# Patient Record
Sex: Male | Born: 1965 | Race: Black or African American | Hispanic: No | Marital: Married | Smoking: Never smoker
Health system: Southern US, Community
[De-identification: ages and names within clinical notes are randomized; demographics above are authoritative.]

## PROBLEM LIST (undated history)

## (undated) DIAGNOSIS — I10 Essential (primary) hypertension: Secondary | ICD-10-CM

---

## 1999-04-13 HISTORY — PX: DG HAND LEFT COMPLETE (ARMC HX): HXRAD1529

## 2007-01-06 ENCOUNTER — Other Ambulatory Visit: Payer: Self-pay

## 2007-01-06 ENCOUNTER — Emergency Department: Payer: Self-pay | Admitting: Emergency Medicine

## 2008-03-24 ENCOUNTER — Emergency Department: Payer: Self-pay | Admitting: Internal Medicine

## 2009-01-24 ENCOUNTER — Ambulatory Visit: Payer: Self-pay | Admitting: Internal Medicine

## 2013-12-10 ENCOUNTER — Emergency Department: Payer: Self-pay | Admitting: Emergency Medicine
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
## 2016-12-17 ENCOUNTER — Encounter: Payer: Self-pay | Admitting: Emergency Medicine

## 2016-12-17 ENCOUNTER — Emergency Department
Admission: EM | Admit: 2016-12-17 | Discharge: 2016-12-17 | Disposition: A | Discharge status: Home / Self Care | Payer: Self-pay | Attending: Emergency Medicine | Admitting: Emergency Medicine

## 2016-12-17 DIAGNOSIS — Y999 Unspecified external cause status: Secondary | ICD-10-CM | POA: Insufficient documentation

## 2016-12-17 DIAGNOSIS — Y939 Activity, unspecified: Secondary | ICD-10-CM | POA: Insufficient documentation

## 2016-12-17 DIAGNOSIS — S20461A Insect bite (nonvenomous) of right back wall of thorax, initial encounter: Secondary | ICD-10-CM | POA: Insufficient documentation

## 2016-12-17 DIAGNOSIS — I10 Essential (primary) hypertension: Secondary | ICD-10-CM | POA: Insufficient documentation

## 2016-12-17 DIAGNOSIS — R42 Dizziness and giddiness: Secondary | ICD-10-CM | POA: Insufficient documentation

## 2016-12-17 DIAGNOSIS — Y929 Unspecified place or not applicable: Secondary | ICD-10-CM | POA: Insufficient documentation

## 2016-12-17 DIAGNOSIS — Z9119 Patient's noncompliance with other medical treatment and regimen: Secondary | ICD-10-CM | POA: Insufficient documentation

## 2016-12-17 DIAGNOSIS — Z91199 Patient's noncompliance with other medical treatment and regimen due to unspecified reason: Secondary | ICD-10-CM

## 2016-12-17 DIAGNOSIS — W57XXXA Bitten or stung by nonvenomous insect and other nonvenomous arthropods, initial encounter: Secondary | ICD-10-CM | POA: Insufficient documentation

## 2016-12-17 HISTORY — DX: Essential (primary) hypertension: I10

## 2016-12-17 LAB — CBC
HEMATOCRIT: 52.4 % — AB (ref 40.0–52.0)
Hemoglobin: 18.3 g/dL — ABNORMAL HIGH (ref 13.0–18.0)
MCH: 31.7 pg (ref 26.0–34.0)
MCHC: 35 g/dL (ref 32.0–36.0)
MCV: 90.6 fL (ref 80.0–100.0)
Platelets: 207 10*3/uL (ref 150–440)
RBC: 5.78 MIL/uL (ref 4.40–5.90)
RDW: 13.8 % (ref 11.5–14.5)
WBC: 8.4 10*3/uL (ref 3.8–10.6)

## 2016-12-17 LAB — BASIC METABOLIC PANEL
Anion gap: 7 (ref 5–15)
BUN: 10 mg/dL (ref 6–20)
CHLORIDE: 103 mmol/L (ref 101–111)
CO2: 29 mmol/L (ref 22–32)
Calcium: 9.7 mg/dL (ref 8.9–10.3)
Creatinine, Ser: 1.17 mg/dL (ref 0.61–1.24)
GFR calc Af Amer: >60 mL/min (ref >60)
GFR calc non Af Amer: >60 mL/min (ref >60)
GLUCOSE: 101 mg/dL — AB (ref 65–99)
POTASSIUM: 4.1 mmol/L (ref 3.5–5.1)
Sodium: 139 mmol/L (ref 135–145)

## 2016-12-17 MED ORDER — HYDROXYZINE HCL 25 MG PO TABS: 25.0000 mg | ORAL_TABLET | Freq: Three times a day (TID) | ORAL | 0 refills | Status: AC | PRN

## 2016-12-17 MED ORDER — HYDROCHLOROTHIAZIDE 25 MG PO TABS: 25.0000 mg | ORAL_TABLET | Freq: Every day | ORAL | 0 refills | Status: AC

## 2016-12-17 MED ORDER — CEPHALEXIN 500 MG PO CAPS: 500.0000 mg | ORAL_CAPSULE | Freq: Three times a day (TID) | ORAL | 0 refills | Status: AC

## 2016-12-17 MED ORDER — TRIAMCINOLONE ACETONIDE 0.1 % EX CREA: TOPICAL_CREAM | CUTANEOUS | 0 refills | Status: AC

## 2016-12-17 NOTE — ED Provider Notes (Signed)
[facility] Emergency Department Provider Note  ____________________________________________   First MD Initiated Contact with Patient 12/17/16 1331     (approximate)  I have reviewed the triage vital signs and the nursing notes.   HISTORY  Chief Complaint Insect Bite and Hypertension    HPI Daniel Daugherty is a 51 y.o. male is here with complaint of pulling a tick off his back last week. Patient states that a friend took a. These are simple the tick off. He kept the tick and is looked at it under a microscope and it appears that the tick was taken out intact. Patient is concerned because he has a scabbed area on his back and also some redness in that area. He denies any headache, fever, joint aches or other rashes. Patient was noted to have had elevated blood pressure while in triage. Patient states that he was diagnosed with hypertension in 2004 when he was incarcerated but since that time has not taken any blood pressure medication and has not continued his diagnosis of hypertension. He states that he was seen by multiple doctors while he was trying to get disability and was not told that he has hypertension. Currently he rates his pain at the tick site as a 6 out of 10. He also states that it itches causing him to scratch at it.   Past Medical History:  Diagnosis Date  . Hypertension     There are no active problems to display for this patient.   History reviewed. No pertinent surgical history.  Prior to Admission medications   Medication Sig Start Date End Date Taking? Authorizing Provider  cephALEXin (KEFLEX) 500 MG capsule Take 1 capsule (500 mg total) by mouth 3 (three) times daily. 12/17/16   Tommi RumpsSummers, Rakwon Letourneau L, PA-C  hydrochlorothiazide (HYDRODIURIL) 25 MG tablet Take 1 tablet (25 mg total) by mouth daily. 12/17/16   Tommi RumpsSummers, Claudy Abdallah L, PA-C  hydrOXYzine (ATARAX/VISTARIL) 25 MG tablet Take 1 tablet (25 mg total) by mouth 3 (three) times daily as needed.  12/17/16   Tommi RumpsSummers, Shonique Pelphrey L, PA-C  triamcinolone cream (KENALOG) 0.1 % Apply to area twice a day as needed for itching. 12/17/16   Tommi RumpsSummers, Daysen Gundrum L, PA-C    Allergies Patient has no known allergies.  History reviewed. No pertinent family history.  Social History Social History  Substance Use Topics  . Smoking status: Never Smoker  . Smokeless tobacco: Never Used  . Alcohol use 3.6 oz/week    6 Cans of beer per week    Review of Systems  Constitutional: No fever/chills Cardiovascular: Denies chest pain. Respiratory: Denies shortness of breath. Gastrointestinal:   No nausea, no vomiting.  Musculoskeletal: Negative for back pain. Skin: Positive for tick bite, erythema, itching. Neurological: Negative for headaches, focal weakness or numbness.   ____________________________________________   PHYSICAL EXAM:  VITAL SIGNS: ED Triage Vitals [12/17/16 1126]  Enc Vitals Group     BP (!) 181/114     Pulse Rate 81     Resp 16     Temp 99.1 F (37.3 C)     Temp Source Oral     SpO2 98 %     Weight 198 lb (89.8 kg)     Height 5\' 6"  (1.676 m)     Head Circumference      Peak Flow      Pain Score      Pain Loc      Pain Edu?      Excl. in  GC?     Constitutional: Alert and oriented. Well appearing and in no acute distress. Eyes: Conjunctivae are normal. Head: Atraumatic. Nose: No congestion/rhinnorhea. Neck: No stridor.   Cardiovascular: Normal rate, regular rhythm. Grossly normal heart sounds.  Good peripheral circulation. Respiratory: Normal respiratory effort.  No retractions. Lungs CTAB. Gastrointestinal: Soft and nontender. No distention.  Musculoskeletal: Moves upper and lower extremities without any difficulty. Normal gait was noted. Neurologic:  Normal speech and language. No gross focal neurologic deficits are appreciated. No gait instability. Skin:  Skin is warm, dry.  Right posterior trunk area has a small 1.5 cm scabbed area with erythema surrounding it.  Patient does have tick present with him and this was examined and found to have all parts present. Area does not appear to be a tick related rash but more cellulitis from patient having tick removed. There is no active drainage. There is no soft tissue swelling present. No other rashes were noted. Psychiatric: Mood and affect are normal. Speech and behavior are normal.  ____________________________________________   LABS (all labs ordered are listed, but only abnormal results are displayed)  Labs Reviewed  BASIC METABOLIC PANEL - Abnormal; Notable for the following:       Result Value   Glucose, Bld 101 (*)    All other components within normal limits  CBC - Abnormal; Notable for the following:    Hemoglobin 18.3 (*)    HCT 52.4 (*)    All other components within normal limits  URINALYSIS, COMPLETE (UACMP) WITH MICROSCOPIC  CBG MONITORING, ED   ____________________________________________  EKG Normal sinus rhythm with a ventricular rate of 85. Possible left atrial enlargement. PR interval 122, QRS duration 88.   EKG was also reviewed by Dr. Mayford Knife.   PROCEDURES  Procedure(s) performed: None  Procedures  Critical Care performed: No  ____________________________________________   INITIAL IMPRESSION / ASSESSMENT AND PLAN / ED COURSE  Pertinent labs & imaging results that were available during my care of the patient were reviewed by me and considered in my medical decision making (see chart for details).  Repeat blood pressure was 180/100. We discussed this and patient denies any history of hypertension and feels that it is his tick bite that is causing his elevated blood pressure. He refuses treatment for his hypertension. He also implies that he has not feel that he needs to follow up with any doctor about his blood pressure. He again relates that in 2004 he did not have hypertension. Patient was told that he would be given a prescription for hydrochlorothiazide to take 1  daily and to also follow-up with one of the many clinics  available [facility]. Patient was given Keflex 500 mg for localized  skin infection related to his tick bite. He was also given Atarax for itching and Kenalog cream to apply to the area.    ____________________________________________   FINAL CLINICAL IMPRESSION(S) / ED DIAGNOSES  Final diagnoses:  Tick bite, initial encounter  Elevated blood pressure reading with diagnosis of hypertension  Non-compliant patient      NEW MEDICATIONS STARTED DURING THIS VISIT:  Discharge Medication List as of 12/17/2016  2:55 PM    START taking these medications   Details  cephALEXin (KEFLEX) 500 MG capsule Take 1 capsule (500 mg total) by mouth 3 (three) times daily., Starting Thu 12/17/2016, Print    hydrochlorothiazide (HYDRODIURIL) 25 MG tablet Take 1 tablet (25 mg total) by mouth daily., Starting Thu 12/17/2016, Print  hydrOXYzine (ATARAX/VISTARIL) 25 MG tablet Take 1 tablet (25 mg total) by mouth 3 (three) times daily as needed., Starting Thu 12/17/2016, Print    triamcinolone cream (KENALOG) 0.1 % Apply to area twice a day as needed for itching., Print         Note:  This document was prepared using Dragon voice recognition software and may include unintentional dictation errors.    Tommi Rumps, PA-C 12/17/16 1645    Sharman Cheek, MD 12/20/16 [phone]

## 2016-12-17 NOTE — ED Notes (Signed)
See triage note  States he developed some dizziness and just not feeling well since a tick bite last week  Denies any fever or rash  Afebrile on arrival

## 2016-12-17 NOTE — Discharge Instructions (Signed)
Follow-up with one of the many clinics listed on your paper. [facility] also available to [facility] and is free. Begin taking Keflex 500 mg 3 times a day for skin infection. Atarax 25 mg 3 times a day as needed for itching.Use cream to apply topically to the tick bite for itching. Also hydrochlorothiazide 25 mg one daily for blood pressure issues. Your  blood pressure is high today. Blood pressure today was 180/110. This is too high not to be considered hypertension. He should follow-up with one of clinics listed on your discharge papers. Information given to you today regarding hypertension and the complications from not treating it. Call tomorrow to see about scheduling an appointment for recheck of your blood pressure.

## 2016-12-17 NOTE — ED Triage Notes (Signed)
Pt c/o weakness since pulling a tick off last week. The area is scabbed and red. Pt has the tick with him. Pt also is hypertensive and is not on medications. Dx in 2004 with htn

## 2016-12-17 NOTE — ED Notes (Signed)

## 2017-05-22 ENCOUNTER — Emergency Department
Admission: EM | Admit: 2017-05-22 | Discharge: 2017-05-22 | Disposition: A | Discharge status: Home / Self Care | Payer: Self-pay | Attending: Emergency Medicine | Admitting: Emergency Medicine

## 2017-05-22 DIAGNOSIS — I10 Essential (primary) hypertension: Secondary | ICD-10-CM | POA: Insufficient documentation

## 2017-05-22 DIAGNOSIS — Z79899 Other long term (current) drug therapy: Secondary | ICD-10-CM | POA: Insufficient documentation

## 2017-05-22 DIAGNOSIS — L235 Allergic contact dermatitis due to other chemical products: Secondary | ICD-10-CM | POA: Insufficient documentation

## 2017-05-22 MED ORDER — HYDROCHLOROTHIAZIDE 25 MG PO TABS: 25.0000 mg | ORAL_TABLET | Freq: Every day | ORAL | 2 refills | Status: AC

## 2017-05-22 MED ORDER — METHYLPREDNISOLONE 4 MG PO TBPK: ORAL_TABLET | ORAL | 0 refills | Status: AC

## 2017-05-22 MED ORDER — CLONIDINE HCL 0.1 MG PO TABS
0.2000 mg | ORAL_TABLET | Freq: Once | ORAL | Status: AC
  Administered 2017-05-22: 0.2 mg via ORAL
  Filled 2017-05-22: qty 2

## 2017-05-22 MED ORDER — HYDROXYZINE HCL 50 MG PO TABS
50.0000 mg | ORAL_TABLET | Freq: Once | ORAL | Status: AC
  Administered 2017-05-22: 50 mg via ORAL
  Filled 2017-05-22: qty 1

## 2017-05-22 MED ORDER — METHYLPREDNISOLONE SODIUM SUCC 125 MG IJ SOLR
125.0000 mg | Freq: Once | INTRAMUSCULAR | Status: AC
  Administered 2017-05-22: 125 mg via INTRAMUSCULAR
  Filled 2017-05-22: qty 2

## 2017-05-22 MED ORDER — HYDROXYZINE HCL 50 MG PO TABS: 50.0000 mg | ORAL_TABLET | Freq: Three times a day (TID) | ORAL | 0 refills | Status: AC | PRN

## 2017-05-22 NOTE — ED Notes (Signed)
Pt noted to be agitated at this time, states he wants his D/C paperwork. This RN explained that we were still waiting on his son to arrive to verify a safe ride. Pt states that he is going to use the bathroom and then he is leaving, pt walked back to room. This RN discussed with PA who would like to visualize a safe ride, pt verified prior to medication administration that he would have a ride, and earlier after administration he stated he would have a ride. Per charge, pt could be discharged to lobby and sat in front of police officer due to patient not being able to drive due to receiving sedating medication Atarax.

## 2017-05-22 NOTE — ED Notes (Signed)
PA at bedside at this time to speak with patient regarding ride and safe discharge.

## 2017-05-22 NOTE — ED Notes (Signed)
Pt escorted back to room by this RN and Elonda Huskyassandra, Charity fundraiserN. Pt initially went lobby stating his son was here with Elonda Huskyassandra, Charity fundraiserN. This RN explained that patient had not received D/C papers or signed D/C instructions. Pt voluntarily walked back to room with this RN and Elonda Huskyassandra, Charity fundraiserN.

## 2017-05-22 NOTE — ED Provider Notes (Signed)
[facility] Emergency Department Provider Note   ____________________________________________   First MD Initiated Contact with Patient 05/22/17 0813     (approximate)  I have reviewed the triage vital signs and the nursing notes.   HISTORY  Chief Complaint Rash (Scattered, arms, chect and legs, duration 1 week)    HPI Daniel Daugherty is a 51 y.o. male patient presents with macular rash anteriorly upper and lower extremities and abdomen. States symptoms itching with the rash. Patient state he is to expose him to different chemicals he did not know which one is causing a rash. Patient also presents with elevated blood pressure secondary to noncompliance hypertension medication. Patient was seen earlier this year for elevated blood pressure and prescribed hydrochlorothiazide and [facility]. Patient follow-up (out of medications. Patient no relief with Benadryl and topical anti-itch medication.  Past Medical History:  Diagnosis Date  . Hypertension     There are no active problems to display for this patient.   Past Surgical History:  Procedure Laterality Date  . DG HAND LEFT COMPLETE ([facility] HX) Left 04/13/1999    Prior to Admission medications   Medication Sig Start Date End Date Taking? Authorizing Provider  cephALEXin (KEFLEX) 500 MG capsule Take 1 capsule (500 mg total) by mouth 3 (three) times daily. 12/17/16   Tommi Rumps, PA-C  hydrochlorothiazide (HYDRODIURIL) 25 MG tablet Take 1 tablet (25 mg total) by mouth daily. 12/17/16   Tommi Rumps, PA-C  hydrochlorothiazide (HYDRODIURIL) 25 MG tablet Take 1 tablet (25 mg total) by mouth daily. 05/22/17   Joni Reining, PA-C  hydrOXYzine (ATARAX/VISTARIL) 25 MG tablet Take 1 tablet (25 mg total) by mouth 3 (three) times daily as needed. 12/17/16   Tommi Rumps, PA-C  hydrOXYzine (ATARAX/VISTARIL) 50 MG tablet Take 1 tablet (50 mg total) by mouth 3 (three) times daily  as needed. 05/22/17   Joni Reining, PA-C  methylPREDNISolone (MEDROL DOSEPAK) 4 MG TBPK tablet Take Tapered dose as directed 05/22/17   Joni Reining, PA-C  triamcinolone cream (KENALOG) 0.1 % Apply to area twice a day as needed for itching. 12/17/16   Tommi Rumps, PA-C    Allergies Patient has no known allergies.  History reviewed. No pertinent family history.  Social History Social History  Substance Use Topics  . Smoking status: Never Smoker  . Smokeless tobacco: Never Used  . Alcohol use 3.6 oz/week    6 Cans of beer per week    Review of Systems Constitutional: No fever/chills Eyes: No visual changes. ENT: No sore throat. Cardiovascular: Denies chest pain. Respiratory: Denies shortness of breath. Gastrointestinal: No abdominal pain.  No nausea, no vomiting.  No diarrhea.  No constipation. Genitourinary: Negative for dysuria. Musculoskeletal: Negative for back pain. Skin: Positive for rash. Neurological: Negative for headaches, focal weakness or numbness. Endocrine:Hypertension  ____________________________________________   PHYSICAL EXAM:  VITAL SIGNS: ED Triage Vitals [05/22/17 0804]  Enc Vitals Group     BP (!) 178/111     Pulse Rate (!) 103     Resp 18     Temp 98 F (36.7 C)     Temp Source Oral     SpO2 98 %     Weight      Height      Head Circumference      Peak Flow      Pain Score      Pain Loc      Pain  Edu?      Excl. in GC?     Constitutional: Alert and oriented. Well appearing and in no acute distress. Cardiovascular: Normal rate, regular rhythm. Grossly normal heart sounds.  Good peripheral circulation. Elevated blood pressure. Respiratory: Normal respiratory effort.  No retractions. Lungs CTAB. Neurologic:  Normal speech and language. No gross focal neurologic deficits are appreciated. No gait instability. Skin:  Skin is warm, dry and intact. Diffuse macular lesions Psychiatric: Mood and affect are normal. Speech and  behavior are normal.  ____________________________________________   LABS (all labs ordered are listed, but only abnormal results are displayed)  Labs Reviewed - No data to display ____________________________________________  EKG   ____________________________________________  RADIOLOGY  No results found.  ____________________________________________   PROCEDURES  Procedure(s) performed: None  Procedures  Critical Care performed: No  ____________________________________________   INITIAL IMPRESSION / ASSESSMENT AND PLAN / ED COURSE  As part of my medical decision making, I reviewed the following data within the electronic MEDICAL RECORD NUMBER    Patient's present with one week of macular lesions anterior aspect of the upper/  lower extremities and trunk area. Patient also has has elevated blood pressure to secondary to noncompliance of medications. Patient given Solu Medrol, Atarax, and Catapres and will have blood pressure retaken after one hour and prior to departure. Patient given discharge care instructions and advised [facility].      ____________________________________________   FINAL CLINICAL IMPRESSION(S) / ED DIAGNOSES  Final diagnoses:  Allergic dermatitis due to other chemical product  Essential hypertension      NEW MEDICATIONS STARTED DURING THIS VISIT:  New Prescriptions   HYDROCHLOROTHIAZIDE (HYDRODIURIL) 25 MG TABLET    Take 1 tablet (25 mg total) by mouth daily.   HYDROXYZINE (ATARAX/VISTARIL) 50 MG TABLET    Take 1 tablet (50 mg total) by mouth 3 (three) times daily as needed.   METHYLPREDNISOLONE (MEDROL DOSEPAK) 4 MG TBPK TABLET    Take Tapered dose as directed     Note:  This document was prepared using Dragon voice recognition software and may include unintentional dictation errors.    Joni ReiningSmith, Mate Alegria K, PA-C 05/22/17 [phone]    Dionne BucySiadecki, Sebastian, MD 05/22/17 [phone]

## 2017-05-22 NOTE — ED Notes (Signed)
This RN to bedside, asked patient when his son would arrive. Pt states that he does not understanding why he is being dx with high blood pressure and he does not understanding about his high blood pressure medication. This RN attempted to explain that upon exam his blood pressure was noted to be elevated. This RN also explained as a follow up to PA explanation that uncontrolled high blood pressure would need follow up with PCP and take his HTN that he was being prescribed. Pt states that we "have not made him feel any better than he did before he came in and have done nothing to overall improve his health". This RN explained that the ER was for emergent illnesses and that we treated the rash that he was seen for, but that "overall improving his health" would need follow up with PCP and taking his HTN medications prescribed. Pt then became agitated and told this RN, "I pray that you never get sick and never have anything wrong with you." This RN updated PA to patient condition.

## 2017-05-22 NOTE — ED Triage Notes (Signed)
Patient has scattered rash covers arms, legs, and abdomen, reportedly started about a week ago, reports itching, and overall discomfort. Un treated hypertension, provider notified.

## 2017-05-22 NOTE — ED Notes (Signed)
Pt was d/c with son. RN educated the pt and son on the Importance to the medications. Pt was not acceptable but son was. Pt is ambulatory and A/O, pt left with son.

## 2017-05-22 NOTE — ED Notes (Signed)
Walked with Pt out to waiting room to await son arrival for pt to be discharge. Pt is very aggravated and aggitated, this RN stated to pt that we were only here for safety. Rn stood with officer as she explained why we were with him. Megan RN came out to waiting area and PT agreed voluntary to return to room to await sons arrival.

## 2020-03-11 ENCOUNTER — Ambulatory Visit: Payer: Self-pay | Attending: Internal Medicine

## 2020-03-11 DIAGNOSIS — Z23 Encounter for immunization: Secondary | ICD-10-CM

## 2020-03-11 NOTE — Progress Notes (Signed)
   Covid-19 Vaccination Clinic  Name:  Daniel Daugherty    MRN: [id number] DOB: 01-Oct-1965  03/11/2020  Mr. Daniel Daugherty was observed post Covid-19 immunization for 15 minutes without incident. He was provided with Vaccine Information Sheet and instruction to access the V-Safe system.   Mr. Daniel Daugherty was instructed to call 911 with any severe reactions post vaccine: Marland Kitchen Difficulty breathing  . Swelling of face and throat  . A fast heartbeat  . A bad rash all over body  . Dizziness and weakness   Immunizations Administered    Name Date Dose VIS Date Route   Pfizer COVID-19 Vaccine 03/11/2020  2:53 PM 0.3 mL 09/27/2018 Intramuscular   Manufacturer: [employer], [employer]   Lot: [id number]   NDC: [address]-7

## 2020-04-01 ENCOUNTER — Ambulatory Visit: Payer: Self-pay | Attending: Internal Medicine

## 2020-04-01 DIAGNOSIS — Z23 Encounter for immunization: Secondary | ICD-10-CM

## 2020-04-01 NOTE — Progress Notes (Signed)
   Covid-19 Vaccination Clinic  Name:  Daniel Daugherty    MRN: [id number] DOB: 03-04-1966  04/01/2020  Mr. Daniel Daugherty was observed post Covid-19 immunization for 15 minutes without incident. He was provided with Vaccine Information Sheet and instruction to access the V-Safe system.   Mr. Daniel Daugherty was instructed to call 911 with any severe reactions post vaccine: Marland Kitchen Difficulty breathing  . Swelling of face and throat  . A fast heartbeat  . A bad rash all over body  . Dizziness and weakness   Immunizations Administered    Name Date Dose VIS Date Route   Pfizer COVID-19 Vaccine 04/01/2020  1:56 PM 0.3 mL 09/27/2018 Intramuscular   Manufacturer: [employer], [employer]   Lot: [id number]   NDC: [address]-0

## 2023-09-20 ENCOUNTER — Ambulatory Visit: Payer: 59 | Admitting: Physical Therapy

## 2023-09-20 ENCOUNTER — Ambulatory Visit: Payer: 59 | Admitting: Occupational Therapy

## 2023-09-22 ENCOUNTER — Ambulatory Visit: Payer: 59

## 2023-09-23 ENCOUNTER — Ambulatory Visit: Payer: 59 | Admitting: Occupational Therapy

## 2023-09-23 ENCOUNTER — Ambulatory Visit: Payer: 59 | Admitting: Physical Therapy

## 2023-09-28 ENCOUNTER — Encounter: Payer: Self-pay | Admitting: Physical Therapy

## 2023-09-28 ENCOUNTER — Other Ambulatory Visit: Payer: Self-pay

## 2023-09-28 ENCOUNTER — Ambulatory Visit: Payer: 59 | Admitting: Occupational Therapy

## 2023-09-28 ENCOUNTER — Ambulatory Visit: Payer: 59 | Attending: Family Medicine | Admitting: Physical Therapy

## 2023-09-28 DIAGNOSIS — M6281 Muscle weakness (generalized): Secondary | ICD-10-CM | POA: Diagnosis present

## 2023-09-28 DIAGNOSIS — R531 Weakness: Secondary | ICD-10-CM | POA: Insufficient documentation

## 2023-09-28 DIAGNOSIS — R2681 Unsteadiness on feet: Secondary | ICD-10-CM | POA: Insufficient documentation

## 2023-09-28 DIAGNOSIS — R269 Unspecified abnormalities of gait and mobility: Secondary | ICD-10-CM | POA: Diagnosis present

## 2023-09-28 DIAGNOSIS — R278 Other lack of coordination: Secondary | ICD-10-CM | POA: Insufficient documentation

## 2023-09-28 NOTE — Therapy (Addendum)
 OUTPATIENT OCCUPATIONAL THERAPY NEURO EVALUATION  Patient Name: Daniel Daugherty MRN: [id number] DOB:02/28/1966, 58 y.o., male Today's Date: 09/28/2023  PCP: N/A REFERRING PROVIDER: Norval Morton, MD  END OF SESSION:  OT End of Session - 09/28/23 0941     Visit Number 1    Number of Visits 24    Date for OT Re-Evaluation 12/21/23    OT Start Time 0930    OT Stop Time 1015    OT Time Calculation (min) 45 min    Activity Tolerance Patient tolerated treatment well    Behavior During Therapy WFL for tasks assessed/performed             Past Medical History:  Diagnosis Date   Hypertension    Past Surgical History:  Procedure Laterality Date   DG HAND LEFT COMPLETE ([facility] HX) Left 04/13/1999   There are no active problems to display for this patient.   ONSET DATE: 05/2021  REFERRING DIAG: Parkinson's Disease  THERAPY DIAG:  Muscle weakness (generalized)  Other lack of coordination  Rationale for Evaluation and Treatment: Rehabilitation  SUBJECTIVE:   SUBJECTIVE STATEMENT: Pt. reports using transportation to therapy today. Pt accompanied by: self  PERTINENT HISTORY:   Pt. is a 58 y.o. male who was diagnosed with Parkinson's Disease in 2022. Pt. reports having had a progressive decline in function since having hernia surgery, and right shoulder rotator cuff surgery. PMHx included: HTN. Of note; Pt. was being followed by [facility], and receiving Home health therapy services. Pt. changed physicians, and was referred for outpatient rehab services here.  PRECAUTIONS: None  WEIGHT BEARING RESTRICTIONS: No  PAIN:  Are you having pain? 3-4 right shoulder, and low back. Pt. Has a history of a right shoulder rotator cuff injury/repair  FALLS: Has patient fallen in last 6 months? No, 1 small slip near the bed tripped over shoes  LIVING ENVIRONMENT: Lives with: lives alone Lives in: [place],  Stairs: Entry level Has following equipment at home: Quad cane small  base, rollator, shower chair, grab bars, hand rail at [employer]  PLOF: Independent  PATIENT GOALS: Improve self-care  OBJECTIVE:  Note: Objective measures were completed at Evaluation unless otherwise noted.  HAND DOMINANCE: Right  ADLs:  Assist at home:  Ex wife, and children assist several times a week  Transfers/ambulation related to ADLs: Modified Independent Eating: Difficulty using dominant right hand. Drops food when scooping, Both hands for stabilizing drinks, and uses a straw. Unable to cut food.  Grooming: Increased time required for all grooming tasks. UB Dressing: Difficulty managing buttons LB Dressing: Less assist with pants with elastic, more assist with dress pants Toileting: Increased time for hygiene. Bathing: Requires increased time to complete Tub Shower transfers: Modified Independence with increased time  IADLs: Shopping: Son assists with grocery shopping Light housekeeping: Son's assist. Pt. Does light rising of dishes off. Meal Prep: Increased time. Independent with microwave, and air fryer. Difficulty opening bottles, containers Community mobility:  Transportation, light driving to grocery store Medication management:  Son's set-up weekly pillbox, Pt. Is responsible for taking the medications Financial management: Son's assist wit monthly finances Handwriting: 50% legible Hobbies: Dancing, watching sports Career/work: Restaurant manager, fast food  MOBILITY STATUS: Uses a cane; shuffling gait  FUNCTIONAL OUTCOME MEASURES:  TBD  UPPER EXTREMITY ROM:    Active ROM Right eval Left Eval WFL overall  Shoulder flexion [facility]   Shoulder abduction 52(100)   Shoulder adduction    Shoulder extension    Shoulder internal rotation  Shoulder external rotation    Elbow flexion WFL   Elbow extension WFL   Wrist flexion    Wrist extension WFL   Wrist ulnar deviation    Wrist radial deviation    Wrist pronation    Wrist supination    (Blank rows = not  tested)  UPPER EXTREMITY MMT:     MMT Right eval Left Eval 4-/5 overall  Shoulder flexion 3/5   Shoulder abduction 2/5   Shoulder adduction    Shoulder extension    Shoulder internal rotation    Shoulder external rotation    Middle trapezius    Lower trapezius    Elbow flexion 3/5   Elbow extension 3/5   Wrist flexion    Wrist extension 3/5   Wrist ulnar deviation    Wrist radial deviation    Wrist pronation    Wrist supination    (Blank rows = not tested)  HAND FUNCTION: TBD Grip strength: Right:   lbs; Left:   lbs, Lateral pinch: Right:   lbs, Left:   lbs, 3 point pinch: Right:   lbs, Left:   lbs, and Tip pinch: Right   lbs, Left:   lbs  COORDINATION: TBD 9 Hole Peg test: Right:   sec; Left:   sec  SENSATION: TBD Light touch:  Proprioception:  EDEMA: N/A  COGNITION: Overall cognitive status: Within functional limits for tasks assessed  VISION:  Does not wear glasses.  VISION ASSESSMENT: To be further assessed in functional context  PERCEPTION: WFL  PRAXIS: Impaired: Motor planning                                                                                                                         TREATMENT DATE: 09/28/23  Initial evaluation complete   PATIENT EDUCATION: Education details: OT services, POC, goals, and ADL functional status Person educated: Patient Education method: Explanation, Demonstration, Tactile cues, and Verbal cues Education comprehension: needs further education  HOME EXERCISE PROGRAM:  Continue to assess, and provide as indicated.   GOALS: Goals reviewed with patient? Yes  SHORT TERM GOALS: Target date: 11/09/2023    Pt. Will be independent with HEPs for RUE. Baseline: Eval: No current HEP Goal status: INITIAL  LONG TERM GOALS: Target date: 12/21/2023  1.  Pt. Will increase right shoulder abduction by 10 degrees to assist with UE dressing Baseline: Eval: Right 52(100) Goal status: INITIAL  2.  Pt. Will increase  BUE strength by 2 mm grades to assist with ADLs. Baseline: Eval: Right: shoulder flexion: 3/5, abduction: 2/5, elbow flexion/extension: 3/5, wrist extension: 3/5. Left: 4-/5 overall Goal status: INITIAL  3.  Pt. Will perform self-feeding with modified independence Baseline: Eval: Difficulty using dominant right hand. Drops food when scooping, Both hands for stabilizing drinks, and uses a straw. Unable to cut food. Goal status: INITIAL  4.  Pt. Will demonstrate independence with proper A/E techniques/compensatory strategies for ADL/IADLs.  Baseline: Eval: Education to be provided Goal status: INITIAL  5.  Pt. Will write a sentence with 75% legibility with modified independence Baseline: Eval: 50% legibility for name only. Goal status: INITIAL   ASSESSMENT:  CLINICAL IMPRESSION:  Patient is a 58 y.o. male who was seen today for occupational therapy evaluation for Parkinson's Disease. Pt. presents with limited ROM with abduction, BUE strength, limited coordination 2/2 tremors, and 3-4/10 pain in the right shoulder, bilateral neck, and low back which limits his ability to complete basic all  ADL, and IADL tasks efficiently. Pt. will benefit from OT services to work on improving bilateral UE functioning in order to maximize engagement in, and efficiency in ADLs, and IADL tasks, and provide education about compensatory strategies.   PERFORMANCE DEFICITS: in functional skills including ADLs, IADLs, coordination, dexterity, proprioception, sensation, ROM, strength, pain, Fine motor control, Gross motor control, and UE functional use, cognitive skills including , and psychosocial skills including coping strategies, environmental adaptation, and routines and behaviors.   IMPAIRMENTS: are limiting patient from ADLs, IADLs, and leisure.   CO-MORBIDITIES: may have co-morbidities  that affects occupational performance. Patient will benefit from skilled OT to address above impairments and improve overall  function.  MODIFICATION OR ASSISTANCE TO COMPLETE EVALUATION: Min-Moderate modification of tasks or assist with assess necessary to complete an evaluation.  OT OCCUPATIONAL PROFILE AND HISTORY: Detailed assessment: Review of records and additional review of physical, cognitive, psychosocial history related to current functional performance.  CLINICAL DECISION MAKING: Moderate - several treatment options, min-mod task modification necessary  REHAB POTENTIAL: Good  EVALUATION COMPLEXITY: Moderate    PLAN:  OT FREQUENCY: 2x/week  OT DURATION: 12 weeks  PLANNED INTERVENTIONS: 97168 OT Re-evaluation, 97535 self care/ADL training, 40102 therapeutic exercise, 97530 therapeutic activity, 97112 neuromuscular re-education, 97140 manual therapy, 97018 paraffin, 72536 moist heat, 97034 contrast bath, 97760 Orthotics management and training, 64403 Splinting (initial encounter), energy conservation, patient/family education, and DME and/or AE instructions  RECOMMENDED OTHER SERVICES:   PT  CONSULTED AND AGREED WITH PLAN OF CARE: Patient  PLAN FOR NEXT SESSION:   Treatment  Olegario Messier, MS, OTR/L 09/28/2023, 9:47 AM

## 2023-09-28 NOTE — Therapy (Unsigned)
 OUTPATIENT PHYSICAL THERAPY NEURO EVALUATION   Patient Name: Daniel Daugherty MRN: [id number] DOB:Dec 21, 1965, 58 y.o., male Today's Date: 09/28/2023   PCP: Leanord Asal, Nelva Bush, MD  REFERRING PROVIDER: Leanord Asal, Nelva Bush, MD   END OF SESSION:   PT End of Session - 09/28/23 [phone]     Visit Number 1    Number of Visits 24    Date for PT Re-Evaluation 12/21/23    Authorization Type Medicare 2025    PT Start Time 1018    PT Stop Time 1104    PT Time Calculation (min) 46 min    Equipment Utilized During Treatment Gait belt    Activity Tolerance Patient tolerated treatment well    Behavior During Therapy WFL for tasks assessed/performed             Past Medical History:  Diagnosis Date   Hypertension    Past Surgical History:  Procedure Laterality Date   DG HAND LEFT COMPLETE ([facility] HX) Left 04/13/1999   There are no active problems to display for this patient.   ONSET DATE: 2022 is when he noticed the change and that was around the same time he was diagnosed with Parkinson's  REFERRING DIAG:  G20.A1 (ICD-10-CM) - Parkinson disease (HCC)  Z74.1 (ICD-10-CM) - Requires daily assistance for activities of daily living (ADL) and comfort needs   THERAPY DIAG:  Abnormality of gait  Unsteadiness on feet  Generalized weakness  Other lack of coordination  Rationale for Evaluation and Treatment: Rehabilitation  SUBJECTIVE:                                                                                                                                                                                             SUBJECTIVE STATEMENT: Pt reports he was doing HH therapy, but he changed physicians and they recommended coming to OP therapy here. Patient reports having R should surgery in 2022 for his rotator cuff and that he went "downhill after that" with a decline in his mobility. Reports R side is more effected.   Pt accompanied by: self  PERTINENT HISTORY: Parkinsonism,  Vitamin D deficiency, Hyperlipidemia, HTN, poor medication compliance (per chart review)  PAIN:  Are you having pain? Yes: NPRS scale: 5/10, but states "it ain't that bad" Pain location: R shoulder and low back Pain description: throbbing in shoulder Aggravating factors: overhead movements or reaching back Relieving factors: rest and at home uses a "massager"  PRECAUTIONS: Fall  RED FLAGS: None   WEIGHT BEARING RESTRICTIONS: No  FALLS: Has patient fallen in last 6 months? Yes. 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   PATIENT GOALS: Get my balance right with my walking, help get my R arm stronger and be able to move it more, improve strength in my legs (specifically the Right)  OBJECTIVE:  Note: Objective measures were completed at Evaluation unless otherwise noted.  DIAGNOSTIC FINDINGS: N/A  COGNITION: Overall cognitive status: Within functional limits for tasks assessed; however, did not assess higher level cognitive tasks   SENSATION: WFL Light touch on screen Pt reports R foot falls asleep sitting on toilet and he has difficult time getting feeling back in it before feeling safe standing up.  COORDINATION: Overall bradykinesia with decreased speed and amplitude of movements  EDEMA:  None present  MUSCLE TONE: Not formally assessed  MUSCLE LENGTH: Not formally assessed, but may have some  hamstring tightness on R associated with quad weakness and inability to achieve full knee extension in sitting  DTRs:  Not assessed  POSTURE: rounded shoulders, forward head, increased thoracic kyphosis, posterior pelvic tilt, and flexed trunk    LOWER EXTREMITY ROM:     Active ROM Right Eval Left Eval  Hip flexion Decreased in sitting compared to L   Hip extension    Hip abduction    Hip adduction    Hip internal rotation    Hip external rotation    Knee flexion    Knee extension Lacks full knee extension in sitting   Ankle dorsiflexion Limited in sitting Limited in sitting  Ankle plantarflexion    Ankle inversion    Ankle eversion     (Blank rows = not tested)  LOWER EXTREMITY MMT:    MMT Right Eval Left Eval  Hip flexion 3- 4+  Hip extension    Hip abduction    Hip adduction    Hip internal rotation    Hip external rotation    Knee flexion 3 4+  Knee extension 3- 4+  Ankle dorsiflexion 3- 4+  Ankle plantarflexion 3- 4+  Ankle inversion    Ankle eversion    (Blank rows = not tested)  Manual Muscle Test Scale 0/5 = No muscle contraction can be seen or felt 1/5 = Contraction can be felt, but there is no motion 2-/5 = Part moves through incomplete ROM w/ gravity decreased 2/5 = Part moves through complete ROM w/ gravity decreased 2+/5 = Part moves through incomplete ROM (<50%) against gravity or through complete ROM w/ gravity 3-/5 = Part moves through incomplete ROM (>50%) against gravity 3/5 = Part moves through complete ROM against gravity 3+/5 = Part moves through complete ROM against gravity/slight resistance 4-/5= Holds test position against slight to moderate pressure 4/5 = Part moves through complete ROM against gravity/moderate resistance 4+/5= Holds test position against moderate to strong pressure 5/5 = Part moves through complete ROM against gravity/full resistance  BED MOBILITY:  Sit to supine   Supine to sit   Need to assess  ADL: Reports  dificulty getitng on R shoe, specifically having trouble pushing his  foot down in his shoe  TRANSFERS: Assistive device utilized:  Hurry cane   Sit to stand: Modified independence and SBA Stand to sit: Modified independence and SBA Chair to chair: Modified independence and SBA Floor:  would benefit from testing in future  RAMP:  Level of Assistance:    Assistive device utilized:  Hurry cane Ramp Comments: would benefit from assessing  CURB:  Level of Assistance:    Assistive device utilized:  [employer] Comments: would benefit from assessing  STAIRS: Level of Assistance: CGA and Min A Stair Negotiation Technique: Step to Pattern Forwards with Bilateral Rails Number of Stairs: 4  Height of Stairs: 6in  Comments: leading with R LE in both directions, attempts reciprocal pattern on ascent with sufficient strength, but having uncontrolled anterior lean during descent  GAIT: Gait pattern: step through pattern, decreased arm swing- Right, decreased arm swing- Left, decreased step length- Right, decreased step length- Left, decreased stride length, decreased hip/knee flexion- Right, decreased hip/knee flexion- Left, decreased ankle dorsiflexion- Right, decreased ankle dorsiflexion- Left, decreased trunk rotation, trunk flexed, poor foot clearance- Right, and poor foot clearance- Left Distance walked: 166ft Assistive device utilized: None Level of assistance: CGA Comments: significantly decreased  FUNCTIONAL TESTS:  5 times sit to stand: 38.53 seconds arms across chest Timed up and go (TUG): 19.46 seconds without AD 6 minute walk test: need to assess 10 meter walk test: 0.56 m/s without AD Mini-Best: need to assess  PATIENT SURVEYS:  ABC scale 17.5%  with pt feeling <40% confident on all of the items, pt feels only 30% confident walking around the house, 10% confident getting in/out of car, and 20% reaching for items                                                                                                                                TREATMENT DATE: 09/28/23  Evaluation outcome measure details below, no interventions performed at time of initial eval.  Vitals sitting at beginning of session: BP 125/89 (MAP 101), HR 82bpm   Activities-specific Balance Confidence Scale:  Score: 17.5% Increased risk of falls in community-dwelling, older adults <80% (79.89%)  0% = no confidence - 100% = complete confidence (ANPTA Core Set of Outcome Measures for Adults with Neurologic Conditions, 2018)   Five times Sit to Stand Test (FTSS) "Stand up and sit down as quickly as possible 5 times, keeping your arms folded across your chest."    TIME: 38.53 seconds with hands across chest (requires a couple of attempts to fully stand)  Times > 13.6 seconds is associated with increased disability and morbidity (Guralnik, 2000) Times > 15 seconds is predictive of recurrent falls in healthy individuals aged 89 and older (Buatois, et al., 2008) Normal performance values in community dwelling individuals aged 56 and older (Bohannon, 2006): 60-69 years: 11.4 seconds 70-79 years: 12.6 seconds 80-89 years: 14.8 seconds  MCID: >= 2.3 seconds for Vestibular Disorders (Meretta, 2006)  10 Meter Walk Test: Patient instructed to walk 10 meters (32.8 ft) as quickly and as safely as possible at their normal speed Results: 0.56 m/s (18.08seconds and 17.39 seconds) without AD   Cut off scores:   Household Ambulator  < 0.4 m/s  Limited Community Ambulator  0.4 - 0.8 m/s  [employer]  > 0.8 m/s  Increased fall risk  < 1.24m/s  Crossing a Street  >1.26m/s  MCID 0.05 m/s (small), 0.13 m/s (moderate), 0.06 m/s (significant)  (ANPTA Core Set of Outcome Measures for Adults with Neurologic Conditions, 2018)   Participated in Timed Up and Go (TUG), without AD: 1st trial: 20.70 seconds 2nd trial: 18.21 seconds  Patient demonstrates high fall risk as indicated by requiring  >13.5seconds to complete the TUG.     PATIENT EDUCATION: Education details: PT POC, Goals, results of outcome measures indicating increased fall risk Person educated: Patient Education method: Explanation Education comprehension: verbalized understanding and needs further education  HOME EXERCISE PROGRAM: Need to initiate  GOALS: Goals reviewed with patient? Yes  SHORT TERM GOALS: Target date: 11/09/2023    Patient will be independent in home exercise program to improve strength/mobility for better functional independence with ADLs. Baseline: need to initiate Goal status: INITIAL   LONG TERM GOALS: Target date: 12/21/2023  1.  Patient (< 65 years old) will complete five times sit to stand test in < 12 seconds indicating an increased LE strength and improved balance. Baseline: 09/28/23: 38.53 seconds Goal status: INITIAL   2. Patient will reduce timed up and go to <11 seconds to reduce fall risk and demonstrate improved transfer/gait ability. Baseline: 09/28/23: 19.46 seconds without AD Goal status: INITIAL  3.  Patient will increase 10 meter walk test to >1.72m/s as to improve gait speed for better community ambulation and to reduce fall risk. Baseline: 09/28/23: 0.19m/s without AD Goal status: INITIAL  4.  Patient will increase six minute walk test distance to >1000 for progression to community ambulator and improve gait ability Baseline: need to assess Goal status: INITIAL  5. Patient will increase MiniBest Test score to >18/28 to indicate a reduced risk for falling and demonstrate increased independence with functional mobility and ADLs.  Baseline: need to assess  Goal status: INITIAL  6. Patient will increase ABC scale score >80% to demonstrate better functional mobility and better confidence with ADLs.   Baseline: 09/28/23: 17.5%  Goal status: INITIAL   ASSESSMENT:  CLINICAL IMPRESSION: Patient is a 58 y.o. male who was seen today for physical therapy evaluation and  treatment for R>L B LE weakness, impaired balance, and gait deviations due to Parkinson's disease. Mr. Narula presents with high risk of falling as noted by B LE strength deficits on MMT testing and noted functionally during 5xSTS test. He also demonstrates high fall risk requiring increased time to complete the TUG and significantly decreased gait speed indicating he is a limited community ambulator. Patient with greater strength deficits in R LE compared to L LE. Patient also with abnormal postural alignment with excessive thoracic kyphosis with forward trunk flexed posture impacting his balance. Patient also demonstrates bradykinesia with decreased speed and amplitude of movements. Mr. Weihe will benefit from further skilled PT to improve these deficits in order to increase QOL and ease/safety with ADLs as well as decrease risk for falls.   OBJECTIVE IMPAIRMENTS: Abnormal gait, decreased activity tolerance, decreased balance, decreased coordination, decreased endurance, decreased knowledge of condition, decreased knowledge of use of DME, decreased mobility, difficulty walking, decreased  ROM, decreased strength, decreased safety awareness, improper body mechanics, postural dysfunction, and pain.   ACTIVITY LIMITATIONS: carrying, lifting, bending, standing, squatting, sleeping, stairs, transfers, bed mobility, continence, bathing, toileting, dressing, reach over head, hygiene/grooming, and locomotion level  PARTICIPATION LIMITATIONS: meal prep, cleaning, laundry, and community activity  PERSONAL FACTORS: Fitness and 3+ comorbidities: Vitamin D deficiency, Hyperlipidemia, and HTN  are also affecting patient's functional outcome.   REHAB POTENTIAL: Good  CLINICAL DECISION MAKING: Evolving/moderate complexity  EVALUATION COMPLEXITY: Moderate  PLAN:  PT FREQUENCY: 1-2x/week  PT DURATION: 12 weeks  PLANNED INTERVENTIONS: 97164- PT Re-evaluation, 97110-Therapeutic exercises, 97530- Therapeutic  activity, 97112- Neuromuscular re-education, 97535- Self Care, 16109- Manual therapy, [phone]- Gait training, [phone]- Orthotic Fit/training, [phone]- Canalith repositioning, [phone]- Electrical stimulation (manual), Patient/Family education, Balance training, Stair training, Joint mobilization, Vestibular training, Visual/preceptual remediation/compensation, DME instructions, Cryotherapy, and Moist heat  PLAN FOR NEXT SESSION:  - 6 min walk test - MiniBest Test - initiate HEP  - assess bed mobility      Colbi Staubs, PT, DPT, NCS, CSRS Physical Therapist - [place]  [facility]  9:57 PM 09/28/23

## 2023-09-30 ENCOUNTER — Ambulatory Visit: Payer: 59 | Admitting: Physical Therapy

## 2023-10-01 ENCOUNTER — Ambulatory Visit: Payer: 59

## 2023-10-01 ENCOUNTER — Ambulatory Visit: Payer: 59 | Admitting: Physical Therapy

## 2023-10-04 ENCOUNTER — Ambulatory Visit: Payer: 59 | Attending: Family Medicine | Admitting: Occupational Therapy

## 2023-10-04 ENCOUNTER — Ambulatory Visit: Payer: 59 | Admitting: Physical Therapy

## 2023-10-04 DIAGNOSIS — R2681 Unsteadiness on feet: Secondary | ICD-10-CM | POA: Insufficient documentation

## 2023-10-04 DIAGNOSIS — M6281 Muscle weakness (generalized): Secondary | ICD-10-CM | POA: Diagnosis present

## 2023-10-04 DIAGNOSIS — R269 Unspecified abnormalities of gait and mobility: Secondary | ICD-10-CM | POA: Diagnosis present

## 2023-10-04 DIAGNOSIS — R278 Other lack of coordination: Secondary | ICD-10-CM

## 2023-10-04 DIAGNOSIS — R531 Weakness: Secondary | ICD-10-CM | POA: Insufficient documentation

## 2023-10-04 NOTE — Therapy (Addendum)
 OUTPATIENT OCCUPATIONAL THERAPY NEURO TREATMENT NOTE  Patient Name: Daniel Daugherty MRN: [id number] DOB:04-27-1966, 58 y.o., male Today's Date: 10/04/2023  PCP: N/A REFERRING PROVIDER: Norval Morton, MD  END OF SESSION:  OT End of Session - 10/04/23 0934     Visit Number 2    Number of Visits 24    Date for OT Re-Evaluation 12/21/23    OT Start Time 0930    OT Stop Time 1015    OT Time Calculation (min) 45 min    Activity Tolerance Patient tolerated treatment well    Behavior During Therapy WFL for tasks assessed/performed             Past Medical History:  Diagnosis Date   Hypertension    Past Surgical History:  Procedure Laterality Date   DG HAND LEFT COMPLETE ([facility] HX) Left 04/13/1999   There are no active problems to display for this patient.   ONSET DATE: 05/2021  REFERRING DIAG: Parkinson's Disease  THERAPY DIAG:  Muscle weakness (generalized)  Rationale for Evaluation and Treatment: Rehabilitation  SUBJECTIVE:   SUBJECTIVE STATEMENT: Pt. reports using transportation to therapy today. Pt accompanied by: self  PERTINENT HISTORY:   Pt. is a 58 y.o. male who was diagnosed with Parkinson's Disease in 2022. Pt. reports having had a progressive decline in function since having hernia surgery, and right shoulder rotator cuff surgery. PMHx included: HTN. Of note; Pt. was being followed by [facility], and receiving Home health therapy services. Pt. changed physicians, and was referred for outpatient rehab services here.  PRECAUTIONS: None  WEIGHT BEARING RESTRICTIONS: No  PAIN:  Are you having pain? 3/10 Neck, right shoulder. Pt. Has a history of a right shoulder rotator cuff injury/repair  FALLS: Has patient fallen in last 6 months? No, 1 small slip near the bed tripped over shoes  LIVING ENVIRONMENT: Lives with: lives alone Lives in: [place],  Stairs: Entry level Has following equipment at home: Quad cane small base, rollator, shower chair, grab  bars, hand rail at [employer]  PLOF: Independent  PATIENT GOALS: Improve self-care  OBJECTIVE:  Note: Objective measures were completed at Evaluation unless otherwise noted.  HAND DOMINANCE: Right  ADLs:  Assist at home:  Ex wife, and children assist several times a week  Transfers/ambulation related to ADLs: Modified Independent Eating: Difficulty using dominant right hand. Drops food when scooping, Both hands for stabilizing drinks, and uses a straw. Unable to cut food.  Grooming: Increased time required for all grooming tasks. UB Dressing: Difficulty managing buttons LB Dressing: Less assist with pants with elastic, more assist with dress pants Toileting: Increased time for hygiene. Bathing: Requires increased time to complete Tub Shower transfers: Modified Independence with increased time  IADLs: Shopping: Son assists with grocery shopping Light housekeeping: Son's assist. Pt. Does light rising of dishes off. Meal Prep: Increased time. Independent with microwave, and air fryer. Difficulty opening bottles, containers Community mobility:  Transportation, light driving to grocery store Medication management:  Son's set-up weekly pillbox, Pt. Is responsible for taking the medications Financial management: Son's assist wit monthly finances Handwriting: 50% legible Hobbies: Dancing, watching sports Career/work: Restaurant manager, fast food  MOBILITY STATUS: Uses a cane; shuffling gait  FUNCTIONAL OUTCOME MEASURES:  TBD  UPPER EXTREMITY ROM:    Active ROM Right eval Left Eval WFL overall  Shoulder flexion [facility]   Shoulder abduction 52(100)   Shoulder adduction    Shoulder extension    Shoulder internal rotation    Shoulder external rotation  Elbow flexion WFL   Elbow extension WFL   Wrist flexion    Wrist extension WFL   Wrist ulnar deviation    Wrist radial deviation    Wrist pronation    Wrist supination    (Blank rows = not tested)  UPPER EXTREMITY MMT:     MMT  Right eval Left Eval 4-/5 overall  Shoulder flexion 3/5   Shoulder abduction 2/5   Shoulder adduction    Shoulder extension    Shoulder internal rotation    Shoulder external rotation    Middle trapezius    Lower trapezius    Elbow flexion 3/5   Elbow extension 3/5   Wrist flexion    Wrist extension 3/5   Wrist ulnar deviation    Wrist radial deviation    Wrist pronation    Wrist supination    (Blank rows = not tested)  HAND FUNCTION:  Grip strength: Right: 50 lbs; Left: 34 lbs, Lateral pinch: Right: 17 lbs, Left: 9 lbs, 3 point pinch: Right: 12 lbs, Left: 11 lbs  COORDINATION:  9 Hole Peg test: Right: 1 min. & 46 sec; Left: 53 sec  SENSATION:  Light touch: WFL Proprioception: WFL  EDEMA: N/A  COGNITION: Overall cognitive status: Within functional limits for tasks assessed  VISION:  Does not wear glasses.  VISION ASSESSMENT: To be further assessed in functional context  PERCEPTION: WFL  PRAXIS: Impaired: Motor planning                                                                                                                         TREATMENT DATE: 10/04/23  Therapeutic Ex:  -RUE PROM was performed with slow prolonged gentle stretching in all joint planes of the UE, and hand.  -Grip  and Pinch strength measurements were obtained.  Manual Therapy:  -Soft tissue massage was performed to the scapular, and UE musculature 2/2 tightness -Pt. Tolerated scapular mobilization for elevation, depression, and abduction/rotation in sitting to normalize tone, decrease tightness, and prepare for ROM.   -Manual therapy was performed independent of, and in preparation for therapeutic Ex.      PATIENT EDUCATION: Education details: OT services, POC, goals, and ADL functional status Person educated: Patient Education method: Explanation, Demonstration, Tactile cues, and Verbal cues Education comprehension: needs further education  HOME EXERCISE PROGRAM:  Continue  to assess, and provide as indicated.   GOALS: Goals reviewed with patient? Yes  SHORT TERM GOALS: Target date: 11/09/2023    Pt. Will be independent with HEPs for RUE. Baseline: Eval: No current HEP Goal status: INITIAL  LONG TERM GOALS: Target date: 12/21/2023  1.  Pt. Will increase right shoulder abduction by 10 degrees to assist with UE dressing Baseline: Eval: Right 52(100) Goal status: INITIAL  2.  Pt. Will increase BUE strength by 2 mm grades to assist with ADLs. Baseline: Eval: Right: shoulder flexion: 3/5, abduction: 2/5, elbow flexion/extension: 3/5, wrist extension: 3/5. Left: 4-/5 overall Goal status: INITIAL  3.  Pt. Will perform self-feeding with modified independence Baseline: Eval: Difficulty using dominant right hand. Drops food when scooping, Both hands for stabilizing drinks, and uses a straw. Unable to cut food. Goal status: INITIAL  4.  Pt. Will demonstrate independence with proper A/E techniques/compensatory strategies for ADL/IADLs.  Baseline: Eval: Education to be provided Goal status: INITIAL  5.  Pt. Will write a sentence with 75% legibility with modified independence Baseline: Eval: 50% legibility for name only. Goal status: INITIAL   ASSESSMENT:  CLINICAL IMPRESSION:  Pt. presents with increased tightness and tone proximally through the scapular, and UE musculature. Pt. responded well to the gentle stretching this morning, however continues  to present with 3/10 pain in the neck, and right shoulder with light activity. No tremors noted during the session. Pt. continues to benefit from OT services to work on improving bilateral UE functioning in order to maximize engagement in, and efficiency in ADLs, and IADL tasks, and provide education about compensatory strategies.   PERFORMANCE DEFICITS: in functional skills including ADLs, IADLs, coordination, dexterity, proprioception, sensation, ROM, strength, pain, Fine motor control, Gross motor control, and  UE functional use, cognitive skills including , and psychosocial skills including coping strategies, environmental adaptation, and routines and behaviors.   IMPAIRMENTS: are limiting patient from ADLs, IADLs, and leisure.   CO-MORBIDITIES: may have co-morbidities  that affects occupational performance. Patient will benefit from skilled OT to address above impairments and improve overall function.  MODIFICATION OR ASSISTANCE TO COMPLETE EVALUATION: Min-Moderate modification of tasks or assist with assess necessary to complete an evaluation.  OT OCCUPATIONAL PROFILE AND HISTORY: Detailed assessment: Review of records and additional review of physical, cognitive, psychosocial history related to current functional performance.  CLINICAL DECISION MAKING: Moderate - several treatment options, min-mod task modification necessary  REHAB POTENTIAL: Good  EVALUATION COMPLEXITY: Moderate    PLAN:  OT FREQUENCY: 2x/week  OT DURATION: 12 weeks  PLANNED INTERVENTIONS: 97168 OT Re-evaluation, 97535 self care/ADL training, 08657 therapeutic exercise, 97530 therapeutic activity, 97112 neuromuscular re-education, 97140 manual therapy, 97018 paraffin, 84696 moist heat, 97034 contrast bath, 97760 Orthotics management and training, 29528 Splinting (initial encounter), energy conservation, patient/family education, and DME and/or AE instructions  RECOMMENDED OTHER SERVICES:   PT  CONSULTED AND AGREED WITH PLAN OF CARE: Patient  PLAN FOR NEXT SESSION:   Treatment  Olegario Messier, MS, OTR/L 10/04/2023, 9:35 AM

## 2023-10-04 NOTE — Therapy (Signed)
 OUTPATIENT PHYSICAL THERAPY NEURO EVALUATION   Patient Name: Daniel Daugherty MRN: [id number] DOB:October 25, 1965, 58 y.o., male Today's Date: 10/04/2023   PCP: Leanord Asal, Nelva Bush, MD  REFERRING PROVIDER: Leanord Asal, Nelva Bush, MD   END OF SESSION:   PT End of Session - 10/04/23 1001     Visit Number 2    Number of Visits 24    Date for PT Re-Evaluation 12/21/23    Authorization Type Medicare 2025    PT Start Time 1015    PT Stop Time 1100    PT Time Calculation (min) 45 min    Equipment Utilized During Treatment Gait belt    Activity Tolerance Patient tolerated treatment well    Behavior During Therapy WFL for tasks assessed/performed              Past Medical History:  Diagnosis Date   Hypertension    Past Surgical History:  Procedure Laterality Date   DG HAND LEFT COMPLETE ([facility] HX) Left 04/13/1999   There are no active problems to display for this patient.   ONSET DATE: 2022 is when he noticed the change and that was around the same time he was diagnosed with Parkinson's  REFERRING DIAG:  G20.A1 (ICD-10-CM) - Parkinson disease (HCC)  Z74.1 (ICD-10-CM) - Requires daily assistance for activities of daily living (ADL) and comfort needs   THERAPY DIAG:  Muscle weakness (generalized)  Other lack of coordination  Abnormality of gait  Unsteadiness on feet  Generalized weakness  Rationale for Evaluation and Treatment: Rehabilitation  SUBJECTIVE:                                                                                                                                                                                             SUBJECTIVE STATEMENT:  Pt reports he is doing good, but he is "sore" from the stretching in his neck, chest, and shoulders during OT immediately prior to this session. Pt denies any stumbles/falls since last week, but reports he has just been "moving slow." Pt denies any medical changes since last visit. Pt reports he has a cruise  planned this summer he needs to be ready for.   Pt accompanied by: self  PERTINENT HISTORY: Parkinsonism, Vitamin D deficiency, Hyperlipidemia, HTN, poor medication compliance (per chart review)  PAIN:  Are you having pain? Yes: NPRS scale: 5/10, but states "it ain't that bad" Pain location: R shoulder and low back Pain description: throbbing in shoulder Aggravating factors: overhead movements or reaching back Relieving factors: rest and at home uses a "massager"  PRECAUTIONS: Fall  RED FLAGS: None   WEIGHT BEARING  RESTRICTIONS: No  FALLS: Has patient fallen in last 6 months? Yes. 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   PATIENT GOALS: Get my balance right with my walking, help get my R arm stronger and be able to move it more, improve strength in my legs (specifically the Right)  OBJECTIVE:  Note: Objective measures were completed at Evaluation unless otherwise noted.  DIAGNOSTIC FINDINGS: N/A  COGNITION: Overall cognitive status: Within functional limits for tasks assessed; however, did not assess higher level cognitive tasks   SENSATION: WFL Light touch on screen Pt reports R foot falls asleep sitting on toilet and he has difficult time getting feeling back in it before feeling safe standing up.  COORDINATION: Overall bradykinesia with decreased speed and amplitude of movements  EDEMA:   None present  MUSCLE TONE: Not formally assessed  MUSCLE LENGTH: Not formally assessed, but may have some hamstring tightness on R associated with quad weakness and inability to achieve full knee extension in sitting  DTRs:  Not assessed  POSTURE: rounded shoulders, forward head, increased thoracic kyphosis, posterior pelvic tilt, and flexed trunk    LOWER EXTREMITY ROM:     Active ROM Right Eval Left Eval  Hip flexion Decreased in sitting compared to L   Hip extension    Hip abduction    Hip adduction    Hip internal rotation    Hip external rotation    Knee flexion    Knee extension Lacks full knee extension in sitting   Ankle dorsiflexion Limited in sitting Limited in sitting  Ankle plantarflexion    Ankle inversion    Ankle eversion     (Blank rows = not tested)  LOWER EXTREMITY MMT:    MMT Right Eval Left Eval  Hip flexion 3- 4+  Hip extension    Hip abduction    Hip adduction    Hip internal rotation    Hip external rotation    Knee flexion 3 4+  Knee extension 3- 4+  Ankle dorsiflexion 3- 4+  Ankle plantarflexion 3- 4+  Ankle inversion    Ankle eversion    (Blank rows = not tested)  Manual Muscle Test Scale 0/5 = No muscle contraction can be seen or felt 1/5 = Contraction can be felt, but there is no motion 2-/5 = Part moves through incomplete ROM w/ gravity decreased 2/5 = Part moves through complete ROM w/ gravity decreased 2+/5 = Part moves through incomplete ROM (<50%) against gravity or through complete ROM w/ gravity 3-/5 = Part moves through incomplete ROM (>50%) against gravity 3/5 = Part moves through complete ROM against gravity 3+/5 = Part moves through complete ROM against gravity/slight resistance 4-/5= Holds test position against slight to moderate pressure 4/5 = Part moves through complete ROM against gravity/moderate resistance 4+/5= Holds test position against moderate to strong pressure 5/5 = Part moves through complete ROM  against gravity/full resistance  BED MOBILITY:  Sit to supine   Supine  to sit   Need to assess  ADL: Reports dificulty getitng on R shoe, specifically having trouble pushing his foot down in his shoe  TRANSFERS: Assistive device utilized:  Hurry cane   Sit to stand: Modified independence and SBA Stand to sit: Modified independence and SBA Chair to chair: Modified independence and SBA Floor:  would benefit from testing in future  RAMP:  Level of Assistance:    Assistive device utilized:  Hurry cane Ramp Comments: would benefit from assessing  CURB:  Level of Assistance:    Assistive device utilized:  [employer] Comments: would benefit from assessing  STAIRS: Level of Assistance: CGA and Min A Stair Negotiation Technique: Step to Pattern Forwards with Bilateral Rails Number of Stairs: 4  Height of Stairs: 6in  Comments: leading with R LE in both directions, attempts reciprocal pattern on ascent with sufficient strength, but having uncontrolled anterior lean during descent  GAIT: Gait pattern: step through pattern, decreased arm swing- Right, decreased arm swing- Left, decreased step length- Right, decreased step length- Left, decreased stride length, decreased hip/knee flexion- Right, decreased hip/knee flexion- Left, decreased ankle dorsiflexion- Right, decreased ankle dorsiflexion- Left, decreased trunk rotation, trunk flexed, poor foot clearance- Right, and poor foot clearance- Left Distance walked: 151ft Assistive device utilized: None Level of assistance: CGA Comments: significantly decreased  FUNCTIONAL TESTS:  5 times sit to stand: 38.53 seconds arms across chest Timed up and go (TUG): 19.46 seconds without AD 6 minute walk test: 10/04/2023: 553ft (114m at 0.77m/s avg) 10 meter walk test: 0.56 m/s without AD Mini-Best: 10/04/2023: 8/28  PATIENT SURVEYS:  ABC scale 17.5%  with pt feeling <40% confident on all of the items, pt feels only 30% confident walking  around the house, 10% confident getting in/out of car, and 20% reaching for items                                                                                                                               TREATMENT DATE: 10/04/23  Unless otherwise stated, CGA was provided and gait belt donned in order to ensure pt safety throughout.  6 Min Walk Test:  Instructed patient to ambulate as quickly and as safely as possible for 6 minutes using LRAD. Patient was allowed to take standing rest breaks without stopping the test, but if the patient required a sitting rest break the clock would be stopped and the test would be over.  Results: 560 feet requiring 2x standing rest breaks (at 75min30sec and at ) (170 meters, Avg speed 0.47 m/s) no AD with CGA for safety/steadying. Results indicate that the patient has reduced endurance with ambulation compared to age matched norms.  Age Matched Norms: 43-69 yo M: 70 F: 61, 36-79 yo M: 60 F: 471, 14-89 yo M: 417 F: 392 MDC: 58.21 meters (190.98 feet) or 50 meters (ANPTA Core Set of Outcome Measures for Adults with Neurologic Conditions, 2018)  *During pt demos excessive  thoracic kyphosis, forward head, lack of arm swing and trunk rotation, with short shuffled step lengths bilaterally.   Participated in Timed Up and Go (TUG), without AD: 1st trial: 29.22 seconds 2nd trial (with dual-task: counting backwards by 3 starting at 30): 39.03 seconds  Patient demonstrates high fall risk as indicated by requiring >13.5seconds to complete the TUG.     [facility] PT Assessment - 10/04/23 0001       Mini-BESTest   Sit To Stand Normal: Comes to stand without use of hands and stabilizes independently.    Rise to Toes Moderate: Heels up, but not full range (smaller than when holding hands), OR noticeable instability for 3 s.    Stand on one leg (left) Moderate: < 20 s   11 seconds   Stand on one leg (right) Moderate: < 20 s   6 seconds   Stand on one leg -  lowest score 1    Compensatory Stepping Correction - Forward Moderate: More than one step is required to recover equilibrium    Compensatory Stepping Correction - Backward No step, OR would fall if not caught, OR falls spontaneously.    Compensatory Stepping Correction - Left Lateral Moderate: Several steps to recover equilibrium    Compensatory Stepping Correction - Right Lateral Moderate: Several steps to recover equilibrium    Stepping Corredtion Lateral - lowest score 1    Stance - Feet together, eyes open, firm surface  Moderate: < 30s    Stance - Feet together, eyes closed, foam surface  Moderate: < 30s    Incline - Eyes Closed Severe: Unable   has posterior LOB requiring assist to maintain upright   Change in Gait Speed Severe: Unable to achieve significant change in walking speed AND signs of imbalance.    Walk with head turns - Horizontal Severe: performs head turns with imbalance    Walk with pivot turns Severe: Cannot turn with feet close at any speed without imbalance.    Step over obstacles Severe: Unable to step over box OR steps around box    Timed UP & GO with Dual Task Severe: Stops counting while walking OR stops walking while counting.    Mini-BEST total score 8            Pt educated on results of balance assessment indicating significant fall risk. Assessment indicates pt will benefit from interventions addressing: SLS, NBOS with eyes closed and on compliant surfaces, stepping strategy retraining, upright postural awareness interventions, dynamic gait challenges, and stepping over obstacles.    PATIENT EDUCATION: Education details: PT POC, Goals, results of outcome measures indicating increased fall risk Person educated: Patient Education method: Explanation Education comprehension: verbalized understanding and needs further education  HOME EXERCISE PROGRAM: Need to initiate   GOALS: Goals reviewed with patient? Yes  SHORT TERM GOALS: Target date:  11/09/2023    Patient will be independent in home exercise program to improve strength/mobility for better functional independence with ADLs. Baseline: need to initiate Goal status: INITIAL   LONG TERM GOALS: Target date: 12/21/2023  1.  Patient (< 41 years old) will complete five times sit to stand test in < 12 seconds indicating an increased LE strength and improved balance. Baseline: 09/28/23: 38.53 seconds Goal status: INITIAL   2. Patient will reduce timed up and go to <11 seconds to reduce fall risk and demonstrate improved transfer/gait ability. Baseline: 09/28/23: 19.46 seconds without AD Goal status: INITIAL  3.  Patient will increase 10 meter walk test to >  1.56m/s as to improve gait speed for better community ambulation and to reduce fall risk. Baseline: 09/28/23: 0.61m/s without AD Goal status: INITIAL  4.  Patient will increase six minute walk test distance to >1000 for progression to community ambulator and improve gait ability Baseline: 10/04/2023: 569ft (135m at 0.32m/s avg) Goal status: INITIAL  5. Patient will increase MiniBest Test score to >18/28 to indicate a reduced risk for falling and demonstrate increased independence with functional mobility and ADLs.  Baseline: 10/04/2023: 8/28  Goal status: INITIAL  6. Patient will increase ABC scale score >80% to demonstrate better functional mobility and better confidence with ADLs.   Baseline: 09/28/23: 17.5%  Goal status: INITIAL   ASSESSMENT:  CLINICAL IMPRESSION:  Patient is a 58 y.o. male who was seen today for physical therapy treatment for R>L B LE weakness, impaired balance, and gait deviations due to Parkinson's disease. Mr. Casebolt presents with high risk of falling as noted by scoring 8/28 on MiniBest Test with noticeable impact of cognitive challenges on his mobility and would benefit from dual-task training during future interventions. Pt presents with significant balance deficits in all areas (static and dynamic  standing and gait challenges) with pt having baseline impaired midline orientation with excessive thoracic kyphosis and forward trunk flexed posture. Patient also demonstrates bradykinesia with decreased speed and amplitude of movements. Mr. Holst will benefit from further skilled PT to improve these deficits in order to increase QOL and ease/safety with ADLs as well as decrease risk for falls.   OBJECTIVE IMPAIRMENTS: Abnormal gait, decreased activity tolerance, decreased balance, decreased coordination, decreased endurance, decreased knowledge of condition, decreased knowledge of use of DME, decreased mobility, difficulty walking, decreased ROM, decreased strength, decreased safety awareness, improper body mechanics, postural dysfunction, and pain.   ACTIVITY LIMITATIONS: carrying, lifting, bending, standing, squatting, sleeping, stairs, transfers, bed mobility, continence, bathing, toileting, dressing, reach over head, hygiene/grooming, and locomotion level  PARTICIPATION LIMITATIONS: meal prep, cleaning, laundry, and community activity  PERSONAL FACTORS: Fitness and 3+ comorbidities: Vitamin D deficiency, Hyperlipidemia, and HTN  are also affecting patient's functional outcome.   REHAB POTENTIAL: Good  CLINICAL DECISION MAKING: Evolving/moderate complexity  EVALUATION COMPLEXITY: Moderate  PLAN:  PT FREQUENCY: 1-2x/week  PT DURATION: 12 weeks  PLANNED INTERVENTIONS: 97164- PT Re-evaluation, 97110-Therapeutic exercises, 97530- Therapeutic activity, [id number]- Neuromuscular re-education, 97535- Self Care, 29562- Manual therapy, [id number]- Gait training, [phone]- Orthotic Fit/training, [phone]- Canalith repositioning, [phone]- Electrical stimulation (manual), Patient/Family education, Balance training, Stair training, Joint mobilization, Vestibular training, Visual/preceptual remediation/compensation, DME instructions, Cryotherapy, and Moist heat  PLAN FOR NEXT SESSION:  - initiate HEP  - posture -  assess bed mobility - stepping in different directions  - B LE strengthening (R>L)  - standing balance: narrow BOS/SLS, on compliant surfaces, eyes open&eyes closed - dynamic gait with focus on increased upright posture and speed of ambulation     Lamar Naef, PT, DPT, NCS, CSRS Physical Therapist - [place]  [facility]  1:29 PM 10/04/23

## 2023-10-07 ENCOUNTER — Ambulatory Visit: Payer: 59 | Admitting: Occupational Therapy

## 2023-10-07 ENCOUNTER — Ambulatory Visit: Payer: 59 | Admitting: Physical Therapy

## 2023-10-07 DIAGNOSIS — R269 Unspecified abnormalities of gait and mobility: Secondary | ICD-10-CM

## 2023-10-07 DIAGNOSIS — M6281 Muscle weakness (generalized): Secondary | ICD-10-CM

## 2023-10-07 DIAGNOSIS — R531 Weakness: Secondary | ICD-10-CM

## 2023-10-07 DIAGNOSIS — R2681 Unsteadiness on feet: Secondary | ICD-10-CM

## 2023-10-07 DIAGNOSIS — R278 Other lack of coordination: Secondary | ICD-10-CM

## 2023-10-07 NOTE — Therapy (Signed)
 OUTPATIENT PHYSICAL THERAPY NEURO TREATMENT   Patient Name: Daniel Daugherty MRN: [id number] DOB:1966/03/28, 58 y.o., male Today's Date: 10/07/2023   PCP: Leanord Asal, Nelva Bush, MD  REFERRING PROVIDER: Leanord Asal, Nelva Bush, MD   END OF SESSION:   PT End of Session - 10/07/23 0936     Visit Number 3    Number of Visits 24    Date for PT Re-Evaluation 12/21/23    Authorization Type Medicare 2025    PT Start Time 0934    PT Stop Time 1016    PT Time Calculation (min) 42 min    Equipment Utilized During Treatment Gait belt    Activity Tolerance Patient tolerated treatment well    Behavior During Therapy WFL for tasks assessed/performed               Past Medical History:  Diagnosis Date   Hypertension    Past Surgical History:  Procedure Laterality Date   DG HAND LEFT COMPLETE ([facility] HX) Left 04/13/1999   There are no active problems to display for this patient.   ONSET DATE: 2022 is when he noticed the change and that was around the same time he was diagnosed with Parkinson's  REFERRING DIAG:  G20.A1 (ICD-10-CM) - Parkinson disease (HCC)  Z74.1 (ICD-10-CM) - Requires daily assistance for activities of daily living (ADL) and comfort needs   THERAPY DIAG:  Muscle weakness (generalized)  Other lack of coordination  Abnormality of gait  Unsteadiness on feet  Generalized weakness  Rationale for Evaluation and Treatment: Rehabilitation  SUBJECTIVE:                                                                                                                                                                                             SUBJECTIVE STATEMENT:  Pt reports doing good. Pt denies any stumbles/falls since last session. Pt reports he felt good after last PT session. Pt reports he slept good that night after therapy.   Pt accompanied by: self  PERTINENT HISTORY: Parkinsonism, Vitamin D deficiency, Hyperlipidemia, HTN, poor medication compliance (per  chart review)  PAIN:  Are you having pain? Yes: NPRS scale: 5/10, but states "it ain't that bad" Pain location: R shoulder and low back Pain description: throbbing in shoulder Aggravating factors: overhead movements or reaching back Relieving factors: rest and at home uses a "massager"  PRECAUTIONS: Fall  RED FLAGS: None   WEIGHT BEARING RESTRICTIONS: No  FALLS: Has patient fallen in last 6 months? Yes. 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   PATIENT GOALS: Get my balance right with my walking, help get my R arm stronger and be able to move it more, improve strength in my legs (specifically the Right)  OBJECTIVE:  Note: Objective measures were completed at Evaluation unless otherwise noted.  DIAGNOSTIC FINDINGS: N/A  COGNITION: Overall cognitive status: Within functional limits for tasks assessed; however, did not assess higher level cognitive tasks   SENSATION: WFL Light touch on screen Pt reports R foot falls asleep sitting on toilet and he has difficult time getting feeling back in it before feeling safe standing up.  COORDINATION: Overall bradykinesia with decreased speed and amplitude of movements  EDEMA:  None present  MUSCLE TONE: Not formally assessed  MUSCLE LENGTH: Not formally assessed, but may have some hamstring tightness on R associated with quad weakness and inability to  achieve full knee extension in sitting  DTRs:  Not assessed  POSTURE: rounded shoulders, forward head, increased thoracic kyphosis, posterior pelvic tilt, and flexed trunk    LOWER EXTREMITY ROM:     Active ROM Right Eval Left Eval  Hip flexion Decreased in sitting compared to L   Hip extension    Hip abduction    Hip adduction    Hip internal rotation    Hip external rotation    Knee flexion    Knee extension Lacks full knee extension in sitting   Ankle dorsiflexion Limited in sitting Limited in sitting  Ankle plantarflexion    Ankle inversion    Ankle eversion     (Blank rows = not tested)  LOWER EXTREMITY MMT:    MMT Right Eval Left Eval  Hip flexion 3- 4+  Hip extension    Hip abduction    Hip adduction    Hip internal rotation    Hip external rotation    Knee flexion 3 4+  Knee extension 3- 4+  Ankle dorsiflexion 3- 4+  Ankle plantarflexion 3- 4+  Ankle inversion    Ankle eversion    (Blank rows = not tested)  Manual Muscle Test Scale 0/5 = No muscle contraction can be seen or felt 1/5 = Contraction can be felt, but there is no motion 2-/5 = Part moves through incomplete ROM w/ gravity decreased 2/5 = Part moves through complete ROM w/ gravity decreased 2+/5 = Part moves through incomplete ROM (<50%) against gravity or through complete ROM w/ gravity 3-/5 = Part moves through incomplete ROM (>50%) against gravity 3/5 = Part moves through complete ROM against gravity 3+/5 = Part moves through complete ROM against gravity/slight resistance 4-/5= Holds test position against slight to moderate pressure 4/5 = Part moves through complete ROM against gravity/moderate resistance 4+/5= Holds test position against moderate to strong pressure 5/5 = Part moves through complete ROM against gravity/full resistance  BED MOBILITY:  Sit to supine   Supine to sit   Need to assess  ADL: Reports dificulty getitng on R shoe, specifically having trouble pushing his foot  down in his shoe  TRANSFERS: Assistive device utilized:  Hurry cane  Sit to stand: Modified independence and SBA Stand to sit: Modified independence and SBA Chair to chair: Modified independence and SBA Floor:  would benefit from testing in future  RAMP:  Level of Assistance:    Assistive device utilized:  Hurry cane Ramp Comments: would benefit from assessing  CURB:  Level of Assistance:    Assistive device utilized:  [employer] Comments: would benefit from assessing  STAIRS: Level of Assistance: CGA and Min A Stair Negotiation Technique: Step to Pattern Forwards with Bilateral Rails Number of Stairs: 4  Height of Stairs: 6in  Comments: leading with R LE in both directions, attempts reciprocal pattern on ascent with sufficient strength, but having uncontrolled anterior lean during descent  GAIT: Gait pattern: step through pattern, decreased arm swing- Right, decreased arm swing- Left, decreased step length- Right, decreased step length- Left, decreased stride length, decreased hip/knee flexion- Right, decreased hip/knee flexion- Left, decreased ankle dorsiflexion- Right, decreased ankle dorsiflexion- Left, decreased trunk rotation, trunk flexed, poor foot clearance- Right, and poor foot clearance- Left Distance walked: 160ft Assistive device utilized: None Level of assistance: CGA Comments: significantly decreased  FUNCTIONAL TESTS:  5 times sit to stand: 38.53 seconds arms across chest Timed up and go (TUG): 19.46 seconds without AD 6 minute walk test: 10/04/2023: 577ft (132m at 0.26m/s avg) 10 meter walk test: 0.56 m/s without AD Mini-Best: 10/04/2023: 8/28  PATIENT SURVEYS:  ABC scale 17.5%  with pt feeling <40% confident on all of the items, pt feels only 30% confident walking around the house, 10% confident getting in/out of car, and 20% reaching for items                                                                                                                                TREATMENT DATE: 10/07/23  Unless otherwise stated, CGA/min A was provided and gait belt donned in order to ensure pt safety throughout.  Performed the following seated exercises to promote improved posture and large amplitude UE movements: - Seated Forward Bending to Upright arms extended x10reps - Seated Reaching to Side and Across Body x10reps - Seated Side Bend Elbow on Knee with Opposite Arm Reach Overhead x10 reps Therapist providing mirror imaging of the movements to promote larger amplitude and faster movements. Pt continues to have greater impairments lifting/rising R UE compared to L UE. Pt reports some popping in his shoulders with this, but no increased pain.  Gait training ~349ft focusing on fast speed walking with heel strike bilaterally and increasing arm swing - light min A for balance - cuing for increased step length and upright posture while maintaining faster walking speed  Sit>stands with arms on knees progress to stranding with arms extended behind him x10reps progressed to rising up and standing on toes x5 reps requiring x3 min A for balance due to anterior lean - pt frequently having posterior lean/LOB sitting back on mat.  Standing lateral side steps each direction with  ipsilateral shoulder abduction x10 reps - therapist providing mirroring to promote larger amplitude movements - pt continues to have slowed bradykinetic movements with decreased R shoulder AROM compared to L.  Gait training ~166ft with same focus as earlier - cuing for heel strike on initial contact bilaterally as pt lands on toes otherwise - therapist facilitating arm swing bilaterally with pt able to carryover briefly.    PATIENT EDUCATION: Education details: PT POC, Goals, results of outcome measures indicating increased fall risk Person educated: Patient Education method: Explanation Education comprehension: verbalized understanding and needs further education  HOME EXERCISE  PROGRAM: Access Code: [email or web address] URL: [email or web address] Date: 10/07/2023 Prepared by: Casimiro Needle  Exercises - Seated Forward Bending  - 1 x daily - 7 x weekly - 2 sets - 10 reps - Seated Reaching to Side and Across Body  - 1 x daily - 7 x weekly - 2 sets - 10 reps - Seated Side Bend Elbow on Knee with Opposite Arm Reach Overhead -  1 x daily - 7 x weekly - 2 sets - 10 reps  GOALS: Goals reviewed with patient? Yes  SHORT TERM GOALS: Target date: 11/09/2023    Patient will be independent in home exercise program to improve strength/mobility for better functional independence with ADLs. Baseline: need to initiate Goal status: INITIAL   LONG TERM GOALS: Target date: 12/21/2023  1.  Patient (< 44 years old) will complete five times sit to stand test in < 12 seconds indicating an increased LE strength and improved balance. Baseline: 09/28/23: 38.53 seconds Goal status: INITIAL   2. Patient will reduce timed up and go to <11 seconds to reduce fall risk and demonstrate improved transfer/gait ability. Baseline: 09/28/23: 19.46 seconds without AD Goal status: INITIAL  3.  Patient will increase 10 meter walk test to >1.41m/s as to improve gait speed for better community ambulation and to reduce fall risk. Baseline: 09/28/23: 0.62m/s without AD Goal status: INITIAL  4.  Patient will increase six minute walk test distance to >1000 for progression to community ambulator and improve gait ability Baseline: 10/04/2023: 540ft (135m at 0.15m/s avg) Goal status: INITIAL  5. Patient will increase MiniBest Test score to >18/28 to indicate a reduced risk for falling and demonstrate increased independence with functional mobility and ADLs.  Baseline: 10/04/2023: 8/28  Goal status: INITIAL  6. Patient will increase ABC scale score >80% to demonstrate better functional mobility and better confidence with ADLs.   Baseline: 09/28/23: 17.5%  Goal status: INITIAL   ASSESSMENT:  CLINICAL  IMPRESSION:  Patient is a 58 y.o. male who was seen today for physical therapy treatment for R>L B LE weakness, impaired balance, impaired posture, and gait deviations due to Parkinson's disease. Therapy session focused on initiation of seated HEP targeting improved upright trunk posture and large amplitude movements in UEs. As well as progression of those movements into a standing position for balance challenge. Patient noted to continue having posterior LOB bias requiring CGA/min A to maintain upright during standing balance interventions. Patient participated in gait training focusing on increased gait speed, increased step length, and increased arm swing requiring light min A for balance and repeated verbal cuing to improve above deviations with greatest difficulty increasing arm swing. Patient continues to have bradykinesia with decreased speed and amplitude of movements (R>L). Mr. Duque will benefit from further skilled PT to improve these deficits in order to increase QOL and ease/safety with ADLs and functional mobility as well as decrease risk for falls.  OBJECTIVE IMPAIRMENTS: Abnormal gait, decreased activity tolerance, decreased balance, decreased coordination, decreased endurance, decreased knowledge of condition, decreased knowledge of use of DME, decreased mobility, difficulty walking, decreased ROM, decreased strength, decreased safety awareness, improper body mechanics, postural dysfunction, and pain.   ACTIVITY LIMITATIONS: carrying, lifting, bending, standing, squatting, sleeping, stairs, transfers, bed mobility, continence, bathing, toileting, dressing, reach over head, hygiene/grooming, and locomotion level  PARTICIPATION LIMITATIONS: meal prep, cleaning, laundry, and community activity  PERSONAL FACTORS: Fitness and 3+ comorbidities: Vitamin D deficiency, Hyperlipidemia, and HTN  are also affecting patient's functional outcome.   REHAB POTENTIAL: Good  CLINICAL DECISION MAKING:  Evolving/moderate complexity  EVALUATION COMPLEXITY: Moderate  PLAN:  PT FREQUENCY: 1-2x/week  PT DURATION: 12 weeks  PLANNED INTERVENTIONS: 97164- PT Re-evaluation, 97110-Therapeutic exercises, 97530- Therapeutic activity, [id number]- Neuromuscular re-education, 97535- Self Care, 57846- Manual therapy, [phone]- Gait training, [phone]- Orthotic Fit/training, [phone]- Canalith repositioning, [phone]- Electrical stimulation (manual), Patient/Family education, Balance training, Stair training, Joint mobilization, Vestibular training, Visual/preceptual remediation/compensation, DME instructions, Cryotherapy, and Moist heat  PLAN FOR NEXT SESSION:  - review and progress HEP  - assess bed mobility - stepping in different directions  - large amplitude UE movements with promotion of improved upright posture - B LE strengthening (R>L)  - standing balance: narrow BOS/SLS, on compliant surfaces, eyes open&eyes closed - dynamic gait with focus on increased upright posture and speed of ambulation with arm swing (add AWs to UEs?)      Cythnia Osmun, PT, DPT, NCS, CSRS Physical Therapist - [place]  [facility]  10:05 PM 10/07/23

## 2023-10-07 NOTE — Therapy (Addendum)
 OUTPATIENT OCCUPATIONAL THERAPY NEURO TREATMENT NOTE  Patient Name: Daniel Daugherty MRN: [id number] DOB:10-21-1965, 58 y.o., male Today's Date: 10/07/2023  PCP: N/A REFERRING PROVIDER: Norval Morton, MD  END OF SESSION:  OT End of Session - 10/07/23 1203     Visit Number 3    Number of Visits 24    Date for OT Re-Evaluation 12/21/23    OT Start Time 0930    OT Stop Time 1015    OT Time Calculation (min) 45 min    Activity Tolerance Patient tolerated treatment well    Behavior During Therapy WFL for tasks assessed/performed             Past Medical History:  Diagnosis Date   Hypertension    Past Surgical History:  Procedure Laterality Date   DG HAND LEFT COMPLETE ([facility] HX) Left 04/13/1999   There are no active problems to display for this patient.   ONSET DATE: 05/2021  REFERRING DIAG: Parkinson's Disease  THERAPY DIAG:  Muscle weakness (generalized)  Other lack of coordination  Rationale for Evaluation and Treatment: Rehabilitation  SUBJECTIVE:   SUBJECTIVE STATEMENT: Pt. reports that his arm felt better after the last session, looser as the day went on. Pt accompanied by: self  PERTINENT HISTORY:   Pt. is a 58 y.o. male who was diagnosed with Parkinson's Disease in 2022. Pt. reports having had a progressive decline in function since having hernia surgery, and right shoulder rotator cuff surgery. PMHx included: HTN. Of note; Pt. was being followed by [facility], and receiving Home health therapy services. Pt. changed physicians, and was referred for outpatient rehab services here.  PRECAUTIONS: None  WEIGHT BEARING RESTRICTIONS: No  PAIN:  Are you having pain? 2/10 Neck, right shoulder. Pt. Has a history of a right shoulder rotator cuff injury/repair  FALLS: Has patient fallen in last 6 months? No, 1 small slip near the bed tripped over shoes  LIVING ENVIRONMENT: Lives with: lives alone Lives in: [place],  Stairs: Entry level Has following  equipment at home: Quad cane small base, rollator, shower chair, grab bars, hand rail at [employer]  PLOF: Independent  PATIENT GOALS: Improve self-care  OBJECTIVE:  Note: Objective measures were completed at Evaluation unless otherwise noted.  HAND DOMINANCE: Right  ADLs:  Assist at home:  Ex wife, and children assist several times a week  Transfers/ambulation related to ADLs: Modified Independent Eating: Difficulty using dominant right hand. Drops food when scooping, Both hands for stabilizing drinks, and uses a straw. Unable to cut food.  Grooming: Increased time required for all grooming tasks. UB Dressing: Difficulty managing buttons LB Dressing: Less assist with pants with elastic, more assist with dress pants Toileting: Increased time for hygiene. Bathing: Requires increased time to complete Tub Shower transfers: Modified Independence with increased time  IADLs: Shopping: Son assists with grocery shopping Light housekeeping: Son's assist. Pt. Does light rising of dishes off. Meal Prep: Increased time. Independent with microwave, and air fryer. Difficulty opening bottles, containers Community mobility:  Transportation, light driving to grocery store Medication management:  Son's set-up weekly pillbox, Pt. Is responsible for taking the medications Financial management: Son's assist wit monthly finances Handwriting: 50% legible Hobbies: Dancing, watching sports Career/work: Restaurant manager, fast food  MOBILITY STATUS: Uses a cane; shuffling gait  FUNCTIONAL OUTCOME MEASURES:  TBD  UPPER EXTREMITY ROM:    Active ROM Right eval Left Eval WFL overall  Shoulder flexion [facility]   Shoulder abduction 52(100)   Shoulder adduction    Shoulder  extension    Shoulder internal rotation    Shoulder external rotation    Elbow flexion WFL   Elbow extension WFL   Wrist flexion    Wrist extension WFL   Wrist ulnar deviation    Wrist radial deviation    Wrist pronation    Wrist  supination    (Blank rows = not tested)  UPPER EXTREMITY MMT:     MMT Right eval Left Eval 4-/5 overall  Shoulder flexion 3/5   Shoulder abduction 2/5   Shoulder adduction    Shoulder extension    Shoulder internal rotation    Shoulder external rotation    Middle trapezius    Lower trapezius    Elbow flexion 3/5   Elbow extension 3/5   Wrist flexion    Wrist extension 3/5   Wrist ulnar deviation    Wrist radial deviation    Wrist pronation    Wrist supination    (Blank rows = not tested)  HAND FUNCTION:  Grip strength: Right: 50 lbs; Left: 34 lbs, Lateral pinch: Right: 17 lbs, Left: 9 lbs, 3 point pinch: Right: 12 lbs, Left: 11 lbs  COORDINATION:  9 Hole Peg test: Right: 1 min. & 46 sec; Left: 53 sec  SENSATION:  Light touch: WFL Proprioception: WFL  EDEMA: N/A  COGNITION: Overall cognitive status: Within functional limits for tasks assessed  VISION:  Does not wear glasses.  VISION ASSESSMENT: To be further assessed in functional context  PERCEPTION: WFL  PRAXIS: Impaired: Motor planning                                                                                                                         TREATMENT DATE: 10/07/23  Therapeutic Ex:  -RUE PROM was performed with slow prolonged gentle stretching in all joint planes of the UE, and hand.  -Right hand gross gripping with 2 red resistive bands 2 sets 10 reps each. -Lateral, and 3pt. Pinch strengthening using yellow, red, green, and blue level resistive clips   Manual Therapy:  -Soft tissue massage was performed to the scapular, and UE musculature 2/2 tightness -Pt. tolerated scapular mobilization for elevation, depression, and abduction/rotation in sitting to normalize tone, decrease tightness, and prepare for ROM.   -Manual therapy was performed independent of, and in preparation for therapeutic Ex.      PATIENT EDUCATION: Education details: OT services, POC, goals, and ADL functional  status Person educated: Patient Education method: Explanation, Demonstration, Tactile cues, and Verbal cues Education comprehension: needs further education  HOME EXERCISE PROGRAM:  Continue to assess, and provide as indicated.   GOALS: Goals reviewed with patient? Yes  SHORT TERM GOALS: Target date: 11/09/2023    Pt. Will be independent with HEPs for RUE. Baseline: Eval: No current HEP Goal status: INITIAL  LONG TERM GOALS: Target date: 12/21/2023  1.  Pt. Will increase right shoulder abduction by 10 degrees to assist with UE dressing Baseline: Eval: Right 52(100) Goal status: INITIAL  2.  Pt. Will increase BUE strength by 2 mm grades to assist with ADLs. Baseline: Eval: Right: shoulder flexion: 3/5, abduction: 2/5, elbow flexion/extension: 3/5, wrist extension: 3/5. Left: 4-/5 overall Goal status: INITIAL  3.  Pt. Will perform self-feeding with modified independence Baseline: Eval: Difficulty using dominant right hand. Drops food when scooping, Both hands for stabilizing drinks, and uses a straw. Unable to cut food. Goal status: INITIAL  4.  Pt. Will demonstrate independence with proper A/E techniques/compensatory strategies for ADL/IADLs.  Baseline: Eval: Education to be provided Goal status: INITIAL  5.  Pt. Will write a sentence with 75% legibility with modified independence Baseline: Eval: 50% legibility for name only. Goal status: INITIAL   ASSESSMENT:  CLINICAL IMPRESSION:  Pt. presents with less pain today. Pt. reports his RUE feels looser since the last session. Pt. continues to present with tightness in the right scapular, and shoulder musculature. Pt. requires consistent cues for hand position for lateral, and 3pt. Pinch on the resistive clips. Pt. continues to benefit from OT services to work on improving bilateral UE functioning in order to maximize engagement in, and efficiency in ADLs, and IADL tasks, and provide education about compensatory strategies.    PERFORMANCE DEFICITS: in functional skills including ADLs, IADLs, coordination, dexterity, proprioception, sensation, ROM, strength, pain, Fine motor control, Gross motor control, and UE functional use, cognitive skills including , and psychosocial skills including coping strategies, environmental adaptation, and routines and behaviors.   IMPAIRMENTS: are limiting patient from ADLs, IADLs, and leisure.   CO-MORBIDITIES: may have co-morbidities  that affects occupational performance. Patient will benefit from skilled OT to address above impairments and improve overall function.  MODIFICATION OR ASSISTANCE TO COMPLETE EVALUATION: Min-Moderate modification of tasks or assist with assess necessary to complete an evaluation.  OT OCCUPATIONAL PROFILE AND HISTORY: Detailed assessment: Review of records and additional review of physical, cognitive, psychosocial history related to current functional performance.  CLINICAL DECISION MAKING: Moderate - several treatment options, min-mod task modification necessary  REHAB POTENTIAL: Good  EVALUATION COMPLEXITY: Moderate    PLAN:  OT FREQUENCY: 2x/week  OT DURATION: 12 weeks  PLANNED INTERVENTIONS: 97168 OT Re-evaluation, 97535 self care/ADL training, 40981 therapeutic exercise, 97530 therapeutic activity, 97112 neuromuscular re-education, 97140 manual therapy, 97018 paraffin, 19147 moist heat, 97034 contrast bath, 97760 Orthotics management and training, 82956 Splinting (initial encounter), energy conservation, patient/family education, and DME and/or AE instructions  RECOMMENDED OTHER SERVICES:   PT  CONSULTED AND AGREED WITH PLAN OF CARE: Patient  PLAN FOR NEXT SESSION:   Treatment  Olegario Messier, MS, OTR/L 10/07/2023, 12:11 PM

## 2023-10-11 ENCOUNTER — Encounter: Payer: 59 | Admitting: Occupational Therapy

## 2023-10-11 ENCOUNTER — Ambulatory Visit: Payer: 59 | Admitting: Physical Therapy

## 2023-10-14 ENCOUNTER — Ambulatory Visit: Payer: 59 | Admitting: Occupational Therapy

## 2023-10-14 ENCOUNTER — Ambulatory Visit: Payer: 59 | Admitting: Physical Therapy

## 2023-10-14 DIAGNOSIS — R531 Weakness: Secondary | ICD-10-CM

## 2023-10-14 DIAGNOSIS — M6281 Muscle weakness (generalized): Secondary | ICD-10-CM | POA: Diagnosis not present

## 2023-10-14 DIAGNOSIS — R269 Unspecified abnormalities of gait and mobility: Secondary | ICD-10-CM

## 2023-10-14 DIAGNOSIS — R278 Other lack of coordination: Secondary | ICD-10-CM

## 2023-10-14 DIAGNOSIS — R2681 Unsteadiness on feet: Secondary | ICD-10-CM

## 2023-10-14 NOTE — Therapy (Addendum)
 OUTPATIENT OCCUPATIONAL THERAPY NEURO TREATMENT NOTE  Patient Name: Daniel Daugherty MRN: [id number] DOB:[birth year], 58 y.o., male Today's Date: 10/14/2023  PCP: N/A REFERRING PROVIDER: Norval Morton, MD  END OF SESSION:  OT End of Session - 10/14/23 1413     Visit Number 4    Number of Visits 24    Date for OT Re-Evaluation 12/21/23    OT Start Time 0845   OT Stop Time 930    OT Time Calculation (min) 45 min    Activity Tolerance Patient tolerated treatment well    Behavior During Therapy WFL for tasks assessed/performed             Past Medical History:  Diagnosis Date   Hypertension    Past Surgical History:  Procedure Laterality Date   DG HAND LEFT COMPLETE ([facility] HX) Left 04/13/1999   There are no active problems to display for this patient.   ONSET DATE: 05/2021  REFERRING DIAG: Parkinson's Disease  THERAPY DIAG:  Muscle weakness (generalized)  Rationale for Evaluation and Treatment: Rehabilitation  SUBJECTIVE:   SUBJECTIVE STATEMENT: Pt. reports that his arm is starting to loosen up. Pt accompanied by: self  PERTINENT HISTORY:   Pt. is a 58 y.o. male who was diagnosed with Parkinson's Disease in 2022. Pt. reports having had a progressive decline in function since having hernia surgery, and right shoulder rotator cuff surgery. PMHx included: HTN. Of note; Pt. was being followed by [facility], and receiving Home health therapy services. Pt. changed physicians, and was referred for outpatient rehab services here.  PRECAUTIONS: None  WEIGHT BEARING RESTRICTIONS: No  PAIN:  Are you having pain? 2/10 Neck, right shoulder. Pt. Has a history of a right shoulder rotator cuff injury/repair  FALLS: Has patient fallen in last 6 months? No, 1 small slip near the bed tripped over shoes  LIVING ENVIRONMENT: Lives with: lives alone Lives in: [place],  Stairs: Entry level Has following equipment at home: Quad cane small base, rollator, shower chair, grab  bars, hand rail at [employer]  PLOF: Independent  PATIENT GOALS: Improve self-care  OBJECTIVE:  Note: Objective measures were completed at Evaluation unless otherwise noted.  HAND DOMINANCE: Right  ADLs:  Assist at home:  Ex wife, and children assist several times a week  Transfers/ambulation related to ADLs: Modified Independent Eating: Difficulty using dominant right hand. Drops food when scooping, Both hands for stabilizing drinks, and uses a straw. Unable to cut food.  Grooming: Increased time required for all grooming tasks. UB Dressing: Difficulty managing buttons LB Dressing: Less assist with pants with elastic, more assist with dress pants Toileting: Increased time for hygiene. Bathing: Requires increased time to complete Tub Shower transfers: Modified Independence with increased time  IADLs: Shopping: Son assists with grocery shopping Light housekeeping: Son's assist. Pt. Does light rising of dishes off. Meal Prep: Increased time. Independent with microwave, and air fryer. Difficulty opening bottles, containers Community mobility:  Transportation, light driving to grocery store Medication management:  Son's set-up weekly pillbox, Pt. Is responsible for taking the medications Financial management: Son's assist wit monthly finances Handwriting: 50% legible Hobbies: Dancing, watching sports Career/work: Restaurant manager, fast food  MOBILITY STATUS: Uses a cane; shuffling gait  FUNCTIONAL OUTCOME MEASURES:  TBD  UPPER EXTREMITY ROM:    Active ROM Right eval Left Eval WFL overall  Shoulder flexion [facility]   Shoulder abduction 52(100)   Shoulder adduction    Shoulder extension    Shoulder internal rotation    Shoulder external rotation  Elbow flexion WFL   Elbow extension WFL   Wrist flexion    Wrist extension WFL   Wrist ulnar deviation    Wrist radial deviation    Wrist pronation    Wrist supination    (Blank rows = not tested)  UPPER EXTREMITY MMT:     MMT  Right eval Left Eval 4-/5 overall  Shoulder flexion 3/5   Shoulder abduction 2/5   Shoulder adduction    Shoulder extension    Shoulder internal rotation    Shoulder external rotation    Middle trapezius    Lower trapezius    Elbow flexion 3/5   Elbow extension 3/5   Wrist flexion    Wrist extension 3/5   Wrist ulnar deviation    Wrist radial deviation    Wrist pronation    Wrist supination    (Blank rows = not tested)  HAND FUNCTION:  Grip strength: Right: 50 lbs; Left: 34 lbs, Lateral pinch: Right: 17 lbs, Left: 9 lbs, 3 point pinch: Right: 12 lbs, Left: 11 lbs  COORDINATION:  9 Hole Peg test: Right: 1 min. & 46 sec; Left: 53 sec  SENSATION:  Light touch: WFL Proprioception: WFL  EDEMA: N/A  COGNITION: Overall cognitive status: Within functional limits for tasks assessed  VISION:  Does not wear glasses.  VISION ASSESSMENT: To be further assessed in functional context  PERCEPTION: WFL  PRAXIS: Impaired: Motor planning                                                                                                                         TREATMENT DATE: 10/14/23  Therapeutic Ex:  -RUE PROM was performed with slow prolonged gentle stretching in all joint planes of the UE, and hand followed by AAROM. -Progressive gross grip strengthening with 17.9# of grip strength resistive force.   Manual Therapy:  -Soft tissue massage was performed to the scapular, and UE musculature 2/2 tightness -Pt. tolerated scapular mobilization for elevation, depression, and abduction/rotation in sitting to normalize tone, decrease tightness, and prepare for ROM.   -Manual therapy was performed independent of, and in preparation for therapeutic Ex.      PATIENT EDUCATION: Education details: UE ROM, ADL functional status Person educated: Patient Education method: Explanation, Demonstration, Tactile cues, and Verbal cues Education comprehension: needs further education  HOME  EXERCISE PROGRAM:  Continue to assess, and provide as indicated.   GOALS: Goals reviewed with patient? Yes  SHORT TERM GOALS: Target date: 11/09/2023    Pt. Will be independent with HEPs for RUE. Baseline: Eval: No current HEP Goal status: INITIAL   LONG TERM GOALS: Target date: 12/21/2023  1.  Pt. Will increase right shoulder abduction by 10 degrees to assist with UE dressing Baseline: Eval: Right 52(100) Goal status: INITIAL  2.  Pt. Will increase BUE strength by 2 mm grades to assist with ADLs. Baseline: Eval: Right: shoulder flexion: 3/5, abduction: 2/5, elbow flexion/extension: 3/5, wrist extension: 3/5. Left: 4-/5 overall Goal  status: INITIAL  3.  Pt. Will perform self-feeding with modified independence Baseline: Eval: Difficulty using dominant right hand. Drops food when scooping, Both hands for stabilizing drinks, and uses a straw. Unable to cut food. Goal status: INITIAL  4.  Pt. Will demonstrate independence with proper A/E techniques/compensatory strategies for ADL/IADLs.  Baseline: Eval: Education to be provided Goal status: INITIAL  5.  Pt. Will write a sentence with 75% legibility with modified independence Baseline: Eval: 50% legibility for name only. Goal status: INITIAL   ASSESSMENT:  CLINICAL IMPRESSION:  Pt. continues to present with less pain today, however does report neck pain. Pt. requires verbal cues, and cues for proper form, and technique with the hand strengthening exercises. Pt. continues to benefit from OT services to work on improving bilateral UE functioning in order to maximize engagement in, and efficiency in ADLs, and IADL tasks, and provide education about compensatory strategies.   PERFORMANCE DEFICITS: in functional skills including ADLs, IADLs, coordination, dexterity, proprioception, sensation, ROM, strength, pain, Fine motor control, Gross motor control, and UE functional use, cognitive skills including , and psychosocial skills  including coping strategies, environmental adaptation, and routines and behaviors.   IMPAIRMENTS: are limiting patient from ADLs, IADLs, and leisure.   CO-MORBIDITIES: may have co-morbidities  that affects occupational performance. Patient will benefit from skilled OT to address above impairments and improve overall function.  MODIFICATION OR ASSISTANCE TO COMPLETE EVALUATION: Min-Moderate modification of tasks or assist with assess necessary to complete an evaluation.  OT OCCUPATIONAL PROFILE AND HISTORY: Detailed assessment: Review of records and additional review of physical, cognitive, psychosocial history related to current functional performance.  CLINICAL DECISION MAKING: Moderate - several treatment options, min-mod task modification necessary  REHAB POTENTIAL: Good  EVALUATION COMPLEXITY: Moderate    PLAN:  OT FREQUENCY: 2x/week  OT DURATION: 12 weeks  PLANNED INTERVENTIONS: 97168 OT Re-evaluation, 97535 self care/ADL training, 57846 therapeutic exercise, 97530 therapeutic activity, 97112 neuromuscular re-education, 97140 manual therapy, 97018 paraffin, 96295 moist heat, 97034 contrast bath, 97760 Orthotics management and training, 28413 Splinting (initial encounter), energy conservation, patient/family education, and DME and/or AE instructions  RECOMMENDED OTHER SERVICES:   PT  CONSULTED AND AGREED WITH PLAN OF CARE: Patient  PLAN FOR NEXT SESSION:   Treatment  Olegario Messier, MS, OTR/L 10/14/2023, 2:18 PM

## 2023-10-14 NOTE — Therapy (Signed)
 OUTPATIENT PHYSICAL THERAPY NEURO TREATMENT   Patient Name: Daniel Daugherty MRN: [id number] DOB:Jul 02, 1966, 58 y.o., male Today's Date: 10/14/2023   PCP: Leanord Asal, Nelva Bush, MD  REFERRING PROVIDER: Leanord Asal, Nelva Bush, MD   END OF SESSION:   PT End of Session - 10/14/23 [phone]     Visit Number 4    Number of Visits 24    Date for PT Re-Evaluation 12/21/23    Authorization Type Medicare 2025    PT Start Time 0935    PT Stop Time 1015    PT Time Calculation (min) 40 min    Equipment Utilized During Treatment Gait belt    Activity Tolerance Patient tolerated treatment well    Behavior During Therapy WFL for tasks assessed/performed                Past Medical History:  Diagnosis Date   Hypertension    Past Surgical History:  Procedure Laterality Date   DG HAND LEFT COMPLETE ([facility] HX) Left 04/13/1999   There are no active problems to display for this patient.   ONSET DATE: 2022 is when he noticed the change and that was around the same time he was diagnosed with Parkinson's  REFERRING DIAG:  G20.A1 (ICD-10-CM) - Parkinson disease (HCC)  Z74.1 (ICD-10-CM) - Requires daily assistance for activities of daily living (ADL) and comfort needs   THERAPY DIAG:  Muscle weakness (generalized)  Other lack of coordination  Abnormality of gait  Unsteadiness on feet  Generalized weakness  Rationale for Evaluation and Treatment: Rehabilitation  SUBJECTIVE:                                                                                                                                                                                             SUBJECTIVE STATEMENT:  Pt reports he is doing "great." Denies any stumbles/falls since last session. Pt reports he did 2 days of his HEP, but it made him sore in his lower back, neck, and shoulders so he had to take a couple day break. Pt reports he also went for a walk and "tried to do too much."   Pt accompanied by:  self  PERTINENT HISTORY: Parkinsonism, Vitamin D deficiency, Hyperlipidemia, HTN, poor medication compliance (per chart review)  PAIN:  Are you having pain? Yes: NPRS scale: 5/10, but states "it ain't that bad" Pain location: R shoulder and low back Pain description: throbbing in shoulder Aggravating factors: overhead movements or reaching back Relieving factors: rest and at home uses a "massager"  PRECAUTIONS: Fall  RED FLAGS: None   WEIGHT BEARING RESTRICTIONS: No  FALLS: Has patient  fallen in last 6 months? Yes. 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   PATIENT GOALS: Get my balance right with my walking, help get my R arm stronger and be able to move it more, improve strength in my legs (specifically the Right)  OBJECTIVE:  Note: Objective measures were completed at Evaluation unless otherwise noted.  DIAGNOSTIC FINDINGS: N/A  COGNITION: Overall cognitive status: Within functional limits for tasks assessed; however, did not assess higher level cognitive tasks   SENSATION: WFL Light touch on screen Pt reports R foot falls asleep sitting on toilet and he has difficult time getting feeling back in it before feeling safe standing up.  COORDINATION: Overall bradykinesia with decreased speed and amplitude of movements  EDEMA:  None present  MUSCLE TONE: Not formally assessed  MUSCLE  LENGTH: Not formally assessed, but may have some hamstring tightness on R associated with quad weakness and inability to achieve full knee extension in sitting  DTRs:  Not assessed  POSTURE: rounded shoulders, forward head, increased thoracic kyphosis, posterior pelvic tilt, and flexed trunk    LOWER EXTREMITY ROM:     Active ROM Right Eval Left Eval  Hip flexion Decreased in sitting compared to L   Hip extension    Hip abduction    Hip adduction    Hip internal rotation    Hip external rotation    Knee flexion    Knee extension Lacks full knee extension in sitting   Ankle dorsiflexion Limited in sitting Limited in sitting  Ankle plantarflexion    Ankle inversion    Ankle eversion     (Blank rows = not tested)  LOWER EXTREMITY MMT:    MMT Right Eval Left Eval  Hip flexion 3- 4+  Hip extension    Hip abduction    Hip adduction    Hip internal rotation    Hip external rotation    Knee flexion 3 4+  Knee extension 3- 4+  Ankle dorsiflexion 3- 4+  Ankle plantarflexion 3- 4+  Ankle inversion    Ankle eversion    (Blank rows = not tested)  Manual Muscle Test Scale 0/5 = No muscle contraction can be seen or felt 1/5 = Contraction can be felt, but there is no motion 2-/5 = Part moves through incomplete ROM w/ gravity decreased 2/5 = Part moves through complete ROM w/ gravity decreased 2+/5 = Part moves through incomplete ROM (<50%) against gravity or through complete ROM w/ gravity 3-/5 = Part moves through incomplete ROM (>50%) against gravity 3/5 = Part moves through complete ROM against gravity 3+/5 = Part moves through complete ROM against gravity/slight resistance 4-/5= Holds test position against slight to moderate pressure 4/5 = Part moves through complete ROM against gravity/moderate resistance 4+/5= Holds test position against moderate to strong pressure 5/5 = Part moves through complete ROM against gravity/full resistance  BED MOBILITY:  Sit to supine    Supine to sit   Need to  assess  ADL: Reports dificulty getitng on R shoe, specifically having trouble pushing his foot down in his shoe  TRANSFERS: Assistive device utilized:  Hurry cane   Sit to stand: Modified independence and SBA Stand to sit: Modified independence and SBA Chair to chair: Modified independence and SBA Floor:  would benefit from testing in future  RAMP:  Level of Assistance:    Assistive device utilized:  Hurry cane Ramp Comments: would benefit from assessing  CURB:  Level of Assistance:    Assistive device utilized:  [employer] Comments: would benefit from assessing  STAIRS: Level of Assistance: CGA and Min A Stair Negotiation Technique: Step to Pattern Forwards with Bilateral Rails Number of Stairs: 4  Height of Stairs: 6in  Comments: leading with R LE in both directions, attempts reciprocal pattern on ascent with sufficient strength, but having uncontrolled anterior lean during descent  GAIT: Gait pattern: step through pattern, decreased arm swing- Right, decreased arm swing- Left, decreased step length- Right, decreased step length- Left, decreased stride length, decreased hip/knee flexion- Right, decreased hip/knee flexion- Left, decreased ankle dorsiflexion- Right, decreased ankle dorsiflexion- Left, decreased trunk rotation, trunk flexed, poor foot clearance- Right, and poor foot clearance- Left Distance walked: 121ft Assistive device utilized: None Level of assistance: CGA Comments: significantly decreased  FUNCTIONAL TESTS:  5 times sit to stand: 38.53 seconds arms across chest Timed up and go (TUG): 19.46 seconds without AD 6 minute walk test: 10/04/2023: 5104ft (141m at 0.23m/s avg) 10 meter walk test: 0.56 m/s without AD Mini-Best: 10/04/2023: 8/28  PATIENT SURVEYS:  ABC scale 17.5%  with pt feeling <40% confident on all of the items, pt feels only 30% confident walking around the house, 10% confident getting in/out of car, and 20% reaching  for items                                                                                                                               TREATMENT DATE: 10/14/23  Unless otherwise stated, CGA/min A was provided and gait belt donned in order to ensure pt safety throughout.  Quickly reviewed with demonstration, pt's HEP.  Dynamic gait training ~522ft including fast forward walking with focus on increased step lengths and large arm swing, backwards walking 2x ~70ft, therapist facilitating increased arm swing during forward gait with pt reporting slight R shoulder discomfort so modified technique  - CGA for safety. By end of gait distance, pt demonstrating increasing gait speed and increased arm swing.   Sit>stands with hands on knees to standing with arms extended and upright posture 2x 10reps - with focus on powering up quickly into standing with full upright posture - supervision for safety. Decreased posterior LOB noted today.  Pt jokingly states he feels like he is "training for the Olympics."   Gait training ~280ft focusing on fast forward walking with increased arm swing as stated above - CGA for safety - therapist facilitating increased arm swing - continues  to have "a little" R shoulder discomfort with increased arm swing.  Dynamic balance challenge of forward/backwards and R/L lateral stepping on verbal command to external target with UE arm movements to promote large amplitude movements. CGA/light min A for balance - progressed from naming direction of stepping to naming color of target to step towards with pt having slight increased difficulty with processing this. Takes adequate step lengths with shortest being posterior.    PATIENT EDUCATION: Education details: PT POC, Goals, results of outcome measures indicating increased fall risk Person educated: Patient Education method: Explanation Education comprehension: verbalized understanding and needs further education  HOME EXERCISE  PROGRAM: Access Code: [email or web address] URL: [email or web address] Date: 10/07/2023 Prepared by: Casimiro Needle  Exercises - Seated Forward Bending  - 1 x daily - 7 x weekly - 2 sets - 10 reps - Seated Reaching to Side and Across Body  - 1 x daily - 7 x weekly - 2 sets - 10 reps - Seated Side Bend Elbow on Knee with Opposite Arm Reach Overhead -  1 x daily - 7 x weekly - 2 sets - 10 reps  GOALS: Goals reviewed with patient? Yes  SHORT TERM GOALS: Target date: 11/09/2023    Patient will be independent in home exercise program to improve strength/mobility for better functional independence with ADLs. Baseline: need to initiate Goal status: INITIAL   LONG TERM GOALS: Target date: 12/21/2023  1.  Patient (< 65 years old) will complete five times sit to stand test in < 12 seconds indicating an increased LE strength and improved balance. Baseline: 09/28/23: 38.53 seconds Goal status: INITIAL   2. Patient will reduce timed up and go to <11 seconds to reduce fall risk and demonstrate improved transfer/gait ability. Baseline: 09/28/23: 19.46 seconds without AD Goal status: INITIAL  3.  Patient will increase 10 meter walk test to >1.66m/s as to improve gait speed for better community ambulation and to reduce fall risk. Baseline: 09/28/23: 0.19m/s without AD Goal status: INITIAL  4.  Patient will increase six minute walk test distance to >1000 for progression to community ambulator and improve gait ability Baseline: 10/04/2023: 570ft (135m at 0.85m/s avg) Goal status: INITIAL  5. Patient will increase MiniBest Test score to >18/28 to indicate a reduced risk for falling and demonstrate increased independence with functional mobility and ADLs.  Baseline: 10/04/2023: 8/28  Goal status: INITIAL  6. Patient will increase ABC scale score >80% to demonstrate better functional mobility and better confidence with ADLs.   Baseline: 09/28/23: 17.5%  Goal status: INITIAL   ASSESSMENT:  CLINICAL  IMPRESSION:  Patient is a 58 y.o. male who was seen today for physical therapy treatment for R>L B LE weakness, impaired balance, impaired posture, and gait deviations due to Parkinson's disease. Therapy session focused on increased intensity of gait training targeting increased gait speed, increased step length, and increased arm swing requiring CGA/light min A for balance and repeated verbal cuing with slight manual facilitation to improve above deviations with greatest difficulty increasing arm swing. Patient continues to have bradykinesia with decreased speed and amplitude of movements (R>L). Participated in stepping training forward/backwards and laterally with pt having greatest challenge taking sufficient posterior step length. Mr. Dottavio will benefit from further skilled PT to improve these deficits in order to increase QOL and ease/safety with ADLs and functional mobility as well as decrease risk for falls.   OBJECTIVE IMPAIRMENTS: Abnormal gait, decreased activity tolerance, decreased balance, decreased coordination, decreased endurance, decreased knowledge of condition, decreased knowledge of  use of DME, decreased mobility, difficulty walking, decreased ROM, decreased strength, decreased safety awareness, improper body mechanics, postural dysfunction, and pain.   ACTIVITY LIMITATIONS: carrying, lifting, bending, standing, squatting, sleeping, stairs, transfers, bed mobility, continence, bathing, toileting, dressing, reach over head, hygiene/grooming, and locomotion level  PARTICIPATION LIMITATIONS: meal prep, cleaning, laundry, and community activity  PERSONAL FACTORS: Fitness and 3+ comorbidities: Vitamin D deficiency, Hyperlipidemia, and HTN  are also affecting patient's functional outcome.   REHAB POTENTIAL: Good  CLINICAL DECISION MAKING: Evolving/moderate complexity  EVALUATION COMPLEXITY: Moderate  PLAN:  PT FREQUENCY: 1-2x/week  PT DURATION: 12 weeks  PLANNED INTERVENTIONS:  97164- PT Re-evaluation, 97110-Therapeutic exercises, 97530- Therapeutic activity, [id number]- Neuromuscular re-education, 97535- Self Care, 30865- Manual therapy, [phone]- Gait training, [phone]- Orthotic Fit/training, [phone]- Canalith repositioning, [phone]- Electrical stimulation (manual), Patient/Family education, Balance training, Stair training, Joint mobilization, Vestibular training, Visual/preceptual remediation/compensation, DME instructions, Cryotherapy, and Moist heat  PLAN FOR NEXT SESSION:  - review and progress HEP  - assess bed mobility - stepping in different directions   - with dual-task of Blaze Pods to promote increased speed/amplitude of movements - large amplitude UE movements with promotion of improved upright posture - B LE strengthening (R>L)  - standing balance: narrow BOS/SLS, on compliant surfaces, eyes open&eyes closed - dynamic gait with focus on increased upright posture and speed of ambulation with arm swing (add AWs to UEs?)      Darryll Raju, PT, DPT, NCS, CSRS Physical Therapist - [place]  [facility]  1:35 PM 10/14/23

## 2023-10-18 ENCOUNTER — Ambulatory Visit: Payer: 59 | Admitting: Physical Therapy

## 2023-10-18 ENCOUNTER — Ambulatory Visit: Payer: 59

## 2023-10-18 NOTE — Therapy (Deleted)
 OUTPATIENT PHYSICAL THERAPY NEURO TREATMENT   Patient Name: DIARRA KOS MRN: [id number] DOB:Dec 23, 1965, 58 y.o., male Today's Date: 10/18/2023   PCP: Leanord Asal, Nelva Bush, MD  REFERRING PROVIDER: Leanord Asal, Nelva Bush, MD   END OF SESSION: ***       Past Medical History:  Diagnosis Date   Hypertension    Past Surgical History:  Procedure Laterality Date   DG HAND LEFT COMPLETE ([facility] HX) Left 04/13/1999   There are no active problems to display for this patient.   ONSET DATE: 2022 is when he noticed the change and that was around the same time he was diagnosed with Parkinson's  REFERRING DIAG:  G20.A1 (ICD-10-CM) - Parkinson disease (HCC)  Z74.1 (ICD-10-CM) - Requires daily assistance for activities of daily living (ADL) and comfort needs   THERAPY DIAG: *** No diagnosis found.  Rationale for Evaluation and Treatment: Rehabilitation  SUBJECTIVE:                                                                                                                                                                                             SUBJECTIVE STATEMENT:  ***  Pt reports he is doing "great." Denies any stumbles/falls since last session. Pt reports he did 2 days of his HEP, but it made him sore in his lower back, neck, and shoulders so he had to take a couple day break. Pt reports he also went for a walk and "tried to do too much."   Pt accompanied by: self  PERTINENT HISTORY: Parkinsonism, Vitamin D deficiency, Hyperlipidemia, HTN, poor medication compliance (per chart review)  PAIN:  Are you having pain? Yes: NPRS scale: 5/10, but states "it ain't that bad" Pain location: R shoulder and low back Pain description: throbbing in shoulder Aggravating factors: overhead movements or reaching back Relieving factors: rest and at home uses a "massager"  PRECAUTIONS: Fall  RED FLAGS: None   WEIGHT BEARING RESTRICTIONS: No  FALLS: Has patient fallen in last  6 months? Yes. 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   PATIENT GOALS: Get my  balance right with my walking, help get my R arm stronger and be able to move it more, improve strength in my legs (specifically the Right)  OBJECTIVE:  Note: Objective measures were completed at Evaluation unless otherwise noted.  DIAGNOSTIC FINDINGS: N/A  COGNITION: Overall cognitive status: Within functional limits for tasks assessed; however, did not assess higher level cognitive tasks   SENSATION: WFL Light touch on screen Pt reports R foot falls asleep sitting on toilet and he has difficult time getting feeling back in it before feeling safe standing up.  COORDINATION: Overall bradykinesia with decreased speed and amplitude of movements  EDEMA:  None present  MUSCLE TONE: Not formally assessed  MUSCLE LENGTH: Not formally assessed, but may have some hamstring tightness on R associated with quad weakness and inability to achieve full knee extension in sitting  DTRs:  Not assessed  POSTURE: rounded shoulders, forward head, increased thoracic kyphosis, posterior pelvic tilt, and flexed trunk    LOWER EXTREMITY ROM:     Active ROM Right Eval Left Eval  Hip flexion Decreased in sitting compared to L   Hip extension    Hip abduction    Hip adduction    Hip internal rotation    Hip external rotation    Knee  flexion    Knee extension Lacks full knee extension in sitting   Ankle dorsiflexion Limited in sitting Limited in sitting  Ankle plantarflexion    Ankle inversion    Ankle eversion     (Blank rows = not tested)  LOWER EXTREMITY MMT:    MMT Right Eval Left Eval  Hip flexion 3- 4+  Hip extension    Hip abduction    Hip adduction    Hip internal rotation    Hip external rotation    Knee flexion 3 4+  Knee extension 3- 4+  Ankle dorsiflexion 3- 4+  Ankle plantarflexion 3- 4+  Ankle inversion    Ankle eversion    (Blank rows = not tested)  Manual Muscle Test Scale 0/5 = No muscle contraction can be seen or felt 1/5 = Contraction can be felt, but there is no motion 2-/5 = Part moves through incomplete ROM w/ gravity decreased 2/5 = Part moves through complete ROM w/ gravity decreased 2+/5 = Part moves through incomplete ROM (<50%) against gravity or through complete ROM w/ gravity 3-/5 = Part moves through incomplete ROM (>50%) against gravity 3/5 = Part moves through complete ROM against gravity 3+/5 = Part moves through complete ROM against gravity/slight resistance 4-/5= Holds test position against slight to moderate pressure 4/5 = Part moves through complete ROM against gravity/moderate resistance 4+/5= Holds test position against moderate to strong pressure 5/5 = Part moves through complete ROM against gravity/full resistance  BED MOBILITY:  Sit to supine   Supine to sit   Need to assess  ADL: Reports dificulty getitng on R shoe, specifically having trouble pushing his foot down in his shoe  TRANSFERS: Assistive device utilized:  Hurry cane   Sit to stand: Modified independence and SBA Stand to sit: Modified independence and SBA Chair to chair: Modified independence and SBA Floor:  would benefit from testing in future  RAMP:  Level of Assistance:    Assistive device utilized:  Hurry cane Ramp Comments: would benefit from assessing  CURB:  Level of  Assistance:    Assistive device utilized:  [employer] Comments: would benefit from assessing  STAIRS: Level of Assistance: CGA and Min A Stair Negotiation Technique:  Step to Pattern Forwards with Bilateral Rails Number of Stairs: 4  Height of Stairs: 6in  Comments: leading with R LE in both directions, attempts reciprocal pattern on ascent with sufficient strength, but having uncontrolled anterior lean during descent  GAIT: Gait pattern: step through pattern, decreased arm swing- Right, decreased arm swing- Left, decreased step length- Right, decreased step length- Left, decreased stride length, decreased hip/knee flexion- Right, decreased hip/knee flexion- Left, decreased ankle dorsiflexion- Right, decreased ankle dorsiflexion- Left, decreased trunk rotation, trunk flexed, poor foot clearance- Right, and poor foot clearance- Left Distance walked: 176ft Assistive device utilized: None Level of assistance: CGA Comments: significantly decreased  FUNCTIONAL TESTS:  5 times sit to stand: 38.53 seconds arms across chest Timed up and go (TUG): 19.46 seconds without AD 6 minute walk test: 10/04/2023: 533ft (147m at 0.55m/s avg) 10 meter walk test: 0.56 m/s without AD Mini-Best: 10/04/2023: 8/28  PATIENT SURVEYS:  ABC scale 17.5%  with pt feeling <40% confident on all of the items, pt feels only 30% confident walking around the house, 10% confident getting in/out of car, and 20% reaching for items                                                                                                                               TREATMENT DATE: 10/18/23  *** - review and progress HEP  - assess bed mobility - stepping in different directions   - with dual-task of Blaze Pods to promote increased speed/amplitude of movements - large amplitude UE movements with promotion of improved upright posture - B LE strengthening (R>L)  - standing balance: narrow BOS/SLS, on compliant surfaces, eyes  open&eyes closed - dynamic gait with focus on increased upright posture and speed of ambulation with arm swing (add AWs to UEs?)   ***   Unless otherwise stated, CGA/min A was provided and gait belt donned in order to ensure pt safety throughout.  Quickly reviewed with demonstration, pt's HEP.  Dynamic gait training ~530ft including fast forward walking with focus on increased step lengths and large arm swing, backwards walking 2x ~53ft, therapist facilitating increased arm swing during forward gait with pt reporting slight R shoulder discomfort so modified technique  - CGA for safety. By end of gait distance, pt demonstrating increasing gait speed and increased arm swing.   Sit>stands with hands on knees to standing with arms extended and upright posture 2x 10reps - with focus on powering up quickly into standing with full upright posture - supervision for safety. Decreased posterior LOB noted today.  Pt jokingly states he feels like he is "training for the Olympics."   Gait training ~230ft focusing on fast forward walking with increased arm swing as stated above - CGA for safety - therapist facilitating increased arm swing - continues to have "a little" R shoulder discomfort with increased arm swing.  Dynamic balance challenge of forward/backwards and R/L lateral stepping on verbal command to external target with  UE arm movements to promote large amplitude movements. CGA/light min A for balance - progressed from naming direction of stepping to naming color of target to step towards with pt having slight increased difficulty with processing this. Takes adequate step lengths with shortest being posterior.   ***   PATIENT EDUCATION: Education details: PT POC, Goals, results of outcome measures indicating increased fall risk Person educated: Patient Education method: Explanation Education comprehension: verbalized understanding and needs further education  HOME EXERCISE PROGRAM: Access  Code: [email or web address] URL: [email or web address] Date: 10/07/2023 Prepared by: Casimiro Needle  Exercises - Seated Forward Bending  - 1 x daily - 7 x weekly - 2 sets - 10 reps - Seated Reaching to Side and Across Body  - 1 x daily - 7 x weekly - 2 sets - 10 reps - Seated Side Bend Elbow on Knee with Opposite Arm Reach Overhead -  1 x daily - 7 x weekly - 2 sets - 10 reps  GOALS: Goals reviewed with patient? Yes  SHORT TERM GOALS: Target date: 11/09/2023    Patient will be independent in home exercise program to improve strength/mobility for better functional independence with ADLs. Baseline: need to initiate Goal status: INITIAL   LONG TERM GOALS: Target date: 12/21/2023  1.  Patient (< 41 years old) will complete five times sit to stand test in < 12 seconds indicating an increased LE strength and improved balance. Baseline: 09/28/23: 38.53 seconds Goal status: INITIAL   2. Patient will reduce timed up and go to <11 seconds to reduce fall risk and demonstrate improved transfer/gait ability. Baseline: 09/28/23: 19.46 seconds without AD Goal status: INITIAL  3.  Patient will increase 10 meter walk test to >1.26m/s as to improve gait speed for better community ambulation and to reduce fall risk. Baseline: 09/28/23: 0.95m/s without AD Goal status: INITIAL  4.  Patient will increase six minute walk test distance to >1000 for progression to community ambulator and improve gait ability Baseline: 10/04/2023: 513ft (113m at 0.60m/s avg) Goal status: INITIAL  5. Patient will increase MiniBest Test score to >18/28 to indicate a reduced risk for falling and demonstrate increased independence with functional mobility and ADLs.  Baseline: 10/04/2023: 8/28  Goal status: INITIAL  6. Patient will increase ABC scale score >80% to demonstrate better functional mobility and better confidence with ADLs.   Baseline: 09/28/23: 17.5%  Goal status: INITIAL   ASSESSMENT:  CLINICAL IMPRESSION:  *** Patient is a 58 y.o. male who was seen today for physical therapy treatment for R>L B LE weakness, impaired balance, impaired posture, and gait deviations due to Parkinson's disease. Therapy session focused on increased intensity of gait training targeting increased gait speed, increased step length, and increased arm swing requiring CGA/light min A for balance and repeated verbal cuing with slight manual facilitation to improve above deviations with greatest difficulty increasing arm swing. Patient continues to have bradykinesia with decreased speed and amplitude of movements (R>L). Participated in stepping training forward/backwards and laterally with pt having greatest challenge taking sufficient posterior step length. Mr. Waltner will benefit from further skilled PT to improve these deficits in order to increase QOL and ease/safety with ADLs and functional mobility as well as decrease risk for falls.   OBJECTIVE IMPAIRMENTS: Abnormal gait, decreased activity tolerance, decreased balance, decreased coordination, decreased endurance, decreased knowledge of condition, decreased knowledge of use of DME, decreased mobility, difficulty walking, decreased ROM, decreased strength, decreased safety awareness, improper body mechanics, postural dysfunction, and pain.   ACTIVITY LIMITATIONS: carrying,  lifting, bending, standing, squatting, sleeping, stairs, transfers, bed mobility, continence, bathing, toileting, dressing, reach over head, hygiene/grooming, and locomotion level  PARTICIPATION LIMITATIONS: meal prep, cleaning, laundry, and community activity  PERSONAL FACTORS: Fitness and 3+ comorbidities: Vitamin D deficiency, Hyperlipidemia, and HTN  are also affecting patient's functional outcome.   REHAB POTENTIAL: Good  CLINICAL DECISION MAKING: Evolving/moderate complexity  EVALUATION COMPLEXITY: Moderate  PLAN:  PT FREQUENCY: 1-2x/week  PT DURATION: 12 weeks  PLANNED INTERVENTIONS: 97164- PT  Re-evaluation, 97110-Therapeutic exercises, 97530- Therapeutic activity, [id number]- Neuromuscular re-education, 97535- Self Care, 57846- Manual therapy, [phone]- Gait training, [phone]- Orthotic Fit/training, [phone]- Canalith repositioning, [phone]- Electrical stimulation (manual), Patient/Family education, Balance training, Stair training, Joint mobilization, Vestibular training, Visual/preceptual remediation/compensation, DME instructions, Cryotherapy, and Moist heat  PLAN FOR NEXT SESSION:  *** - review and progress HEP  - assess bed mobility - stepping in different directions   - with dual-task of Blaze Pods to promote increased speed/amplitude of movements - large amplitude UE movements with promotion of improved upright posture - B LE strengthening (R>L)  - standing balance: narrow BOS/SLS, on compliant surfaces, eyes open&eyes closed - dynamic gait with focus on increased upright posture and speed of ambulation with arm swing (add AWs to UEs?)      Mycal Conde, PT, DPT, NCS, CSRS Physical Therapist - [place]  [facility]  7:57 AM 10/18/23

## 2023-10-21 ENCOUNTER — Ambulatory Visit: Payer: 59 | Admitting: Occupational Therapy

## 2023-10-21 ENCOUNTER — Ambulatory Visit: Payer: 59 | Admitting: Physical Therapy

## 2023-10-21 DIAGNOSIS — M6281 Muscle weakness (generalized): Secondary | ICD-10-CM

## 2023-10-21 DIAGNOSIS — R531 Weakness: Secondary | ICD-10-CM

## 2023-10-21 DIAGNOSIS — R269 Unspecified abnormalities of gait and mobility: Secondary | ICD-10-CM

## 2023-10-21 DIAGNOSIS — R278 Other lack of coordination: Secondary | ICD-10-CM

## 2023-10-21 DIAGNOSIS — R2681 Unsteadiness on feet: Secondary | ICD-10-CM

## 2023-10-21 NOTE — Therapy (Signed)
 OUTPATIENT OCCUPATIONAL THERAPY NEURO TREATMENT NOTE  Patient Name: Daniel Daugherty MRN: [id number] DOB:April 28, 1966, 58 y.o., male Today's Date: 10/21/2023  PCP: N/A REFERRING PROVIDER: Norval Morton, MD  END OF SESSION:  OT End of Session - 10/21/23 1629     Visit Number 5    Number of Visits 24    Date for OT Re-Evaluation 12/21/23    OT Start Time 1015    OT Stop Time 1100    OT Time Calculation (min) 45 min    Activity Tolerance Patient tolerated treatment well    Behavior During Therapy WFL for tasks assessed/performed                 Past Medical History:  Diagnosis Date   Hypertension    Past Surgical History:  Procedure Laterality Date   DG HAND LEFT COMPLETE ([facility] HX) Left 04/13/1999   There are no active problems to display for this patient.   ONSET DATE: 05/2021  REFERRING DIAG: Parkinson's Disease  THERAPY DIAG:  Muscle weakness (generalized)  Rationale for Evaluation and Treatment: Rehabilitation  SUBJECTIVE:   SUBJECTIVE STATEMENT: Pt. reports that his arm is starting to loosen up. Pt accompanied by: self  PERTINENT HISTORY:   Pt. is a 58 y.o. male who was diagnosed with Parkinson's Disease in 2022. Pt. reports having had a progressive decline in function since having hernia surgery, and right shoulder rotator cuff surgery. PMHx included: HTN. Of note; Pt. was being followed by [facility], and receiving Home health therapy services. Pt. changed physicians, and was referred for outpatient rehab services here.  PRECAUTIONS: None  WEIGHT BEARING RESTRICTIONS: No  PAIN:  Are you having pain? No reports of pain  FALLS: Has patient fallen in last 6 months? No, 1 small slip near the bed tripped over shoes  LIVING ENVIRONMENT: Lives with: lives alone Lives in: [place],  Stairs: Entry level Has following equipment at home: Quad cane small base, rollator, shower chair, grab bars, hand rail at [employer]  PLOF: Independent  PATIENT GOALS:  Improve self-care  OBJECTIVE:  Note: Objective measures were completed at Evaluation unless otherwise noted.  HAND DOMINANCE: Right  ADLs:  Assist at home:  Ex wife, and children assist several times a week  Transfers/ambulation related to ADLs: Modified Independent Eating: Difficulty using dominant right hand. Drops food when scooping, Both hands for stabilizing drinks, and uses a straw. Unable to cut food.  Grooming: Increased time required for all grooming tasks. UB Dressing: Difficulty managing buttons LB Dressing: Less assist with pants with elastic, more assist with dress pants Toileting: Increased time for hygiene. Bathing: Requires increased time to complete Tub Shower transfers: Modified Independence with increased time  IADLs: Shopping: Son assists with grocery shopping Light housekeeping: Son's assist. Pt. Does light rising of dishes off. Meal Prep: Increased time. Independent with microwave, and air fryer. Difficulty opening bottles, containers Community mobility:  Transportation, light driving to grocery store Medication management:  Son's set-up weekly pillbox, Pt. Is responsible for taking the medications Financial management: Son's assist wit monthly finances Handwriting: 50% legible Hobbies: Dancing, watching sports Career/work: Restaurant manager, fast food  MOBILITY STATUS: Uses a cane; shuffling gait  FUNCTIONAL OUTCOME MEASURES:  TBD  UPPER EXTREMITY ROM:    Active ROM Right eval Left Eval WFL overall  Shoulder flexion [facility]   Shoulder abduction 52(100)   Shoulder adduction    Shoulder extension    Shoulder internal rotation    Shoulder external rotation    Elbow flexion [facility]  Elbow extension WFL   Wrist flexion    Wrist extension WFL   Wrist ulnar deviation    Wrist radial deviation    Wrist pronation    Wrist supination    (Blank rows = not tested)  UPPER EXTREMITY MMT:     MMT Right eval Left Eval 4-/5 overall  Shoulder flexion 3/5    Shoulder abduction 2/5   Shoulder adduction    Shoulder extension    Shoulder internal rotation    Shoulder external rotation    Middle trapezius    Lower trapezius    Elbow flexion 3/5   Elbow extension 3/5   Wrist flexion    Wrist extension 3/5   Wrist ulnar deviation    Wrist radial deviation    Wrist pronation    Wrist supination    (Blank rows = not tested)  HAND FUNCTION:  Grip strength: Right: 50 lbs; Left: 34 lbs, Lateral pinch: Right: 17 lbs, Left: 9 lbs, 3 point pinch: Right: 12 lbs, Left: 11 lbs  COORDINATION:  9 Hole Peg test: Right: 1 min. & 46 sec; Left: 53 sec  SENSATION:  Light touch: WFL Proprioception: WFL  EDEMA: N/A  COGNITION: Overall cognitive status: Within functional limits for tasks assessed  VISION:  Does not wear glasses.  VISION ASSESSMENT: To be further assessed in functional context  PERCEPTION: WFL  PRAXIS: Impaired: Motor planning                                                                                                                         TREATMENT DATE: 10/21/23  Therapeutic Ex:  -RUE PROM was performed with slow prolonged gentle stretching in all joint planes of the UE, and hand followed by AAROM. -Lateral, and 3pt. pinch strengthening using yellow, red, green, and blue, and black level resistive clips  -Incorporated reaching in multiple planes to further challenge the task.    Manual Therapy:  -Soft tissue massage was performed to the scapular, and UE musculature 2/2 tightness -Pt. tolerated scapular mobilization for elevation, depression, and abduction/rotation in sitting to normalize tone, decrease tightness, and prepare for ROM.   -Manual therapy was performed independent of, and in preparation for therapeutic Ex.      PATIENT EDUCATION: Education details: UE ROM, ADL functional status Person educated: Patient Education method: Explanation, Demonstration, Tactile cues, and Verbal cues Education  comprehension: needs further education  HOME EXERCISE PROGRAM:  Continue to assess, and provide as indicated.   GOALS: Goals reviewed with patient? Yes  SHORT TERM GOALS: Target date: 11/09/2023    Pt. Will be independent with HEPs for RUE. Baseline: Eval: No current HEP Goal status: INITIAL  LONG TERM GOALS: Target date: 12/21/2023  1.  Pt. Will increase right shoulder abduction by 10 degrees to assist with UE dressing Baseline: Eval: Right 52(100) Goal status: INITIAL  2.  Pt. Will increase BUE strength by 2 mm grades to assist with ADLs. Baseline: Eval: Right: shoulder flexion: 3/5, abduction:  2/5, elbow flexion/extension: 3/5, wrist extension: 3/5. Left: 4-/5 overall Goal status: INITIAL  3.  Pt. Will perform self-feeding with modified independence Baseline: Eval: Difficulty using dominant right hand. Drops food when scooping, Both hands for stabilizing drinks, and uses a straw. Unable to cut food. Goal status: INITIAL  4.  Pt. Will demonstrate independence with proper A/E techniques/compensatory strategies for ADL/IADLs.  Baseline: Eval: Education to be provided Goal status: INITIAL  5.  Pt. Will write a sentence with 75% legibility with modified independence Baseline: Eval: 50% legibility for name only. Goal status: INITIAL   ASSESSMENT:  CLINICAL IMPRESSION:  Pt. presents with improved pain today, however continues to present with tightness through the scapula. Pt. requires verbal cues, and cues for proper form, technique, and for formulating hand position with the 3pt. and lateral pinch with the resistive clips. Pt. continues to benefit from OT services to work on improving bilateral UE functioning in order to maximize engagement in, and efficiency in ADLs, and IADL tasks, and provide education about compensatory strategies.   PERFORMANCE DEFICITS: in functional skills including ADLs, IADLs, coordination, dexterity, proprioception, sensation, ROM, strength, pain,  Fine motor control, Gross motor control, and UE functional use, cognitive skills including , and psychosocial skills including coping strategies, environmental adaptation, and routines and behaviors.   IMPAIRMENTS: are limiting patient from ADLs, IADLs, and leisure.   CO-MORBIDITIES: may have co-morbidities  that affects occupational performance. Patient will benefit from skilled OT to address above impairments and improve overall function.  MODIFICATION OR ASSISTANCE TO COMPLETE EVALUATION: Min-Moderate modification of tasks or assist with assess necessary to complete an evaluation.  OT OCCUPATIONAL PROFILE AND HISTORY: Detailed assessment: Review of records and additional review of physical, cognitive, psychosocial history related to current functional performance.  CLINICAL DECISION MAKING: Moderate - several treatment options, min-mod task modification necessary  REHAB POTENTIAL: Good  EVALUATION COMPLEXITY: Moderate    PLAN:  OT FREQUENCY: 2x/week  OT DURATION: 12 weeks  PLANNED INTERVENTIONS: 97168 OT Re-evaluation, 97535 self care/ADL training, 54098 therapeutic exercise, 97530 therapeutic activity, 97112 neuromuscular re-education, 97140 manual therapy, 97018 paraffin, 11914 moist heat, 97034 contrast bath, 97760 Orthotics management and training, 78295 Splinting (initial encounter), energy conservation, patient/family education, and DME and/or AE instructions  RECOMMENDED OTHER SERVICES:   PT  CONSULTED AND AGREED WITH PLAN OF CARE: Patient  PLAN FOR NEXT SESSION:   Treatment  Olegario Messier, MS, OTR/L 10/21/2023, 4:32 PM

## 2023-10-21 NOTE — Therapy (Signed)
 OUTPATIENT PHYSICAL THERAPY NEURO TREATMENT   Patient Name: Daniel Daugherty MRN: [id number] DOB:09-29-1965, 58 y.o., male Today's Date: 10/21/2023   PCP: Leanord Asal, Nelva Bush, MD  REFERRING PROVIDER: Leanord Asal, Nelva Bush, MD   END OF SESSION:   PT End of Session - 10/21/23 0944     Visit Number 5    Number of Visits 24    Date for PT Re-Evaluation 12/21/23    Authorization Type Medicare 2025    PT Start Time 0944    PT Stop Time 1015    PT Time Calculation (min) 31 min    Equipment Utilized During Treatment Gait belt    Activity Tolerance Patient tolerated treatment well    Behavior During Therapy WFL for tasks assessed/performed                 Past Medical History:  Diagnosis Date   Hypertension    Past Surgical History:  Procedure Laterality Date   DG HAND LEFT COMPLETE ([facility] HX) Left 04/13/1999   There are no active problems to display for this patient.   ONSET DATE: 2022 is when he noticed the change and that was around the same time he was diagnosed with Parkinson's  REFERRING DIAG:  G20.A1 (ICD-10-CM) - Parkinson disease (HCC)  Z74.1 (ICD-10-CM) - Requires daily assistance for activities of daily living (ADL) and comfort needs   THERAPY DIAG:  Muscle weakness (generalized)  Other lack of coordination  Abnormality of gait  Unsteadiness on feet  Generalized weakness  Rationale for Evaluation and Treatment: Rehabilitation  SUBJECTIVE:                                                                                                                                                                                             SUBJECTIVE STATEMENT:  Pt arrived late to therapy session, reporting he thought his appointment was not until 10AM. Pt states he wasn't able to arrange transportation early enough in order to attend last visit.  Denies any stumbles/falls since last session, but states he is "moving real slow." Pt reports trying some of his  exercises but states they are making his back sore. However, pt declines therapist adjusting HEP at this time.   Pt accompanied by: self  PERTINENT HISTORY: Parkinsonism, Vitamin D deficiency, Hyperlipidemia, HTN, poor medication compliance (per chart review)  PAIN:  Are you having pain? Yes: NPRS scale: 5/10, but states "it ain't that bad" Pain location: R shoulder and low back Pain description: throbbing in shoulder Aggravating factors: overhead movements or reaching back Relieving factors: rest and at home uses a "massager"  PRECAUTIONS: Fall  RED FLAGS: None   WEIGHT BEARING RESTRICTIONS: No  FALLS: Has patient fallen in last 6 months? Yes. 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   PATIENT GOALS: Get my balance right with my walking, help get my R arm stronger and be able to move it more, improve strength in my legs (specifically the Right)  OBJECTIVE:  Note: Objective measures were completed at Evaluation unless otherwise noted.  DIAGNOSTIC FINDINGS: N/A  COGNITION: Overall cognitive status: Within functional limits for tasks assessed; however, did not assess higher level cognitive tasks   SENSATION: WFL Light touch on screen Pt reports R foot falls asleep sitting on toilet and he has difficult time getting feeling back in it before feeling safe standing up.  COORDINATION: Overall  bradykinesia with decreased speed and amplitude of movements  EDEMA:  None present  MUSCLE TONE: Not formally assessed  MUSCLE LENGTH: Not formally assessed, but may have some hamstring tightness on R associated with quad weakness and inability to achieve full knee extension in sitting  DTRs:  Not assessed  POSTURE: rounded shoulders, forward head, increased thoracic kyphosis, posterior pelvic tilt, and flexed trunk    LOWER EXTREMITY ROM:     Active ROM Right Eval Left Eval  Hip flexion Decreased in sitting compared to L   Hip extension    Hip abduction    Hip adduction    Hip internal rotation    Hip external rotation    Knee flexion    Knee extension Lacks full knee extension in sitting   Ankle dorsiflexion Limited in sitting Limited in sitting  Ankle plantarflexion    Ankle inversion    Ankle eversion     (Blank rows = not tested)  LOWER EXTREMITY MMT:    MMT Right Eval Left Eval  Hip flexion 3- 4+  Hip extension    Hip abduction    Hip adduction    Hip internal rotation    Hip external rotation    Knee flexion 3 4+  Knee extension 3- 4+  Ankle dorsiflexion 3- 4+  Ankle plantarflexion 3- 4+  Ankle inversion    Ankle eversion    (Blank rows = not tested)  Manual Muscle Test Scale 0/5 = No muscle contraction can be seen or felt 1/5 = Contraction can be felt, but there is no motion 2-/5 = Part moves through incomplete ROM w/ gravity decreased 2/5 = Part moves through complete ROM w/ gravity decreased 2+/5 = Part moves through incomplete ROM (<50%) against gravity or through complete ROM w/ gravity 3-/5 = Part moves through incomplete ROM (>50%) against gravity 3/5 = Part moves through complete ROM against gravity 3+/5 = Part moves through complete ROM against gravity/slight resistance 4-/5= Holds test position against slight to moderate pressure 4/5 = Part moves through complete ROM against gravity/moderate resistance 4+/5= Holds test position against  moderate to strong pressure 5/5 = Part moves through complete ROM against gravity/full resistance  BED MOBILITY:  Sit to supine   Supine to sit   Need to assess  ADL: Reports dificulty getitng on R shoe, specifically having trouble pushing his foot down in his shoe  TRANSFERS: Assistive device utilized:  Hurry cane   Sit to stand: Modified independence and SBA Stand to sit: Modified independence and SBA Chair to chair: Modified independence and SBA Floor:  would benefit from testing in future  RAMP:  Level of Assistance:    Assistive device utilized:  Hurry cane Ramp Comments: would benefit from assessing  CURB:  Level of Assistance:    Assistive device utilized:  [employer] Comments: would benefit from assessing  STAIRS: Level of Assistance: CGA and Min A Stair Negotiation Technique: Step to Pattern Forwards with Bilateral Rails Number of Stairs: 4  Height of Stairs: 6in  Comments: leading with R LE in both directions, attempts reciprocal pattern on ascent with sufficient strength, but having uncontrolled anterior lean during descent  GAIT: Gait pattern: step through pattern, decreased arm swing- Right, decreased arm swing- Left, decreased step length- Right, decreased step length- Left, decreased stride length, decreased hip/knee flexion- Right, decreased hip/knee flexion- Left, decreased ankle dorsiflexion- Right, decreased ankle dorsiflexion- Left, decreased trunk rotation, trunk flexed, poor foot clearance- Right, and poor foot clearance- Left Distance walked: 130ft Assistive device utilized: None Level of assistance: CGA Comments: significantly decreased  FUNCTIONAL TESTS:  5 times sit to stand: 38.53 seconds arms across chest Timed up and go (TUG): 19.46 seconds without AD 6 minute walk test: 10/04/2023: 515ft (167m at 0.20m/s avg) 10 meter walk test: 0.56 m/s without AD Mini-Best: 10/04/2023: 8/28  PATIENT SURVEYS:  ABC scale 17.5%  with pt feeling <40%  confident on all of the items, pt feels only 30% confident walking around the house, 10% confident getting in/out of car, and 20% reaching for items                                                                                                                               TREATMENT DATE: 10/21/23  Pt ambulates in/out of therapy clinic using hurricane with SBA for safety.  Unless otherwise stated, CGA/min A was provided and gait belt donned in order to ensure pt safety throughout.  Dynamic gait training ~511ft, no AD, including fast forward walking with focus on increased step lengths and large arm swing - cuing for longer steps with heel strike on initial contact, upright posture with forward gaze, and larger arm swings (slight improvement with this compared to prior visits).  Dynamic stepping balance challenge with dual-task using Blaze Pods:  - lateral side stepping over yard stick - forward/backwards stepping over yard stick  **Blaze Pods placed on mirror at shoulder height to promote large amplitude, quick movements of UEs to reach to target - had targets on R side for increased use of R UE during forward/backwards stepping **requires CGA with intermittent min A primarily during backwards stepping when rotating trunk to  reach target with R arm causing posterior LOB  Gait training additional ~128ft, no AD, with focus on carryover of above cuing.   PATIENT EDUCATION: Education details: PT POC, Goals, results of outcome measures indicating increased fall risk Person educated: Patient Education method: Explanation Education comprehension: verbalized understanding and needs further education  HOME EXERCISE PROGRAM: Access Code: [email or web address] URL: [email or web address] Date: 10/07/2023 Prepared by: Casimiro Needle  Exercises - Seated Forward Bending  - 1 x daily - 7 x weekly - 2 sets - 10 reps - Seated Reaching to Side and Across Body  - 1 x daily - 7 x weekly - 2 sets - 10  reps - Seated Side Bend Elbow on Knee with Opposite Arm Reach Overhead -  1 x daily - 7 x weekly - 2 sets - 10 reps  GOALS: Goals reviewed with patient? Yes  SHORT TERM GOALS: Target date: 11/09/2023    Patient will be independent in home exercise program to improve strength/mobility for better functional independence with ADLs. Baseline: need to initiate Goal status: INITIAL   LONG TERM GOALS: Target date: 12/21/2023  1.  Patient (< 69 years old) will complete five times sit to stand test in < 12 seconds indicating an increased LE strength and improved balance. Baseline: 09/28/23: 38.53 seconds Goal status: INITIAL   2. Patient will reduce timed up and go to <11 seconds to reduce fall risk and demonstrate improved transfer/gait ability. Baseline: 09/28/23: 19.46 seconds without AD Goal status: INITIAL  3.  Patient will increase 10 meter walk test to >1.20m/s as to improve gait speed for better community ambulation and to reduce fall risk. Baseline: 09/28/23: 0.22m/s without AD Goal status: INITIAL  4.  Patient will increase six minute walk test distance to >1000 for progression to community ambulator and improve gait ability Baseline: 10/04/2023: 559ft (155m at 0.68m/s avg) Goal status: INITIAL  5. Patient will increase MiniBest Test score to >18/28 to indicate a reduced risk for falling and demonstrate increased independence with functional mobility and ADLs.  Baseline: 10/04/2023: 8/28  Goal status: INITIAL  6. Patient will increase ABC scale score >80% to demonstrate better functional mobility and better confidence with ADLs.   Baseline: 09/28/23: 17.5%  Goal status: INITIAL   ASSESSMENT:  CLINICAL IMPRESSION:  Patient is a 58 y.o. male who was seen today for physical therapy treatment for R>L B LE weakness, impaired balance, impaired posture, and gait deviations due to Parkinson's disease. Therapy session focused on increased intensity of gait training targeting increased gait  speed, increased step length, and increased arm swing requiring CGA/light min A for balance and repeated verbal cuing with slight manual facilitation to improve above deviations with greatest difficulty increasing arm swing. Patient continues to have bradykinesia with decreased speed and amplitude of movements (R>L). Participated in stepping training forward/backwards and laterally over small obstacle with dual-task challenge to promote large amplitude UE movements. Pt continues to have greatest challenge taking sufficient posterior step lengths. Mr. Paulding will benefit from further skilled PT to improve these deficits in order to increase QOL and ease/safety with ADLs and functional mobility as well as decrease risk for falls.   OBJECTIVE IMPAIRMENTS: Abnormal gait, decreased activity tolerance, decreased balance, decreased coordination, decreased endurance, decreased knowledge of condition, decreased knowledge of use of DME, decreased mobility, difficulty walking, decreased ROM, decreased strength, decreased safety awareness, improper body mechanics, postural dysfunction, and pain.   ACTIVITY LIMITATIONS: carrying, lifting, bending, standing, squatting, sleeping, stairs, transfers, bed mobility, continence, bathing, toileting, dressing,  reach over head, hygiene/grooming, and locomotion level  PARTICIPATION LIMITATIONS: meal prep, cleaning, laundry, and community activity  PERSONAL FACTORS: Fitness and 3+ comorbidities: Vitamin D deficiency, Hyperlipidemia, and HTN  are also affecting patient's functional outcome.   REHAB POTENTIAL: Good  CLINICAL DECISION MAKING: Evolving/moderate complexity  EVALUATION COMPLEXITY: Moderate  PLAN:  PT FREQUENCY: 1-2x/week  PT DURATION: 12 weeks  PLANNED INTERVENTIONS: 97164- PT Re-evaluation, 97110-Therapeutic exercises, 97530- Therapeutic activity, 97112- Neuromuscular re-education, 97535- Self Care, 24401- Manual therapy, [phone]- Gait training, [phone]- Orthotic  Fit/training, [phone]- Canalith repositioning, [phone]- Electrical stimulation (manual), Patient/Family education, Balance training, Stair training, Joint mobilization, Vestibular training, Visual/preceptual remediation/compensation, DME instructions, Cryotherapy, and Moist heat  PLAN FOR NEXT SESSION:  - review and progress HEP  - assess bed mobility - stepping in different directions   - with dual-task of Blaze Pods to promote increased speed/amplitude of movements  - can step over 1/2 foam roll next  - continue arm reaching and trunk rotation (specifically to R) - large amplitude UE movements with promotion of improved upright posture - B LE strengthening (R>L)  - standing balance: narrow BOS/SLS, on compliant surfaces, eyes open&eyes closed - dynamic gait with focus on increased upright posture and speed of ambulation with arm swing (add AWs to UEs?)      Jenie Parish, PT, DPT, NCS, CSRS Physical Therapist - [place]  [facility]  5:55 PM 10/21/23

## 2023-10-26 ENCOUNTER — Ambulatory Visit: Payer: 59 | Admitting: Physical Therapy

## 2023-10-26 ENCOUNTER — Ambulatory Visit: Payer: 59 | Admitting: Occupational Therapy

## 2023-10-26 DIAGNOSIS — M6281 Muscle weakness (generalized): Secondary | ICD-10-CM

## 2023-10-26 DIAGNOSIS — R278 Other lack of coordination: Secondary | ICD-10-CM

## 2023-10-26 DIAGNOSIS — R2681 Unsteadiness on feet: Secondary | ICD-10-CM

## 2023-10-26 DIAGNOSIS — R531 Weakness: Secondary | ICD-10-CM

## 2023-10-26 DIAGNOSIS — R269 Unspecified abnormalities of gait and mobility: Secondary | ICD-10-CM

## 2023-10-26 NOTE — Therapy (Addendum)
 OUTPATIENT OCCUPATIONAL THERAPY NEURO TREATMENT NOTE  Patient Name: Daniel Daugherty MRN: [id number] DOB:May 31, 1966, 58 y.o., male Today's Date: 10/26/2023  PCP: N/A REFERRING PROVIDER: Norval Morton, MD  END OF SESSION:  OT End of Session - 10/26/23 1335     Visit Number 6    Number of Visits 24    Date for OT Re-Evaluation 12/21/23    OT Start Time 1015    OT Stop Time 1100    OT Time Calculation (min) 45 min    Activity Tolerance Patient tolerated treatment well    Behavior During Therapy WFL for tasks assessed/performed                 Past Medical History:  Diagnosis Date   Hypertension    Past Surgical History:  Procedure Laterality Date   DG HAND LEFT COMPLETE ([facility] HX) Left 04/13/1999   There are no active problems to display for this patient.   ONSET DATE: 05/2021  REFERRING DIAG: Parkinson's Disease  THERAPY DIAG:  Muscle weakness (generalized)  Other lack of coordination  Rationale for Evaluation and Treatment: Rehabilitation  SUBJECTIVE:   SUBJECTIVE STATEMENT: Pt. reports that his arm  feels looser today Pt accompanied by: self  PERTINENT HISTORY:   Pt. is a 58 y.o. male who was diagnosed with Parkinson's Disease in 2022. Pt. reports having had a progressive decline in function since having hernia surgery, and right shoulder rotator cuff surgery. PMHx included: HTN. Of note; Pt. was being followed by [facility], and receiving Home health therapy services. Pt. changed physicians, and was referred for outpatient rehab services here.  PRECAUTIONS: None  WEIGHT BEARING RESTRICTIONS: No  PAIN:  Are you having pain? No reports of pain  FALLS: Has patient fallen in last 6 months? No, 1 small slip near the bed tripped over shoes  LIVING ENVIRONMENT: Lives with: lives alone Lives in: [place],  Stairs: Entry level Has following equipment at home: Quad cane small base, rollator, shower chair, grab bars, hand rail at [employer]  PLOF:  Independent  PATIENT GOALS: Improve self-care  OBJECTIVE:  Note: Objective measures were completed at Evaluation unless otherwise noted.  HAND DOMINANCE: Right  ADLs:  Assist at home:  Ex wife, and children assist several times a week  Transfers/ambulation related to ADLs: Modified Independent Eating: Difficulty using dominant right hand. Drops food when scooping, Both hands for stabilizing drinks, and uses a straw. Unable to cut food.  Grooming: Increased time required for all grooming tasks. UB Dressing: Difficulty managing buttons LB Dressing: Less assist with pants with elastic, more assist with dress pants Toileting: Increased time for hygiene. Bathing: Requires increased time to complete Tub Shower transfers: Modified Independence with increased time  IADLs: Shopping: Son assists with grocery shopping Light housekeeping: Son's assist. Pt. Does light rising of dishes off. Meal Prep: Increased time. Independent with microwave, and air fryer. Difficulty opening bottles, containers Community mobility:  Transportation, light driving to grocery store Medication management:  Son's set-up weekly pillbox, Pt. Is responsible for taking the medications Financial management: Son's assist wit monthly finances Handwriting: 50% legible Hobbies: Dancing, watching sports Career/work: Restaurant manager, fast food  MOBILITY STATUS: Uses a cane; shuffling gait  FUNCTIONAL OUTCOME MEASURES:  TBD  UPPER EXTREMITY ROM:    Active ROM Right eval Left Eval WFL overall  Shoulder flexion [facility]   Shoulder abduction 52(100)   Shoulder adduction    Shoulder extension    Shoulder internal rotation    Shoulder external rotation  Elbow flexion WFL   Elbow extension WFL   Wrist flexion    Wrist extension WFL   Wrist ulnar deviation    Wrist radial deviation    Wrist pronation    Wrist supination    (Blank rows = not tested)  UPPER EXTREMITY MMT:     MMT Right eval Left Eval 4-/5 overall   Shoulder flexion 3/5   Shoulder abduction 2/5   Shoulder adduction    Shoulder extension    Shoulder internal rotation    Shoulder external rotation    Middle trapezius    Lower trapezius    Elbow flexion 3/5   Elbow extension 3/5   Wrist flexion    Wrist extension 3/5   Wrist ulnar deviation    Wrist radial deviation    Wrist pronation    Wrist supination    (Blank rows = not tested)  HAND FUNCTION:  Grip strength: Right: 50 lbs; Left: 34 lbs, Lateral pinch: Right: 17 lbs, Left: 9 lbs, 3 point pinch: Right: 12 lbs, Left: 11 lbs  COORDINATION:  9 Hole Peg test: Right: 1 min. & 46 sec; Left: 53 sec  SENSATION:  Light touch: WFL Proprioception: WFL  EDEMA: N/A  COGNITION: Overall cognitive status: Within functional limits for tasks assessed  VISION:  Does not wear glasses.  VISION ASSESSMENT: To be further assessed in functional context  PERCEPTION: WFL  PRAXIS: Impaired: Motor planning                                                                                                                         TREATMENT DATE: 10/26/23  Moist heat modality to the right shoulder to inhibit tone 2/2 tightness.   Therapeutic Ex:  -A/AAROM for the RUE through all joint ranges of motion. Following manual therapy. -Progressive gross grip strengthening with 17.9# of grip strength resistive force.   -Incorporated reaching in multiple planes at the tabletop surface to further challenge the task.    Manual Therapy:  -Soft tissue massage was performed to the scapular, and UE musculature 2/2 tightness -Pt. tolerated scapular mobilization for elevation, depression, and abduction/rotation in sitting to normalize tone, decrease tightness, and prepare for ROM.   -Manual therapy was performed independent of, and in preparation for therapeutic Ex.    Neuromuscular re-education:   -facilitated right hand FMC tasks using the Grooved pegboard, grasping and 1" grooved pegs from a  horizontal position in the shallow dish, and transitioning them to a vertical position in preparation for placing them upright into the pegboard.    PATIENT EDUCATION: Education details: UE ROM, ADL functional status Person educated: Patient Education method: Explanation, Demonstration, Tactile cues, and Verbal cues Education comprehension: needs further education  HOME EXERCISE PROGRAM:  Continue to assess, and provide as indicated.   GOALS: Goals reviewed with patient? Yes  SHORT TERM GOALS: Target date: 11/09/2023    Pt. Will be independent with HEPs for RUE. Baseline: Eval: No current HEP Goal status: INITIAL  LONG TERM GOALS: Target date: 12/21/2023  1.  Pt. Will increase right shoulder abduction by 10 degrees to assist with UE dressing Baseline: Eval: Right 52(100) Goal status: INITIAL  2.  Pt. Will increase BUE strength by 2 mm grades to assist with ADLs. Baseline: Eval: Right: shoulder flexion: 3/5, abduction: 2/5, elbow flexion/extension: 3/5, wrist extension: 3/5. Left: 4-/5 overall Goal status: INITIAL  3.  Pt. Will perform self-feeding with modified independence Baseline: Eval: Difficulty using dominant right hand. Drops food when scooping, Both hands for stabilizing drinks, and uses a straw. Unable to cut food. Goal status: INITIAL  4.  Pt. Will demonstrate independence with proper A/E techniques/compensatory strategies for ADL/IADLs.  Baseline: Eval: Education to be provided Goal status: INITIAL  5.  Pt. Will write a sentence with 75% legibility with modified independence Baseline: Eval: 50% legibility for name only. Goal status: INITIAL   ASSESSMENT:  CLINICAL IMPRESSION:  Pt. was able tolerate the manual therapy, stretches, and grip strengthening exercises well. Pt. presents with difficulty reaching out to discard the pegs into a container at the tabletop surface, requiring assist/support proximally. Pt. was able to manipulate the 1" grooved pegs,with  the right hand, and turn them within the tips of his fingers. Pt. However required increased time to complete 1/2 of the grooved pegs. Pt. continues to benefit from OT services to work on improving bilateral UE functioning in order to maximize engagement in, and efficiency in ADLs, and IADL tasks, and provide education about compensatory strategies.   PERFORMANCE DEFICITS: in functional skills including ADLs, IADLs, coordination, dexterity, proprioception, sensation, ROM, strength, pain, Fine motor control, Gross motor control, and UE functional use, cognitive skills including , and psychosocial skills including coping strategies, environmental adaptation, and routines and behaviors.   IMPAIRMENTS: are limiting patient from ADLs, IADLs, and leisure.   CO-MORBIDITIES: may have co-morbidities  that affects occupational performance. Patient will benefit from skilled OT to address above impairments and improve overall function.  MODIFICATION OR ASSISTANCE TO COMPLETE EVALUATION: Min-Moderate modification of tasks or assist with assess necessary to complete an evaluation.  OT OCCUPATIONAL PROFILE AND HISTORY: Detailed assessment: Review of records and additional review of physical, cognitive, psychosocial history related to current functional performance.  CLINICAL DECISION MAKING: Moderate - several treatment options, min-mod task modification necessary  REHAB POTENTIAL: Good  EVALUATION COMPLEXITY: Moderate    PLAN:  OT FREQUENCY: 2x/week  OT DURATION: 12 weeks  PLANNED INTERVENTIONS: 97168 OT Re-evaluation, 97535 self care/ADL training, 11914 therapeutic exercise, 97530 therapeutic activity, 97112 neuromuscular re-education, 97140 manual therapy, 97018 paraffin, 78295 moist heat, 97034 contrast bath, 97760 Orthotics management and training, 62130 Splinting (initial encounter), energy conservation, patient/family education, and DME and/or AE instructions  RECOMMENDED OTHER SERVICES:    PT  CONSULTED AND AGREED WITH PLAN OF CARE: Patient  PLAN FOR NEXT SESSION:   Treatment  Olegario Messier, MS, OTR/L 10/26/2023, 1:36 PM

## 2023-10-26 NOTE — Therapy (Signed)
 OUTPATIENT PHYSICAL THERAPY NEURO TREATMENT   Patient Name: Daniel Daugherty MRN: [id number] DOB:July 05, 1966, 58 y.o., male Today's Date: 10/26/2023   PCP: Leanord Asal, Nelva Bush, MD  REFERRING PROVIDER: Leanord Asal, Nelva Bush, MD   END OF SESSION:   PT End of Session - 10/26/23 0920     Visit Number 6    Number of Visits 24    Date for PT Re-Evaluation 12/21/23    Authorization Type Medicare 2025    PT Start Time 0927    PT Stop Time 1015    PT Time Calculation (min) 48 min    Equipment Utilized During Treatment Gait belt    Activity Tolerance Patient tolerated treatment well    Behavior During Therapy WFL for tasks assessed/performed                  Past Medical History:  Diagnosis Date   Hypertension    Past Surgical History:  Procedure Laterality Date   DG HAND LEFT COMPLETE ([facility] HX) Left 04/13/1999   There are no active problems to display for this patient.   ONSET DATE: 2022 is when he noticed the change and that was around the same time he was diagnosed with Parkinson's  REFERRING DIAG:  G20.A1 (ICD-10-CM) - Parkinson disease (HCC)  Z74.1 (ICD-10-CM) - Requires daily assistance for activities of daily living (ADL) and comfort needs   THERAPY DIAG:  Muscle weakness (generalized)  Other lack of coordination  Abnormality of gait  Unsteadiness on feet  Generalized weakness  Rationale for Evaluation and Treatment: Rehabilitation  SUBJECTIVE:                                                                                                                                                                                             SUBJECTIVE STATEMENT:  Pt reports he is doing "alright." Reports he has been doing his exercises a "little bit," stating he "got slack" over the weekend. Reports "a little" bit of pain in R shoulder and neck.   Pt accompanied by: self  PERTINENT HISTORY: Parkinsonism, Vitamin D deficiency, Hyperlipidemia, HTN, poor  medication compliance (per chart review)  PAIN:  Are you having pain? Yes: NPRS scale: 5/10, but states "it ain't that bad" Pain location: R shoulder and low back Pain description: throbbing in shoulder Aggravating factors: overhead movements or reaching back Relieving factors: rest and at home uses a "massager"  PRECAUTIONS: Fall  RED FLAGS: None   WEIGHT BEARING RESTRICTIONS: No  FALLS: Has patient fallen in last 6 months? Yes. 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   PATIENT GOALS: Get my balance right with my walking, help get my R arm stronger and be able to move it more, improve strength in my legs (specifically the Right)  OBJECTIVE:  Note: Objective measures were completed at Evaluation unless otherwise noted.  DIAGNOSTIC FINDINGS: N/A  COGNITION: Overall cognitive status: Within functional limits for tasks assessed; however, did not assess higher level cognitive tasks   SENSATION: WFL Light touch on screen Pt reports R foot falls asleep sitting on toilet and he has difficult time getting feeling back in it before feeling safe standing up.  COORDINATION: Overall bradykinesia with decreased speed and amplitude of movements  EDEMA:  None present  MUSCLE TONE: Not formally assessed  MUSCLE LENGTH: Not formally assessed, but may have some hamstring tightness on R associated with quad  weakness and inability to achieve full knee extension in sitting  DTRs:  Not assessed  POSTURE: rounded shoulders, forward head, increased thoracic kyphosis, posterior pelvic tilt, and flexed trunk    LOWER EXTREMITY ROM:     Active ROM Right Eval Left Eval  Hip flexion Decreased in sitting compared to L   Hip extension    Hip abduction    Hip adduction    Hip internal rotation    Hip external rotation    Knee flexion    Knee extension Lacks full knee extension in sitting   Ankle dorsiflexion Limited in sitting Limited in sitting  Ankle plantarflexion    Ankle inversion    Ankle eversion     (Blank rows = not tested)  LOWER EXTREMITY MMT:    MMT Right Eval Left Eval  Hip flexion 3- 4+  Hip extension    Hip abduction    Hip adduction    Hip internal rotation    Hip external rotation    Knee flexion 3 4+  Knee extension 3- 4+  Ankle dorsiflexion 3- 4+  Ankle plantarflexion 3- 4+  Ankle inversion    Ankle eversion    (Blank rows = not tested)  Manual Muscle Test Scale 0/5 = No muscle contraction can be seen or felt 1/5 = Contraction can be felt, but there is no motion 2-/5 = Part moves through incomplete ROM w/ gravity decreased 2/5 = Part moves through complete ROM w/ gravity decreased 2+/5 = Part moves through incomplete ROM (<50%) against gravity or through complete ROM w/ gravity 3-/5 = Part moves through incomplete ROM (>50%) against gravity 3/5 = Part moves through complete ROM against gravity 3+/5 = Part moves through complete ROM against gravity/slight resistance 4-/5= Holds test position against slight to moderate pressure 4/5 = Part moves through complete ROM against gravity/moderate resistance 4+/5= Holds test position against moderate to strong pressure 5/5 = Part moves through complete ROM against gravity/full resistance  BED MOBILITY:  Sit to supine   Supine to sit   Need to assess  ADL: Reports dificulty getitng on R shoe, specifically  having trouble pushing his foot down in his shoe  TRANSFERS: Assistive device utilized:  Hurry cane   Sit to stand: Modified independence and SBA Stand to sit: Modified independence and SBA Chair to chair: Modified independence and SBA Floor:  would benefit from testing in future  RAMP:  Level of Assistance:    Assistive device utilized:  Hurry cane Ramp Comments: would benefit from assessing  CURB:  Level of Assistance:    Assistive device utilized:  [employer] Comments: would benefit from assessing  STAIRS: Level of Assistance: CGA and Min A Stair Negotiation Technique: Step to Pattern Forwards with Bilateral Rails Number of Stairs: 4  Height of Stairs: 6in  Comments: leading with R LE in both directions, attempts reciprocal pattern on ascent with sufficient strength, but having uncontrolled anterior lean during descent  GAIT: Gait pattern: step through pattern, decreased arm swing- Right, decreased arm swing- Left, decreased step length- Right, decreased step length- Left, decreased stride length, decreased hip/knee flexion- Right, decreased hip/knee flexion- Left, decreased ankle dorsiflexion- Right, decreased ankle dorsiflexion- Left, decreased trunk rotation, trunk flexed, poor foot clearance- Right, and poor foot clearance- Left Distance walked: 19ft Assistive device utilized: None Level of assistance: CGA Comments: significantly decreased  FUNCTIONAL TESTS:  5 times sit to stand: 38.53 seconds arms across chest Timed up and go (TUG): 19.46 seconds without AD 6 minute walk test: 10/04/2023: 53ft (123m at 0.71m/s avg) 10 meter walk test: 0.56 m/s without AD Mini-Best: 10/04/2023: 8/28  PATIENT SURVEYS:  ABC scale 17.5%  with pt feeling <40% confident on all of the items, pt feels only 30% confident walking around the house, 10% confident getting in/out of car, and 20% reaching for items                                                                                                                                TREATMENT DATE: 10/26/23  Pt ambulates in/out of therapy clinic using hurricane with SBA for safety - very slow gait speed, short, shuffled step lengths landing on toe contact, excessive thoracic kyphosis with rounded shoulders and downward gaze.  Unless otherwise stated, CGA/min A was provided and gait belt donned in order to ensure pt safety throughout.  Dynamic gait training ~178ft, no AD, targeting upright posture, increased gait speed, long step lengths, and increased arm swing.   Dynamic gait training ~359ft while performing arm swings with boxing gloves and therapist providing boxing target to increase arm swing while also focusing on increased step lengths and overall gait speed.  Discussed local [employer] group, but pt reports he is not currently interested and declines receiving hand-out information at this time.  Dynamic stepping balance challenge with dual-task using Blaze Pods:  **Blaze Pods placed on mirror at shoulder height to promote large amplitude, quick UEs movements to reach to target ( 30sec with 1.5sec delay)  - lateral side stepping over 1/2 foam roll  - 39hits - forward/backwards stepping over 1/2 foam roll  with [employer]  Pods to his side to promote trunk rotation - 30 hits R UE - reports slight shoulder discomfort when moving arm too quickly when reaching - 33 hits L UE  Pt has greater challenge with R UE but has most difficulty reaching into shoulder external rotation and abduction/flexion to reach slightly behind him *requires CGA with only 2x light min A due to minor posterior LOB *able to clear foam roll successfully every time although slow at times  Repeated sit>stands from EOM placing hands on knees progressed to arms extended in standing to promote upright trunk posture 2x10 reps with supervision and 1x posterior LOB sitting back on mat - therapist providing mirror imaging to promote increased anterior  trunk lean followed by full upright posture once in standing  Dynamic stepping balance challenge 4 directions (fwd/bck and lateral) to [employer] as external target to promote increased step length - 61min30sec with 1.5sec delay reaching 26hits *CGA primarily for balance with pt able to reach targets *cuing not to slide foot back to midline  Gait training ~117ft usig hurricane with close SBA - cuing to maintain upright trunk posture and increase step lengths for carryover from gait training during session.  Pt reports he enjoys working hard in therapy. Pt is highly motivated to improve.   PATIENT EDUCATION: Education details: PT POC, Goals, results of outcome measures indicating increased fall risk Person educated: Patient Education method: Explanation Education comprehension: verbalized understanding and needs further education  HOME EXERCISE PROGRAM: Access Code: [email or web address] URL: [email or web address] Date: 10/07/2023 Prepared by: Casimiro Needle  Exercises - Seated Forward Bending  - 1 x daily - 7 x weekly - 2 sets - 10 reps - Seated Reaching to Side and Across Body  - 1 x daily - 7 x weekly - 2 sets - 10 reps - Seated Side Bend Elbow on Knee with Opposite Arm Reach Overhead -  1 x daily - 7 x weekly - 2 sets - 10 reps  GOALS: Goals reviewed with patient? Yes  SHORT TERM GOALS: Target date: 11/09/2023    Patient will be independent in home exercise program to improve strength/mobility for better functional independence with ADLs. Baseline: need to initiate 10/07/2023: provided Goal status: IN PROGRESS   LONG TERM GOALS: Target date: 12/21/2023  1.  Patient (< 53 years old) will complete five times sit to stand test in < 12 seconds indicating an increased LE strength and improved balance. Baseline: 09/28/23: 38.53 seconds Goal status: INITIAL   2. Patient will reduce timed up and go to <11 seconds to reduce fall risk and demonstrate improved transfer/gait  ability. Baseline: 09/28/23: 19.46 seconds without AD Goal status: INITIAL  3.  Patient will increase 10 meter walk test to >1.11m/s as to improve gait speed for better community ambulation and to reduce fall risk. Baseline: 09/28/23: 0.84m/s without AD Goal status: INITIAL  4.  Patient will increase six minute walk test distance to >1000 for progression to community ambulator and improve gait ability Baseline: 10/04/2023: 552ft (162m at 0.24m/s avg) Goal status: INITIAL  5. Patient will increase MiniBest Test score to >18/28 to indicate a reduced risk for falling and demonstrate increased independence with functional mobility and ADLs.  Baseline: 10/04/2023: 8/28  Goal status: INITIAL  6. Patient will increase ABC scale score >80% to demonstrate better functional mobility and better confidence with ADLs.   Baseline: 09/28/23: 17.5%  Goal status: INITIAL   ASSESSMENT:  CLINICAL IMPRESSION:  Patient is a 58 y.o. male who was seen today  for physical therapy treatment for R>L B LE weakness, impaired balance, impaired posture, and gait deviations due to Parkinson's disease. Therapy session focused on increased intensity of gait training targeting increased gait speed, increased step lengths, and increased arm swing requiring CGA/SBA for balance with addition of boxing movements and external targets. Patient continues to have bradykinesia with decreased speed and amplitude of movements (R>L). Participated in stepping training forward/backwards and laterally over slightly taller obstacle with dual-task challenge to promote large amplitude UE movements. Pt continues to have posterior LOB bias with greatest challenge taking sufficient posterior step length to recover. Mr. Chuba will benefit from further skilled PT to improve these deficits in order to increase QOL and ease/safety with ADLs and functional mobility as well as decrease risk for falls.   OBJECTIVE IMPAIRMENTS: Abnormal gait, decreased activity  tolerance, decreased balance, decreased coordination, decreased endurance, decreased knowledge of condition, decreased knowledge of use of DME, decreased mobility, difficulty walking, decreased ROM, decreased strength, decreased safety awareness, improper body mechanics, postural dysfunction, and pain.   ACTIVITY LIMITATIONS: carrying, lifting, bending, standing, squatting, sleeping, stairs, transfers, bed mobility, continence, bathing, toileting, dressing, reach over head, hygiene/grooming, and locomotion level  PARTICIPATION LIMITATIONS: meal prep, cleaning, laundry, and community activity  PERSONAL FACTORS: Fitness and 3+ comorbidities: Vitamin D deficiency, Hyperlipidemia, and HTN  are also affecting patient's functional outcome.   REHAB POTENTIAL: Good  CLINICAL DECISION MAKING: Evolving/moderate complexity  EVALUATION COMPLEXITY: Moderate  PLAN:  PT FREQUENCY: 1-2x/week  PT DURATION: 12 weeks  PLANNED INTERVENTIONS: 97164- PT Re-evaluation, 97110-Therapeutic exercises, 97530- Therapeutic activity, 97112- Neuromuscular re-education, 97535- Self Care, 16109- Manual therapy, [phone]- Gait training, [phone]- Orthotic Fit/training, [phone]- Canalith repositioning, [phone]- Electrical stimulation (manual), Patient/Family education, Balance training, Stair training, Joint mobilization, Vestibular training, Visual/preceptual remediation/compensation, DME instructions, Cryotherapy, and Moist heat  PLAN FOR NEXT SESSION:  - review and progress HEP  - assess bed mobility - gait training with increased intensity - stepping in different directions   - with dual-task of Blaze Pods to promote increased speed/amplitude of movements  - can step over hurdle next  - continue arm reaching and trunk rotation (specifically to R) - large amplitude UE movements with promotion of improved upright posture - B LE strengthening (R>L)  - standing balance: narrow BOS/SLS, on compliant surfaces, eyes open&eyes  closed - dynamic gait with focus on increased upright posture and speed of ambulation with arm swing (add AWs to UEs?)      Dantrell Schertzer, PT, DPT, NCS, CSRS Physical Therapist - [facility]  [facility]  10:34 AM 10/26/23

## 2023-10-28 ENCOUNTER — Ambulatory Visit: Payer: 59

## 2023-10-28 ENCOUNTER — Encounter: Payer: Self-pay | Admitting: Physical Therapy

## 2023-10-28 ENCOUNTER — Ambulatory Visit: Payer: 59 | Admitting: Occupational Therapy

## 2023-10-28 DIAGNOSIS — R531 Weakness: Secondary | ICD-10-CM

## 2023-10-28 DIAGNOSIS — M6281 Muscle weakness (generalized): Secondary | ICD-10-CM

## 2023-10-28 DIAGNOSIS — R278 Other lack of coordination: Secondary | ICD-10-CM

## 2023-10-28 DIAGNOSIS — R269 Unspecified abnormalities of gait and mobility: Secondary | ICD-10-CM

## 2023-10-28 DIAGNOSIS — R2681 Unsteadiness on feet: Secondary | ICD-10-CM

## 2023-10-28 NOTE — Therapy (Signed)
 OUTPATIENT PHYSICAL THERAPY NEURO TREATMENT   Patient Name: Daniel Daugherty MRN: [id number] DOB:Dec 21, 1965, 58 y.o., male Today's Date: 10/28/2023   PCP: Leanord Asal, Nelva Bush, MD  REFERRING PROVIDER: Leanord Asal, Nelva Bush, MD   END OF SESSION:   PT End of Session - 10/28/23 [phone]     Visit Number 7    Number of Visits 24    Date for PT Re-Evaluation 12/21/23    Authorization Type Medicare 2025    PT Start Time 0930    PT Stop Time 1012    PT Time Calculation (min) 42 min    Equipment Utilized During Treatment Gait belt    Activity Tolerance Patient tolerated treatment well    Behavior During Therapy WFL for tasks assessed/performed                   Past Medical History:  Diagnosis Date   Hypertension    Past Surgical History:  Procedure Laterality Date   DG HAND LEFT COMPLETE ([facility] HX) Left 04/13/1999   There are no active problems to display for this patient.   ONSET DATE: 2022 is when he noticed the change and that was around the same time he was diagnosed with Parkinson's  REFERRING DIAG:  G20.A1 (ICD-10-CM) - Parkinson disease (HCC)  Z74.1 (ICD-10-CM) - Requires daily assistance for activities of daily living (ADL) and comfort needs   THERAPY DIAG:  Muscle weakness (generalized)  Other lack of coordination  Abnormality of gait  Unsteadiness on feet  Generalized weakness  Rationale for Evaluation and Treatment: Rehabilitation  SUBJECTIVE:                                                                                                                                                                                             SUBJECTIVE STATEMENT:   Patient reports doing well this morning and "alive and kicking". Patient reports he isn't doing them very often but did do them yesterday. He is having pain in neck and shoulders.   Pt accompanied by: self  PERTINENT HISTORY: Parkinsonism, Vitamin D deficiency, Hyperlipidemia, HTN, poor  medication compliance (per chart review)  PAIN:  Are you having pain? Yes: NPRS scale: 5/10, but states "it ain't that bad" Pain location: R shoulder and low back Pain description: throbbing in shoulder Aggravating factors: overhead movements or reaching back Relieving factors: rest and at home uses a "massager"  PRECAUTIONS: Fall  RED FLAGS: None   WEIGHT BEARING RESTRICTIONS: No  FALLS: Has patient fallen in last 6 months? Yes. 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   PATIENT GOALS: Get my balance right with my walking, help get my R arm stronger and be able to move it more, improve strength in my legs (specifically the Right)  OBJECTIVE:  Note: Objective measures were completed at Evaluation unless otherwise noted.  DIAGNOSTIC FINDINGS: N/A  COGNITION: Overall cognitive status: Within functional limits for tasks assessed; however, did not assess higher level cognitive tasks   SENSATION: WFL Light touch on screen Pt reports R foot falls asleep sitting on toilet and he has difficult time getting feeling back in it before feeling safe standing up.  COORDINATION: Overall bradykinesia with decreased speed and amplitude of movements  EDEMA:  None present  MUSCLE TONE: Not formally assessed  MUSCLE LENGTH: Not formally assessed, but may have some hamstring tightness on R associated with quad  weakness and inability to achieve full knee extension in sitting  DTRs:  Not assessed  POSTURE: rounded shoulders, forward head, increased thoracic kyphosis, posterior pelvic tilt, and flexed trunk    LOWER EXTREMITY ROM:     Active ROM Right Eval Left Eval  Hip flexion Decreased in sitting compared to L   Hip extension    Hip abduction    Hip adduction    Hip internal rotation    Hip external rotation    Knee flexion    Knee extension Lacks full knee extension in sitting   Ankle dorsiflexion Limited in sitting Limited in sitting  Ankle plantarflexion    Ankle inversion    Ankle eversion     (Blank rows = not tested)  LOWER EXTREMITY MMT:    MMT Right Eval Left Eval  Hip flexion 3- 4+  Hip extension    Hip abduction    Hip adduction    Hip internal rotation    Hip external rotation    Knee flexion 3 4+  Knee extension 3- 4+  Ankle dorsiflexion 3- 4+  Ankle plantarflexion 3- 4+  Ankle inversion    Ankle eversion    (Blank rows = not tested)  Manual Muscle Test Scale 0/5 = No muscle contraction can be seen or felt 1/5 = Contraction can be felt, but there is no motion 2-/5 = Part moves through incomplete ROM w/ gravity decreased 2/5 = Part moves through complete ROM w/ gravity decreased 2+/5 = Part moves through incomplete ROM (<50%) against gravity or through complete ROM w/ gravity 3-/5 = Part moves through incomplete ROM (>50%) against gravity 3/5 = Part moves through complete ROM against gravity 3+/5 = Part moves through complete ROM against gravity/slight resistance 4-/5= Holds test position against slight to moderate pressure 4/5 = Part moves through complete ROM against gravity/moderate resistance 4+/5= Holds test position against moderate to strong pressure 5/5 = Part moves through complete ROM against gravity/full resistance  BED MOBILITY:  Sit to supine   Supine to sit   Need to assess  ADL: Reports dificulty getitng on R shoe, specifically  having trouble pushing his foot down in his shoe  TRANSFERS:  Assistive device utilized:  Hurry cane   Sit to stand: Modified independence and SBA Stand to sit: Modified independence and SBA Chair to chair: Modified independence and SBA Floor:  would benefit from testing in future  RAMP:  Level of Assistance:    Assistive device utilized:  Hurry cane Ramp Comments: would benefit from assessing  CURB:  Level of Assistance:    Assistive device utilized:  [employer] Comments: would benefit from assessing  STAIRS: Level of Assistance: CGA and Min A Stair Negotiation Technique: Step to Pattern Forwards with Bilateral Rails Number of Stairs: 4  Height of Stairs: 6in  Comments: leading with R LE in both directions, attempts reciprocal pattern on ascent with sufficient strength, but having uncontrolled anterior lean during descent  GAIT: Gait pattern: step through pattern, decreased arm swing- Right, decreased arm swing- Left, decreased step length- Right, decreased step length- Left, decreased stride length, decreased hip/knee flexion- Right, decreased hip/knee flexion- Left, decreased ankle dorsiflexion- Right, decreased ankle dorsiflexion- Left, decreased trunk rotation, trunk flexed, poor foot clearance- Right, and poor foot clearance- Left Distance walked: 116ft Assistive device utilized: None Level of assistance: CGA Comments: significantly decreased  FUNCTIONAL TESTS:  5 times sit to stand: 38.53 seconds arms across chest Timed up and go (TUG): 19.46 seconds without AD 6 minute walk test: 10/04/2023: 510ft (152m at 0.39m/s avg) 10 meter walk test: 0.56 m/s without AD Mini-Best: 10/04/2023: 8/28  PATIENT SURVEYS:  ABC scale 17.5%  with pt feeling <40% confident on all of the items, pt feels only 30% confident walking around the house, 10% confident getting in/out of car, and 20% reaching for items                                                                                                                                TREATMENT DATE: 10/28/23   Unless otherwise stated, CGA/min A was provided and gait belt donned in order to ensure pt safety throughout.  Sit to stand from green chair 2 x 10 with arms extended in front - x 1 posterior, cues for controlled descent   Dynamic gait training ~340', no AD, targeting upright posture, increased gait speed, long step lengths, and increased arm swing. 1.5# AW on wrists with cues for increased arm swing   Dynamic gait training ~340', no AD, targeting upright posture, increased gait speed, long step lengths, and increased arm swing.with no AW - slight improvement in arm swing after weighted gait  Activity Description: Blaze Pods placed on mirror at shoulder height to promote large amplitude, quick UEs movements to reach to target. First set involves lateral stepping over 1/2 foam roll (3 cycles), second set forward/backward stepping over 1/2 foam roll with blaze pods to side (R 2 cycles, L 2 cycles) Activity Setting:  The Blaze Pod Random setting was chosen to enhance cognitive processing and agility, providing an unpredictable environment to simulate real-world scenarios, and fostering quick reactions and adaptability.  Number of Pods:  2  Duration (Time or Hit Count):  1.5 minute   Patient Stats  Hits:      Lateral stepping: 22, 27, 19 (fatigue)  Forward/backward: R -16, 16 ;L- 27, 21   Pt reports he enjoys working hard in therapy. Pt is highly motivated to improve.   PATIENT EDUCATION: Education details: PT POC, Goals, results of outcome measures indicating increased fall risk Person educated: Patient Education method: Explanation Education comprehension: verbalized understanding and needs further education  HOME EXERCISE PROGRAM: Access Code: [email or web address] URL: [email or web address] Date: 10/07/2023 Prepared by: Casimiro Needle  Exercises - Seated Forward Bending  - 1 x daily - 7 x weekly - 2 sets -  10 reps - Seated Reaching to Side and Across Body  - 1 x daily - 7 x weekly - 2 sets - 10 reps - Seated Side Bend Elbow on Knee with Opposite Arm Reach Overhead -  1 x daily - 7 x weekly - 2 sets - 10 reps  GOALS: Goals reviewed with patient? Yes  SHORT TERM GOALS: Target date: 11/09/2023    Patient will be independent in home exercise program to improve strength/mobility for better functional independence with ADLs. Baseline: need to initiate 10/07/2023: provided Goal status: IN PROGRESS   LONG TERM GOALS: Target date: 12/21/2023  1.  Patient (< 25 years old) will complete five times sit to stand test in < 12 seconds indicating an increased LE strength and improved balance. Baseline: 09/28/23: 38.53 seconds Goal status: INITIAL   2. Patient will reduce timed up and go to <11 seconds to reduce fall risk and demonstrate improved transfer/gait ability. Baseline: 09/28/23: 19.46 seconds without AD Goal status: INITIAL  3.  Patient will increase 10 meter walk test to >1.60m/s as to improve gait speed for better community ambulation and to reduce fall risk. Baseline: 09/28/23: 0.43m/s without AD Goal status: INITIAL  4.  Patient will increase six minute walk test distance to >1000 for progression to community ambulator and improve gait ability Baseline: 10/04/2023: 572ft (117m at 0.18m/s avg) Goal status: INITIAL  5. Patient will increase MiniBest Test score to >18/28 to indicate a reduced risk for falling and demonstrate increased independence with functional mobility and ADLs.  Baseline: 10/04/2023: 8/28  Goal status: INITIAL  6. Patient will increase ABC scale score >80% to demonstrate better functional mobility and better confidence with ADLs.   Baseline: 09/28/23: 17.5%  Goal status: INITIAL   ASSESSMENT:  CLINICAL IMPRESSION:  Session focused gait training with focus on increasing arm swing with weights as well as use of Blaze pods to increase step length and arm swing with good  tolerance. Continues to be limited by bradykinesia with decreased speed and amplitude of movements (R>L). Patientwill benefit from further skilled PT to improve these deficits in order to increase QOL and ease/safety with ADLs and functional mobility as well as decrease risk for falls.   OBJECTIVE IMPAIRMENTS: Abnormal gait, decreased activity tolerance, decreased balance, decreased coordination, decreased endurance, decreased knowledge of condition, decreased knowledge of use of DME, decreased mobility, difficulty walking, decreased ROM, decreased strength, decreased safety awareness, improper body mechanics, postural dysfunction, and pain.   ACTIVITY LIMITATIONS: carrying, lifting, bending, standing, squatting, sleeping, stairs, transfers, bed mobility, continence, bathing, toileting, dressing, reach over head, hygiene/grooming, and locomotion level  PARTICIPATION LIMITATIONS: meal prep, cleaning, laundry, and community activity  PERSONAL FACTORS: Fitness and 3+ comorbidities: Vitamin D deficiency, Hyperlipidemia, and HTN  are also affecting patient's functional outcome.   REHAB  POTENTIAL: Good  CLINICAL DECISION MAKING: Evolving/moderate complexity  EVALUATION COMPLEXITY: Moderate  PLAN:  PT FREQUENCY: 1-2x/week  PT DURATION: 12 weeks  PLANNED INTERVENTIONS: 97164- PT Re-evaluation, 97110-Therapeutic exercises, 97530- Therapeutic activity, 97112- Neuromuscular re-education, 97535- Self Care, 96295- Manual therapy, [phone]- Gait training, [phone]- Orthotic Fit/training, [phone]- Canalith repositioning, [phone]- Electrical stimulation (manual), Patient/Family education, Balance training, Stair training, Joint mobilization, Vestibular training, Visual/preceptual remediation/compensation, DME instructions, Cryotherapy, and Moist heat  PLAN FOR NEXT SESSION:  - review and progress HEP  - assess bed mobility - gait training with increased intensity - stepping in different directions   - with  dual-task of Blaze Pods to promote increased speed/amplitude of movements  - can step over hurdle next  - continue arm reaching and trunk rotation (specifically to R) - large amplitude UE movements with promotion of improved upright posture - B LE strengthening (R>L)  - standing balance: narrow BOS/SLS, on compliant surfaces, eyes open&eyes closed - dynamic gait with focus on increased upright posture and speed of ambulation with arm swing (add AWs to UEs?)     Maylon Peppers, PT, DPT  Physical Therapist - [facility]  [facility]  9:26 AM 10/28/23

## 2023-10-28 NOTE — Therapy (Signed)
 OUTPATIENT OCCUPATIONAL THERAPY NEURO TREATMENT NOTE  Patient Name: Daniel Daugherty MRN: [id number] DOB:[birth year], 58 y.o., male Today's Date: 10/28/2023  PCP: N/A REFERRING PROVIDER: Norval Morton, MD  END OF SESSION:  OT End of Session - 10/28/23 1639     Visit Number 7    Date for OT Re-Evaluation 12/21/23    OT Start Time 1015    OT Stop Time 1100    OT Time Calculation (min) 45 min    Activity Tolerance Patient tolerated treatment well    Behavior During Therapy WFL for tasks assessed/performed                 Past Medical History:  Diagnosis Date   Hypertension    Past Surgical History:  Procedure Laterality Date   DG HAND LEFT COMPLETE ([facility] HX) Left 04/13/1999   There are no active problems to display for this patient.   ONSET DATE: 05/2021  REFERRING DIAG: Parkinson's Disease  THERAPY DIAG:  Muscle weakness (generalized)  Other lack of coordination  Rationale for Evaluation and Treatment: Rehabilitation  SUBJECTIVE:   SUBJECTIVE STATEMENT: Pt. reports that his arm  feels looser today Pt accompanied by: self  PERTINENT HISTORY:   Pt. is a 58 y.o. male who was diagnosed with Parkinson's Disease in 2022. Pt. reports having had a progressive decline in function since having hernia surgery, and right shoulder rotator cuff surgery. PMHx included: HTN. Of note; Pt. was being followed by [facility], and receiving Home health therapy services. Pt. changed physicians, and was referred for outpatient rehab services here.  PRECAUTIONS: None  WEIGHT BEARING RESTRICTIONS: No  PAIN:  Are you having pain? Intermittent 7/10 neck pain  FALLS: Has patient fallen in last 6 months? No, 1 small slip near the bed tripped over shoes  LIVING ENVIRONMENT: Lives with: lives alone Lives in: [place],  Stairs: Entry level Has following equipment at home: Quad cane small base, rollator, shower chair, grab bars, hand rail at [employer]  PLOF: Independent  PATIENT  GOALS: Improve self-care  OBJECTIVE:  Note: Objective measures were completed at Evaluation unless otherwise noted.  HAND DOMINANCE: Right  ADLs:  Assist at home:  Ex wife, and children assist several times a week  Transfers/ambulation related to ADLs: Modified Independent Eating: Difficulty using dominant right hand. Drops food when scooping, Both hands for stabilizing drinks, and uses a straw. Unable to cut food.  Grooming: Increased time required for all grooming tasks. UB Dressing: Difficulty managing buttons LB Dressing: Less assist with pants with elastic, more assist with dress pants Toileting: Increased time for hygiene. Bathing: Requires increased time to complete Tub Shower transfers: Modified Independence with increased time  IADLs: Shopping: Son assists with grocery shopping Light housekeeping: Son's assist. Pt. Does light rising of dishes off. Meal Prep: Increased time. Independent with microwave, and air fryer. Difficulty opening bottles, containers Community mobility:  Transportation, light driving to grocery store Medication management:  Son's set-up weekly pillbox, Pt. Is responsible for taking the medications Financial management: Son's assist wit monthly finances Handwriting: 50% legible Hobbies: Dancing, watching sports Career/work: Restaurant manager, fast food  MOBILITY STATUS: Uses a cane; shuffling gait  FUNCTIONAL OUTCOME MEASURES:  TBD  UPPER EXTREMITY ROM:    Active ROM Right eval Left Eval WFL overall  Shoulder flexion [facility]   Shoulder abduction 52(100)   Shoulder adduction    Shoulder extension    Shoulder internal rotation    Shoulder external rotation    Elbow flexion WFL   Elbow  extension [facility]   Wrist flexion    Wrist extension WFL   Wrist ulnar deviation    Wrist radial deviation    Wrist pronation    Wrist supination    (Blank rows = not tested)  UPPER EXTREMITY MMT:     MMT Right eval Left Eval 4-/5 overall  Shoulder flexion 3/5    Shoulder abduction 2/5   Shoulder adduction    Shoulder extension    Shoulder internal rotation    Shoulder external rotation    Middle trapezius    Lower trapezius    Elbow flexion 3/5   Elbow extension 3/5   Wrist flexion    Wrist extension 3/5   Wrist ulnar deviation    Wrist radial deviation    Wrist pronation    Wrist supination    (Blank rows = not tested)  HAND FUNCTION:  Grip strength: Right: 50 lbs; Left: 34 lbs, Lateral pinch: Right: 17 lbs, Left: 9 lbs, 3 point pinch: Right: 12 lbs, Left: 11 lbs  COORDINATION:  9 Hole Peg test: Right: 1 min. & 46 sec; Left: 53 sec  SENSATION:  Light touch: WFL Proprioception: WFL  EDEMA: N/A  COGNITION: Overall cognitive status: Within functional limits for tasks assessed  VISION:  Does not wear glasses.  VISION ASSESSMENT: To be further assessed in functional context  PERCEPTION: WFL  PRAXIS: Impaired: Motor planning                                                                                                                         TREATMENT DATE: 10/28/23  Moist heat modality to the right shoulder to inhibit tone 2/2 tightness.   Therapeutic Ex:  -A/AAROM for the RUE through all joint ranges of motion. Following manual therapy. -AAROM for the RUE at the tabletop -Progressive gross grip strengthening with 17.9# of grip strength resistive force.     Manual Therapy:  -Soft tissue massage was performed to the scapular, and UE musculature 2/2 tightness -Pt. tolerated scapular mobilization for elevation, depression, and abduction/rotation in sitting to normalize tone, decrease tightness, and prepare for ROM.   -Manual therapy was performed independent of, and in preparation for therapeutic Ex.    Therapeutic Activities:  -bilateral Lateral, and 3pt. Pinch strengthening using yellow, red, and green resistive clips -facilitated multi-step bimanual skills focusing on bringing his bilateral hands to midline to move  the clips from the right hand to the left hand in preparation for placing the clips in the proper position on the horizontal dowel.      PATIENT EDUCATION: Education details: UE ROM, ADL functional status Person educated: Patient Education method: Explanation, Demonstration, Tactile cues, and Verbal cues Education comprehension: needs further education  HOME EXERCISE PROGRAM:  Continue to assess, and provide as indicated.   GOALS: Goals reviewed with patient? Yes  SHORT TERM GOALS: Target date: 11/09/2023    Pt. Will be independent with HEPs for RUE. Baseline: Eval: No current HEP Goal status: INITIAL  LONG TERM GOALS: Target date: 12/21/2023  1.  Pt. Will increase right shoulder abduction by 10 degrees to assist with UE dressing Baseline: Eval: Right 52(100) Goal status: INITIAL  2.  Pt. Will increase BUE strength by 2 mm grades to assist with ADLs. Baseline: Eval: Right: shoulder flexion: 3/5, abduction: 2/5, elbow flexion/extension: 3/5, wrist extension: 3/5. Left: 4-/5 overall Goal status: INITIAL  3.  Pt. Will perform self-feeding with modified independence Baseline: Eval: Difficulty using dominant right hand. Drops food when scooping, Both hands for stabilizing drinks, and uses a straw. Unable to cut food. Goal status: INITIAL  4.  Pt. Will demonstrate independence with proper A/E techniques/compensatory strategies for ADL/IADLs.  Baseline: Eval: Education to be provided Goal status: INITIAL  5.  Pt. Will write a sentence with 75% legibility with modified independence Baseline: Eval: 50% legibility for name only. Goal status: INITIAL   ASSESSMENT:  CLINICAL IMPRESSION:  Pt. continues to tolerate the manual therapy, stretches, and grip strengthening exercises well. Pt. presents with intermittent 7/10 neck pain at times. Pt. Presents with increased tone and tightness through the right scapular region. Pt. Requires verbal cues, tactile cues, and increased time to  complete multi-step bilateral hand  tasks in midline. Pt. continues to benefit from OT services to work on improving bilateral UE functioning in order to maximize engagement in, and efficiency in ADLs, and IADL tasks, and provide education about compensatory strategies.   PERFORMANCE DEFICITS: in functional skills including ADLs, IADLs, coordination, dexterity, proprioception, sensation, ROM, strength, pain, Fine motor control, Gross motor control, and UE functional use, cognitive skills including , and psychosocial skills including coping strategies, environmental adaptation, and routines and behaviors.   IMPAIRMENTS: are limiting patient from ADLs, IADLs, and leisure.   CO-MORBIDITIES: may have co-morbidities  that affects occupational performance. Patient will benefit from skilled OT to address above impairments and improve overall function.  MODIFICATION OR ASSISTANCE TO COMPLETE EVALUATION: Min-Moderate modification of tasks or assist with assess necessary to complete an evaluation.  OT OCCUPATIONAL PROFILE AND HISTORY: Detailed assessment: Review of records and additional review of physical, cognitive, psychosocial history related to current functional performance.  CLINICAL DECISION MAKING: Moderate - several treatment options, min-mod task modification necessary  REHAB POTENTIAL: Good  EVALUATION COMPLEXITY: Moderate    PLAN:  OT FREQUENCY: 2x/week  OT DURATION: 12 weeks  PLANNED INTERVENTIONS: 97168 OT Re-evaluation, 97535 self care/ADL training, 16109 therapeutic exercise, 97530 therapeutic activity, 97112 neuromuscular re-education, 97140 manual therapy, 97018 paraffin, 60454 moist heat, 97034 contrast bath, 97760 Orthotics management and training, 09811 Splinting (initial encounter), energy conservation, patient/family education, and DME and/or AE instructions  RECOMMENDED OTHER SERVICES:   PT  CONSULTED AND AGREED WITH PLAN OF CARE: Patient  PLAN FOR NEXT SESSION:    Treatment  Olegario Messier, MS, OTR/L 10/28/2023, 4:43 PM

## 2023-11-01 NOTE — Therapy (Signed)
 OUTPATIENT PHYSICAL THERAPY NEURO TREATMENT   Patient Name: Daniel Daugherty MRN: [id number] DOB:1965/08/22, 58 y.o., male Today's Date: 11/02/2023   PCP: Leanord Asal, Nelva Bush, MD  REFERRING PROVIDER: Leanord Asal, Nelva Bush, MD   END OF SESSION:   PT End of Session - 11/02/23 0935     Visit Number 8    Number of Visits 24    Date for PT Re-Evaluation 12/21/23    Authorization Type Medicare 2025    PT Start Time 0935    PT Stop Time 1013    PT Time Calculation (min) 38 min    Equipment Utilized During Treatment Gait belt    Activity Tolerance Patient tolerated treatment well    Behavior During Therapy WFL for tasks assessed/performed              Past Medical History:  Diagnosis Date   Hypertension    Past Surgical History:  Procedure Laterality Date   DG HAND LEFT COMPLETE ([facility] HX) Left 04/13/1999   There are no active problems to display for this patient.   ONSET DATE: 2022 is when he noticed the change and that was around the same time he was diagnosed with Parkinson's  REFERRING DIAG:  G20.A1 (ICD-10-CM) - Parkinson disease (HCC)  Z74.1 (ICD-10-CM) - Requires daily assistance for activities of daily living (ADL) and comfort needs   THERAPY DIAG:  Other lack of coordination  Abnormality of gait  Muscle weakness (generalized)  Unsteadiness on feet  Generalized weakness  Rationale for Evaluation and Treatment: Rehabilitation  SUBJECTIVE:                                                                                                                                                                                             SUBJECTIVE STATEMENT:    Patient reports having a good weekend. Denies any changes since last session.   Pt accompanied by: self  PERTINENT HISTORY: Parkinsonism, Vitamin D deficiency, Hyperlipidemia, HTN, poor medication compliance (per chart review)  PAIN:  Are you having pain? Yes: NPRS scale: 5/10, but states "it ain't  that bad" Pain location: R shoulder and low back Pain description: throbbing in shoulder Aggravating factors: overhead movements or reaching back Relieving factors: rest and at home uses a "massager"  PRECAUTIONS: Fall  RED FLAGS: None   WEIGHT BEARING RESTRICTIONS: No  FALLS: Has patient fallen in last 6 months? Yes. 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   PATIENT GOALS: Get my balance right with my walking, help get my R arm stronger and be able to move it more, improve strength in my legs (specifically the Right)  OBJECTIVE:  Note: Objective measures were completed at Evaluation unless otherwise noted.  DIAGNOSTIC FINDINGS: N/A  COGNITION: Overall cognitive status: Within functional limits for tasks assessed; however, did not assess higher level cognitive tasks   SENSATION: WFL Light touch on screen Pt reports R foot falls asleep sitting on toilet and he has difficult time getting feeling back in it before feeling safe standing up.  COORDINATION: Overall bradykinesia with decreased speed and amplitude of movements  EDEMA:  None present  MUSCLE TONE: Not formally assessed  MUSCLE LENGTH: Not formally assessed, but may have some hamstring tightness on R associated with quad weakness and inability to achieve full knee extension in sitting  DTRs:  Not assessed  POSTURE: rounded shoulders,  forward head, increased thoracic kyphosis, posterior pelvic tilt, and flexed trunk    LOWER EXTREMITY ROM:     Active ROM Right Eval Left Eval  Hip flexion Decreased in sitting compared to L   Hip extension    Hip abduction    Hip adduction    Hip internal rotation    Hip external rotation    Knee flexion    Knee extension Lacks full knee extension in sitting   Ankle dorsiflexion Limited in sitting Limited in sitting  Ankle plantarflexion    Ankle inversion    Ankle eversion     (Blank rows = not tested)  LOWER EXTREMITY MMT:    MMT Right Eval Left Eval  Hip flexion 3- 4+  Hip extension    Hip abduction    Hip adduction    Hip internal rotation    Hip external rotation    Knee flexion 3 4+  Knee extension 3- 4+  Ankle dorsiflexion 3- 4+  Ankle plantarflexion 3- 4+  Ankle inversion    Ankle eversion    (Blank rows = not tested)  Manual Muscle Test Scale 0/5 = No muscle contraction can be seen or felt 1/5 = Contraction can be felt, but there is no motion 2-/5 = Part moves through incomplete ROM w/ gravity decreased 2/5 = Part moves through complete ROM w/ gravity decreased 2+/5 = Part moves through incomplete ROM (<50%) against gravity or through complete ROM w/ gravity 3-/5 = Part moves through incomplete ROM (>50%) against gravity 3/5 = Part moves through complete ROM against gravity 3+/5 = Part moves through complete ROM against gravity/slight resistance 4-/5= Holds test position against slight to moderate pressure 4/5 = Part moves through complete ROM against gravity/moderate resistance 4+/5= Holds test position against moderate to strong pressure 5/5 = Part moves through complete ROM against gravity/full resistance  BED MOBILITY:  Sit to supine   Supine to sit   Need to assess  ADL: Reports dificulty getitng on R shoe, specifically having trouble pushing his foot down in his shoe  TRANSFERS: Assistive device utilized:  Hurry cane   Sit to stand:  Modified independence and SBA Stand to sit: Modified independence and SBA Chair  to chair: Modified independence and SBA Floor:  would benefit from testing in future  RAMP:  Level of Assistance:    Assistive device utilized:  Hurry cane Ramp Comments: would benefit from assessing  CURB:  Level of Assistance:    Assistive device utilized:  [employer] Comments: would benefit from assessing  STAIRS: Level of Assistance: CGA and Min A Stair Negotiation Technique: Step to Pattern Forwards with Bilateral Rails Number of Stairs: 4  Height of Stairs: 6in  Comments: leading with R LE in both directions, attempts reciprocal pattern on ascent with sufficient strength, but having uncontrolled anterior lean during descent  GAIT: Gait pattern: step through pattern, decreased arm swing- Right, decreased arm swing- Left, decreased step length- Right, decreased step length- Left, decreased stride length, decreased hip/knee flexion- Right, decreased hip/knee flexion- Left, decreased ankle dorsiflexion- Right, decreased ankle dorsiflexion- Left, decreased trunk rotation, trunk flexed, poor foot clearance- Right, and poor foot clearance- Left Distance walked: 178ft Assistive device utilized: None Level of assistance: CGA Comments: significantly decreased  FUNCTIONAL TESTS:  5 times sit to stand: 38.53 seconds arms across chest Timed up and go (TUG): 19.46 seconds without AD 6 minute walk test: 10/04/2023: 578ft (135m at 0.68m/s avg) 10 meter walk test: 0.56 m/s without AD Mini-Best: 10/04/2023: 8/28  PATIENT SURVEYS:  ABC scale 17.5%  with pt feeling <40% confident on all of the items, pt feels only 30% confident walking around the house, 10% confident getting in/out of car, and 20% reaching for items                                                                                                                               TREATMENT DATE: 11/02/23    Unless otherwise stated, CGA/min A was  provided and gait belt donned in order to ensure pt safety throughout.  Sit to stand from green chair 2 x 10 with arms extended in front   Seated large amplitude movements with arms extended out to the side and reaching across body   Standing large amplitude movements with forward step with B UE horizontal abduction x 10 leading with each LE   Dynamic gait training ~340', no AD, targeting upright posture, increased gait speed, long step lengths, and increased arm swing. 1.5# AW on wrists with cues for increased arm swing   Standing 4" step taps with no UE support 2 x 10 each LE  Standing boxing with cross body punches to therapist 3 x 1 minute - on last set, cues for increasing speed  Pt reports he enjoys working hard in therapy. Pt is highly motivated to improve.   PATIENT EDUCATION: Education details: PT POC, Goals, results of outcome measures indicating increased fall risk Person educated: Patient Education method: Explanation Education comprehension: verbalized understanding and needs further education  HOME EXERCISE PROGRAM: Access Code: [email or web address] URL: [email or web address] Date: 10/07/2023 Prepared by: Casimiro Needle  Exercises - Seated Forward Bending  - 1 x  daily - 7 x weekly - 2 sets - 10 reps - Seated Reaching to Side and Across Body  - 1 x daily - 7 x weekly - 2 sets - 10 reps - Seated Side Bend Elbow on Knee with Opposite Arm Reach Overhead -  1 x daily - 7 x weekly - 2 sets - 10 reps  GOALS: Goals reviewed with patient? Yes  SHORT TERM GOALS: Target date: 11/09/2023    Patient will be independent in home exercise program to improve strength/mobility for better functional independence with ADLs. Baseline: need to initiate 10/07/2023: provided Goal status: IN PROGRESS   LONG TERM GOALS: Target date: 12/21/2023  1.  Patient (< 72 years old) will complete five times sit to stand test in < 12 seconds indicating an increased LE strength and improved  balance. Baseline: 09/28/23: 38.53 seconds Goal status: INITIAL   2. Patient will reduce timed up and go to <11 seconds to reduce fall risk and demonstrate improved transfer/gait ability. Baseline: 09/28/23: 19.46 seconds without AD Goal status: INITIAL  3.  Patient will increase 10 meter walk test to >1.81m/s as to improve gait speed for better community ambulation and to reduce fall risk. Baseline: 09/28/23: 0.18m/s without AD Goal status: INITIAL  4.  Patient will increase six minute walk test distance to >1000 for progression to community ambulator and improve gait ability Baseline: 10/04/2023: 547ft (141m at 0.57m/s avg) Goal status: INITIAL  5. Patient will increase MiniBest Test score to >18/28 to indicate a reduced risk for falling and demonstrate increased independence with functional mobility and ADLs.  Baseline: 10/04/2023: 8/28  Goal status: INITIAL  6. Patient will increase ABC scale score >80% to demonstrate better functional mobility and better confidence with ADLs.   Baseline: 09/28/23: 17.5%  Goal status: INITIAL   ASSESSMENT:  CLINICAL IMPRESSION:   Session focused gait training with focus on increasing arm swing with weights and focused on large amplitude movements with primary focus on arm swing/movement. Continues to be limited by bradykinesia with decreased speed and amplitude of movements (R>L). Patientwill benefit from further skilled PT to improve these deficits in order to increase QOL and ease/safety with ADLs and functional mobility as well as decrease risk for falls.   OBJECTIVE IMPAIRMENTS: Abnormal gait, decreased activity tolerance, decreased balance, decreased coordination, decreased endurance, decreased knowledge of condition, decreased knowledge of use of DME, decreased mobility, difficulty walking, decreased ROM, decreased strength, decreased safety awareness, improper body mechanics, postural dysfunction, and pain.   ACTIVITY LIMITATIONS: carrying, lifting,  bending, standing, squatting, sleeping, stairs, transfers, bed mobility, continence, bathing, toileting, dressing, reach over head, hygiene/grooming, and locomotion level  PARTICIPATION LIMITATIONS: meal prep, cleaning, laundry, and community activity  PERSONAL FACTORS: Fitness and 3+ comorbidities: Vitamin D deficiency, Hyperlipidemia, and HTN  are also affecting patient's functional outcome.   REHAB POTENTIAL: Good  CLINICAL DECISION MAKING: Evolving/moderate complexity  EVALUATION COMPLEXITY: Moderate  PLAN:  PT FREQUENCY: 1-2x/week  PT DURATION: 12 weeks  PLANNED INTERVENTIONS: 97164- PT Re-evaluation, 97110-Therapeutic exercises, 97530- Therapeutic activity, [id number]- Neuromuscular re-education, 97535- Self Care, 16109- Manual therapy, [id number]- Gait training, [id number]- Orthotic Fit/training, [phone]- Canalith repositioning, [phone]- Electrical stimulation (manual), Patient/Family education, Balance training, Stair training, Joint mobilization, Vestibular training, Visual/preceptual remediation/compensation, DME instructions, Cryotherapy, and Moist heat  PLAN FOR NEXT SESSION:  - review and progress HEP  - assess bed mobility - gait training with increased intensity - stepping in different directions   - with dual-task of Blaze Pods to promote increased speed/amplitude of movements  -  can step over hurdle next  - continue arm reaching and trunk rotation (specifically to R) - large amplitude UE movements with promotion of improved upright posture - B LE strengthening (R>L)  - standing balance: narrow BOS/SLS, on compliant surfaces, eyes open&eyes closed - dynamic gait with focus on increased upright posture and speed of ambulation with arm swing (add AWs to UEs?)     Maylon Peppers, PT, DPT  Physical Therapist - [facility]  [facility]  9:35 AM 11/02/23

## 2023-11-02 ENCOUNTER — Encounter: Payer: Self-pay | Admitting: Occupational Therapy

## 2023-11-02 ENCOUNTER — Ambulatory Visit: Payer: 59 | Attending: Family Medicine

## 2023-11-02 ENCOUNTER — Encounter: Payer: Self-pay | Admitting: Physical Therapy

## 2023-11-02 ENCOUNTER — Ambulatory Visit: Payer: 59 | Admitting: Occupational Therapy

## 2023-11-02 DIAGNOSIS — M6281 Muscle weakness (generalized): Secondary | ICD-10-CM

## 2023-11-02 DIAGNOSIS — R278 Other lack of coordination: Secondary | ICD-10-CM | POA: Diagnosis present

## 2023-11-02 DIAGNOSIS — R531 Weakness: Secondary | ICD-10-CM | POA: Insufficient documentation

## 2023-11-02 DIAGNOSIS — R269 Unspecified abnormalities of gait and mobility: Secondary | ICD-10-CM | POA: Insufficient documentation

## 2023-11-02 DIAGNOSIS — R2681 Unsteadiness on feet: Secondary | ICD-10-CM | POA: Diagnosis present

## 2023-11-02 NOTE — Therapy (Signed)
 OUTPATIENT OCCUPATIONAL THERAPY NEURO TREATMENT NOTE  Patient Name: Daniel Daugherty MRN: [id number] DOB:Mar 31, 1966, 58 y.o., male Today's Date: 11/02/2023  PCP: N/A REFERRING PROVIDER: Norval Morton, MD  END OF SESSION:  OT End of Session - 11/02/23 1013     Visit Number 8    Number of Visits 24    Date for OT Re-Evaluation 12/21/23    OT Start Time 1013    OT Stop Time 1058    OT Time Calculation (min) 45 min    Activity Tolerance Patient tolerated treatment well    Behavior During Therapy WFL for tasks assessed/performed                 Past Medical History:  Diagnosis Date   Hypertension    Past Surgical History:  Procedure Laterality Date   DG HAND LEFT COMPLETE ([facility] HX) Left 04/13/1999   There are no active problems to display for this patient.   ONSET DATE: 05/2021  REFERRING DIAG: Parkinson's Disease  THERAPY DIAG:  Muscle weakness (generalized)  Other lack of coordination  Rationale for Evaluation and Treatment: Rehabilitation  SUBJECTIVE:   SUBJECTIVE STATEMENT: Pt. reports that when he eats his cereal it gets soggy because it takes too long to eat.  Pt accompanied by: self  PERTINENT HISTORY:   Pt. is a 58 y.o. male who was diagnosed with Parkinson's Disease in 2022. Pt. reports having had a progressive decline in function since having hernia surgery, and right shoulder rotator cuff surgery. PMHx included: HTN. Of note; Pt. was being followed by [facility], and receiving Home health therapy services. Pt. changed physicians, and was referred for outpatient rehab services here.  PRECAUTIONS: None  WEIGHT BEARING RESTRICTIONS: No  PAIN:  Are you having pain? Intermittent 7/10 neck pain  FALLS: Has patient fallen in last 6 months? No, 1 small slip near the bed tripped over shoes  LIVING ENVIRONMENT: Lives with: lives alone Lives in: [place],  Stairs: Entry level Has following equipment at home: Quad cane small base, rollator, shower  chair, grab bars, hand rail at [employer]  PLOF: Independent  PATIENT GOALS: Improve self-care  OBJECTIVE:  Note: Objective measures were completed at Evaluation unless otherwise noted.  HAND DOMINANCE: Right  ADLs:  Assist at home:  Ex wife, and children assist several times a week  Transfers/ambulation related to ADLs: Modified Independent Eating: Difficulty using dominant right hand. Drops food when scooping, Both hands for stabilizing drinks, and uses a straw. Unable to cut food.  Grooming: Increased time required for all grooming tasks. UB Dressing: Difficulty managing buttons LB Dressing: Less assist with pants with elastic, more assist with dress pants Toileting: Increased time for hygiene. Bathing: Requires increased time to complete Tub Shower transfers: Modified Independence with increased time  IADLs: Shopping: Son assists with grocery shopping Light housekeeping: Son's assist. Pt. Does light rising off of dishes. Meal Prep: Increased time. Independent with microwave, and air fryer. Difficulty opening bottles, containers Community mobility:  Transportation, light driving to grocery store Medication management:  Son's set-up weekly pillbox, Pt. Is responsible for taking the medications Financial management: Son's assist with monthly finances Handwriting: 50% legible Hobbies: Dancing, watching sports Career/work: Restaurant manager, fast food  MOBILITY STATUS: Uses a cane; shuffling gait  FUNCTIONAL OUTCOME MEASURES:  TBD  UPPER EXTREMITY ROM:    Active ROM Right eval Left Eval WFL overall  Shoulder flexion [facility]   Shoulder abduction 52(100)   Shoulder adduction    Shoulder extension    Shoulder  internal rotation    Shoulder external rotation    Elbow flexion WFL   Elbow extension WFL   Wrist flexion    Wrist extension WFL   Wrist ulnar deviation    Wrist radial deviation    Wrist pronation    Wrist supination    (Blank rows = not tested)  UPPER EXTREMITY  MMT:     MMT Right eval Left Eval 4-/5 overall  Shoulder flexion 3/5   Shoulder abduction 2/5   Shoulder adduction    Shoulder extension    Shoulder internal rotation    Shoulder external rotation    Middle trapezius    Lower trapezius    Elbow flexion 3/5   Elbow extension 3/5   Wrist flexion    Wrist extension 3/5   Wrist ulnar deviation    Wrist radial deviation    Wrist pronation    Wrist supination    (Blank rows = not tested)  HAND FUNCTION:  Grip strength: Right: 50 lbs; Left: 34 lbs, Lateral pinch: Right: 17 lbs, Left: 9 lbs, 3 point pinch: Right: 12 lbs, Left: 11 lbs  COORDINATION:  9 Hole Peg test: Right: 1 min. & 46 sec; Left: 53 sec  SENSATION:  Light touch: WFL Proprioception: WFL  EDEMA: N/A  COGNITION: Overall cognitive status: Within functional limits for tasks assessed  VISION:  Does not wear glasses.  VISION ASSESSMENT: To be further assessed in functional context  PERCEPTION: WFL  PRAXIS: Impaired: Motor planning                                                                                                                         TREATMENT DATE: 11/02/23  Moist heat modality to the right shoulder to inhibit tone 2/2 tightness.   Therapeutic Ex: - Red theraband provided and completed 1 set x 6 reps each: R shoulder external rotation/internal rotation, elbow flexion/extension, pronation/supination.   Therapeutic Activities: -Facilitated  [facility] skills grasping coins from a tabletop surface, placing them into a resistive container, and pushing them through the slot while isolating his 2nd digit. Required cues to utilize L hand to stabilize container and to weightbear through R forearm to minimize tremors.  - Completed scooping activity using weighted spoon to scoop beads from bowl and dump into container. No beads dropped despite mild tremors using weighted spoon however pt requires 6 rest breaks citing fatigue.   Self Care - Pt worked on Radiation protection practitioner legibility and speed to print name and address. Completed 2 lines with pen and 75% legibility; 2 lines with pencil and grip and 50% legibility.  - Discussed adapted strategies for tremor mgmt with feeding (forearm as stabilizers, tipping or turning bowl, utilizing mat to decrease friction, and adding milk incrementally).     PATIENT EDUCATION: Education details: UE ROM, ADL functional status Person educated: Patient Education method: Explanation, Demonstration, Tactile cues, and Verbal cues Education comprehension: needs further education  HOME EXERCISE PROGRAM:  Continue to assess, and  provide as indicated.   GOALS: Goals reviewed with patient? Yes  SHORT TERM GOALS: Target date: 11/09/2023    Pt. Will be independent with HEPs for RUE. Baseline: Eval: No current HEP Goal status: INITIAL  LONG TERM GOALS: Target date: 12/21/2023  1.  Pt. Will increase right shoulder abduction by 10 degrees to assist with UE dressing Baseline: Eval: Right 52(100) Goal status: INITIAL  2.  Pt. Will increase BUE strength by 2 mm grades to assist with ADLs. Baseline: Eval: Right: shoulder flexion: 3/5, abduction: 2/5, elbow flexion/extension: 3/5, wrist extension: 3/5. Left: 4-/5 overall Goal status: INITIAL  3.  Pt. Will perform self-feeding with modified independence Baseline: Eval: Difficulty using dominant right hand. Drops food when scooping, Both hands for stabilizing drinks, and uses a straw. Unable to cut food. Goal status: INITIAL  4.  Pt. Will demonstrate independence with proper A/E techniques/compensatory strategies for ADL/IADLs.  Baseline: Eval: Education to be provided Goal status: INITIAL  5.  Pt. Will write a sentence with 75% legibility with modified independence Baseline: Eval: 50% legibility for name only. Goal status: INITIAL   ASSESSMENT:  CLINICAL IMPRESSION:  Pt. continues to tolerate exercises with no increased pain, provided red theraband for HEP and  tolerated seated exercises. Educated on tremor mgmt strategies for feeding and hand writing - legibility improved to 75% with use of pen and gripper. Pt. continues to benefit from OT services to work on improving bilateral UE functioning in order to maximize engagement in, and efficiency in ADLs, and IADL tasks, and provide education about compensatory strategies.   PERFORMANCE DEFICITS: in functional skills including ADLs, IADLs, coordination, dexterity, proprioception, sensation, ROM, strength, pain, Fine motor control, Gross motor control, and UE functional use, cognitive skills including , and psychosocial skills including coping strategies, environmental adaptation, and routines and behaviors.   IMPAIRMENTS: are limiting patient from ADLs, IADLs, and leisure.   CO-MORBIDITIES: may have co-morbidities  that affects occupational performance. Patient will benefit from skilled OT to address above impairments and improve overall function.  MODIFICATION OR ASSISTANCE TO COMPLETE EVALUATION: Min-Moderate modification of tasks or assist with assess necessary to complete an evaluation.  OT OCCUPATIONAL PROFILE AND HISTORY: Detailed assessment: Review of records and additional review of physical, cognitive, psychosocial history related to current functional performance.  CLINICAL DECISION MAKING: Moderate - several treatment options, min-mod task modification necessary  REHAB POTENTIAL: Good  EVALUATION COMPLEXITY: Moderate    PLAN:  OT FREQUENCY: 2x/week  OT DURATION: 12 weeks  PLANNED INTERVENTIONS: 97168 OT Re-evaluation, 97535 self care/ADL training, 04540 therapeutic exercise, 97530 therapeutic activity, 97112 neuromuscular re-education, 97140 manual therapy, 97018 paraffin, 98119 moist heat, 97034 contrast bath, 97760 Orthotics management and training, 14782 Splinting (initial encounter), energy conservation, patient/family education, and DME and/or AE instructions  RECOMMENDED OTHER  SERVICES:   PT  CONSULTED AND AGREED WITH PLAN OF CARE: Patient  PLAN FOR NEXT SESSION:   Treatment  Kathie Dike, M.S. OTR/L  11/02/23, 10:14 AM  ascom [phone]  11/02/2023, 10:14 AM

## 2023-11-04 ENCOUNTER — Ambulatory Visit: Payer: 59 | Admitting: Occupational Therapy

## 2023-11-04 ENCOUNTER — Ambulatory Visit: Payer: 59 | Admitting: Physical Therapy

## 2023-11-04 DIAGNOSIS — R531 Weakness: Secondary | ICD-10-CM

## 2023-11-04 DIAGNOSIS — M6281 Muscle weakness (generalized): Secondary | ICD-10-CM

## 2023-11-04 DIAGNOSIS — R2681 Unsteadiness on feet: Secondary | ICD-10-CM

## 2023-11-04 DIAGNOSIS — R269 Unspecified abnormalities of gait and mobility: Secondary | ICD-10-CM

## 2023-11-04 DIAGNOSIS — R278 Other lack of coordination: Secondary | ICD-10-CM | POA: Diagnosis not present

## 2023-11-04 NOTE — Therapy (Signed)
 OUTPATIENT PHYSICAL THERAPY NEURO TREATMENT   Patient Name: Daniel Daugherty MRN: [id number] DOB:12/19/1965, 58 y.o., male Today's Date: 11/04/2023   PCP: Leanord Asal, Nelva Bush, MD  REFERRING PROVIDER: Leanord Asal, Nelva Bush, MD   END OF SESSION:   PT End of Session - 11/04/23 0909     Visit Number 9    Number of Visits 24    Date for PT Re-Evaluation 12/21/23    Authorization Type Medicare 2025    PT Start Time 0930    PT Stop Time 1014    PT Time Calculation (min) 44 min    Equipment Utilized During Treatment Gait belt    Activity Tolerance Patient tolerated treatment well    Behavior During Therapy WFL for tasks assessed/performed               Past Medical History:  Diagnosis Date   Hypertension    Past Surgical History:  Procedure Laterality Date   DG HAND LEFT COMPLETE ([facility] HX) Left 04/13/1999   There are no active problems to display for this patient.   ONSET DATE: 2022 is when he noticed the change and that was around the same time he was diagnosed with Parkinson's  REFERRING DIAG:  G20.A1 (ICD-10-CM) - Parkinson disease (HCC)  Z74.1 (ICD-10-CM) - Requires daily assistance for activities of daily living (ADL) and comfort needs   THERAPY DIAG:  Muscle weakness (generalized)  Other lack of coordination  Abnormality of gait  Unsteadiness on feet  Generalized weakness  Rationale for Evaluation and Treatment: Rehabilitation  SUBJECTIVE:                                                                                                                                                                                             SUBJECTIVE STATEMENT:   Pt reports he has been doing good. 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  Pt accompanied by: self  PERTINENT HISTORY: Parkinsonism, Vitamin D deficiency, Hyperlipidemia, HTN, poor medication compliance (per chart  review)  PAIN:  Are you having pain? Yes: NPRS scale: 5/10, but states "it ain't that bad" Pain location: R shoulder and low back Pain description: throbbing in shoulder Aggravating factors: overhead movements or reaching back Relieving factors: rest and at home uses a "massager"  PRECAUTIONS: Fall  RED FLAGS: None   WEIGHT BEARING RESTRICTIONS: No  FALLS: Has patient fallen in last 6 months? Yes. 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   PATIENT GOALS: Get my balance right with my walking, help get my R arm stronger and be able to move it more, improve strength in my legs (specifically the Right)  OBJECTIVE:  Note: Objective measures were completed at Evaluation unless otherwise noted.  DIAGNOSTIC FINDINGS: N/A  COGNITION: Overall cognitive status: Within functional limits for tasks assessed; however, did not assess higher level cognitive tasks   SENSATION: WFL Light touch on screen Pt reports R foot falls asleep sitting on toilet and he has difficult time getting feeling back in it before feeling safe standing up.  COORDINATION: Overall bradykinesia with decreased speed and amplitude of movements  EDEMA:  None present  MUSCLE TONE: Not formally assessed  MUSCLE LENGTH: Not formally assessed, but may have some hamstring tightness on R associated with quad weakness and inability to achieve  full knee extension in sitting  DTRs:  Not assessed  POSTURE: rounded shoulders, forward head, increased thoracic kyphosis, posterior pelvic tilt, and flexed trunk    LOWER EXTREMITY ROM:     Active ROM Right Eval Left Eval  Hip flexion Decreased in sitting compared to L   Hip extension    Hip abduction    Hip adduction    Hip internal rotation    Hip external rotation    Knee flexion    Knee extension Lacks full knee extension in sitting   Ankle dorsiflexion Limited in sitting Limited in sitting  Ankle plantarflexion    Ankle inversion    Ankle eversion     (Blank rows = not tested)  LOWER EXTREMITY MMT:    MMT Right Eval Left Eval  Hip flexion 3- 4+  Hip extension    Hip abduction    Hip adduction    Hip internal rotation    Hip external rotation    Knee flexion 3 4+  Knee extension 3- 4+  Ankle dorsiflexion 3- 4+  Ankle plantarflexion 3- 4+  Ankle inversion    Ankle eversion    (Blank rows = not tested)  Manual Muscle Test Scale 0/5 = No muscle contraction can be seen or felt 1/5 = Contraction can be felt, but there is no motion 2-/5 = Part moves through incomplete ROM w/ gravity decreased 2/5 = Part moves through complete ROM w/ gravity decreased 2+/5 = Part moves through incomplete ROM (<50%) against gravity or through complete ROM w/ gravity 3-/5 = Part moves through incomplete ROM (>50%) against gravity 3/5 = Part moves through complete ROM against gravity 3+/5 = Part moves through complete ROM against gravity/slight resistance 4-/5= Holds test position against slight to moderate pressure 4/5 = Part moves through complete ROM against gravity/moderate resistance 4+/5= Holds test position against moderate to strong pressure 5/5 = Part moves through complete ROM against gravity/full resistance  BED MOBILITY:  Sit to supine   Supine to sit   Need to assess  ADL: Reports dificulty getitng on R shoe, specifically having trouble pushing his foot down in  his shoe  TRANSFERS: Assistive device utilized:  Hurry cane  Sit to stand: Modified independence and SBA Stand to sit: Modified independence and SBA Chair to chair: Modified independence and SBA Floor:  would benefit from testing in future  RAMP:  Level of Assistance:    Assistive device utilized:  Hurry cane Ramp Comments: would benefit from assessing  CURB:  Level of Assistance:    Assistive device utilized:  [employer] Comments: would benefit from assessing  STAIRS: Level of Assistance: CGA and Min A Stair Negotiation Technique: Step to Pattern Forwards with Bilateral Rails Number of Stairs: 4  Height of Stairs: 6in  Comments: leading with R LE in both directions, attempts reciprocal pattern on ascent with sufficient strength, but having uncontrolled anterior lean during descent  GAIT: Gait pattern: step through pattern, decreased arm swing- Right, decreased arm swing- Left, decreased step length- Right, decreased step length- Left, decreased stride length, decreased hip/knee flexion- Right, decreased hip/knee flexion- Left, decreased ankle dorsiflexion- Right, decreased ankle dorsiflexion- Left, decreased trunk rotation, trunk flexed, poor foot clearance- Right, and poor foot clearance- Left Distance walked: 144ft Assistive device utilized: None Level of assistance: CGA Comments: significantly decreased  FUNCTIONAL TESTS:  5 times sit to stand: 38.53 seconds arms across chest Timed up and go (TUG): 19.46 seconds without AD 6 minute walk test: 10/04/2023: 523ft (181m at 0.53m/s avg) 10 meter walk test: 0.56 m/s without AD Mini-Best: 10/04/2023: 8/28  PATIENT SURVEYS:  ABC scale 17.5%  with pt feeling <40% confident on all of the items, pt feels only 30% confident walking around the house, 10% confident getting in/out of car, and 20% reaching for items                                                                                                                                TREATMENT DATE: 11/04/23   Unless otherwise stated, CGA/min A was provided and gait belt donned in order to ensure pt safety throughout.   Gait training ~365ft with 1.5lb AWs on UEs with focus on increased arm swing - including backwards gait ~77ft x2 followed by fast forward walking 12ft x2 - CGA for steadying/balance throughout. Cuing for increased arm swing and upright posture throughout. Cuing for heel strike bilaterally with increased step lengths as well as increased R step length during backwards gait with pt consistently achieving reciprocal stepping pattern.  Pt would benefit from addition of AWs and use of boxing gloves for external targets in future sessions to increase intensity  Repeated sit to stands from low mat table 2 x 10reps with arms extended wearing 1.5lb AW on each wrist - therapist continuing to provide intermittent mirroring to promote increased anterior trunk lean while rising and powering up into standing  Dynamic stepping balance over hurdle with dual-task challenge of 4 Blaze Pods on mirror at eye level height (2 on each side of the hurdle) to tap with arms while wearing 1.5lb AW on each wrist - performed  of each of the  following for 20min30sec: Side stepping over hurdle with forward arm taps to targets - 35hits Forward/backwards stepping over hurdle with sideways R arm tapping to targets, pt with difficulty tapping more than 1 target at a time because R arm gets tired trying to stay raised, requiring him to lower it in between taps to the Blaze Pods - 23 hits Fwd/back stepping with sideways L arm tapping - 29 hits with pt able to tap 4 pods in a row without his arm fatiguing    Dynamic stepping balance of 4 square stepping over obstacles (2 hurdles on anterior and posterior; 1/2 foam roll on either side of pt so he is boxed in) and performed stepping over each obstacle to tap Blaze Pods for 24min30sec, achieved 20 hits - requires CGA/light min A for steadying with  pt having greatest instability with backwards stepping  Gait training additional ~179ft, no AD, with cuing to carryover increased gait speed, step lengths, heel strike, and increased arm swing as described above.  Pt reports he enjoys working hard in therapy. Pt is highly motivated to improve.   PATIENT EDUCATION: Education details: PT POC, Goals, results of outcome measures indicating increased fall risk Person educated: Patient Education method: Explanation Education comprehension: verbalized understanding and needs further education  HOME EXERCISE PROGRAM: Access Code: [email or web address] URL: [email or web address] Date: 10/07/2023 Prepared by: Casimiro Needle  Exercises - Seated Forward Bending  - 1 x daily - 7 x weekly - 2 sets - 10 reps - Seated Reaching to Side and Across Body  - 1 x daily - 7 x weekly - 2 sets - 10 reps - Seated Side Bend Elbow on Knee with Opposite Arm Reach Overhead -  1 x daily - 7 x weekly - 2 sets - 10 reps  GOALS: Goals reviewed with patient? Yes  SHORT TERM GOALS: Target date: 11/09/2023    Patient will be independent in home exercise program to improve strength/mobility for better functional independence with ADLs. Baseline: need to initiate 10/07/2023: provided Goal status: IN PROGRESS   LONG TERM GOALS: Target date: 12/21/2023  1.  Patient (< 49 years old) will complete five times sit to stand test in < 12 seconds indicating an increased LE strength and improved balance. Baseline: 09/28/23: 38.53 seconds Goal status: INITIAL   2. Patient will reduce timed up and go to <11 seconds to reduce fall risk and demonstrate improved transfer/gait ability. Baseline: 09/28/23: 19.46 seconds without AD Goal status: INITIAL  3.  Patient will increase 10 meter walk test to >1.80m/s as to improve gait speed for better community ambulation and to reduce fall risk. Baseline: 09/28/23: 0.42m/s without AD Goal status: INITIAL  4.  Patient will increase six  minute walk test distance to >1000 for progression to community ambulator and improve gait ability Baseline: 10/04/2023: 525ft (166m at 0.10m/s avg) Goal status: INITIAL  5. Patient will increase MiniBest Test score to >18/28 to indicate a reduced risk for falling and demonstrate increased independence with functional mobility and ADLs.  Baseline: 10/04/2023: 8/28  Goal status: INITIAL  6. Patient will increase ABC scale score >80% to demonstrate better functional mobility and better confidence with ADLs.   Baseline: 09/28/23: 17.5%  Goal status: INITIAL   ASSESSMENT:  CLINICAL IMPRESSION:   Patient arrived motivated and excited to participate in therapy session. Pt participated in gait training with focus on increasing arm swing via adding weights to target large amplitude movements and increased gait speed. Focused on dynamic stepping balance training over tall  hurdles with dual-task challenge of weighted arm taps to external target. Pt would benefit from continued progression of these interventions with added weight and intensity as tolerated. Continues to be limited by bradykinesia with decreased speed and amplitude of movements (R>L). Patient will benefit from further skilled PT to improve these deficits in order to increase QOL and ease/safety with ADLs and functional mobility as well as decrease risk for falls.   OBJECTIVE IMPAIRMENTS: Abnormal gait, decreased activity tolerance, decreased balance, decreased coordination, decreased endurance, decreased knowledge of condition, decreased knowledge of use of DME, decreased mobility, difficulty walking, decreased ROM, decreased strength, decreased safety awareness, improper body mechanics, postural dysfunction, and pain.   ACTIVITY LIMITATIONS: carrying, lifting, bending, standing, squatting, sleeping, stairs, transfers, bed mobility, continence, bathing, toileting, dressing, reach over head, hygiene/grooming, and locomotion level  PARTICIPATION  LIMITATIONS: meal prep, cleaning, laundry, and community activity  PERSONAL FACTORS: Fitness and 3+ comorbidities: Vitamin D deficiency, Hyperlipidemia, and HTN  are also affecting patient's functional outcome.   REHAB POTENTIAL: Good  CLINICAL DECISION MAKING: Evolving/moderate complexity  EVALUATION COMPLEXITY: Moderate  PLAN:  PT FREQUENCY: 1-2x/week  PT DURATION: 12 weeks  PLANNED INTERVENTIONS: 97164- PT Re-evaluation, 97110-Therapeutic exercises, 97530- Therapeutic activity, 97112- Neuromuscular re-education, 97535- Self Care, 16109- Manual therapy, [phone]- Gait training, [phone]- Orthotic Fit/training, [phone]- Canalith repositioning, [phone]- Electrical stimulation (manual), Patient/Family education, Balance training, Stair training, Joint mobilization, Vestibular training, Visual/preceptual remediation/compensation, DME instructions, Cryotherapy, and Moist heat  PLAN FOR NEXT SESSION:  - *progress note* - review and progress HEP  - assess bed mobility - gait training with increased intensity - stepping in different directions   - with dual-task of Blaze Pods to promote increased speed/amplitude of movements  - can step over hurdle next  - continue arm reaching and trunk rotation (specifically to R) - large amplitude UE movements with promotion of improved upright posture - B LE strengthening (R>L)  - standing balance: narrow BOS/SLS, on compliant surfaces, eyes open&eyes closed - dynamic gait with focus on increased upright posture and speed of ambulation with arm swing (add AWs to UEs?)  - progress to floor transfers    Casimiro Needle, PT, DPT, NCS, CSRS Physical Therapist - [facility]  11:40 AM 11/04/23

## 2023-11-04 NOTE — Therapy (Signed)
 OUTPATIENT OCCUPATIONAL THERAPY NEURO TREATMENT NOTE  Patient Name: Daniel Daugherty MRN: [id number] DOB:Oct 11, 1965, 58 y.o., male Today's Date: 11/04/2023  PCP: N/A REFERRING PROVIDER: Norval Morton, MD  END OF SESSION:  OT End of Session - 11/04/23 1157     Visit Number 9    Number of Visits 24    Date for OT Re-Evaluation 12/21/23    OT Start Time 1015    OT Stop Time 1100    OT Time Calculation (min) 45 min    Activity Tolerance Patient tolerated treatment well    Behavior During Therapy WFL for tasks assessed/performed                 Past Medical History:  Diagnosis Date   Hypertension    Past Surgical History:  Procedure Laterality Date   DG HAND LEFT COMPLETE ([facility] HX) Left 04/13/1999   There are no active problems to display for this patient.   ONSET DATE: 05/2021  REFERRING DIAG: Parkinson's Disease  THERAPY DIAG:  Muscle weakness (generalized)  Rationale for Evaluation and Treatment: Rehabilitation  SUBJECTIVE:   SUBJECTIVE STATEMENT: Pt. reports that when he eats his cereal it gets soggy because it takes too long to eat.  Pt accompanied by: self  PERTINENT HISTORY:   Pt. is a 58 y.o. male who was diagnosed with Parkinson's Disease in 2022. Pt. reports having had a progressive decline in function since having hernia surgery, and right shoulder rotator cuff surgery. PMHx included: HTN. Of note; Pt. was being followed by [facility], and receiving Home health therapy services. Pt. changed physicians, and was referred for outpatient rehab services here.  PRECAUTIONS: None  WEIGHT BEARING RESTRICTIONS: No  PAIN:  Are you having pain? Intermittent 3/10 neck and right shoulder pain.  FALLS: Has patient fallen in last 6 months? No, 1 small slip near the bed tripped over shoes  LIVING ENVIRONMENT: Lives with: lives alone Lives in: [place],  Stairs: Entry level Has following equipment at home: Quad cane small base, rollator, shower chair, grab  bars, hand rail at [employer]  PLOF: Independent  PATIENT GOALS: Improve self-care  OBJECTIVE:  Note: Objective measures were completed at Evaluation unless otherwise noted.  HAND DOMINANCE: Right  ADLs:  Assist at home:  Ex wife, and children assist several times a week  Transfers/ambulation related to ADLs: Modified Independent Eating: Difficulty using dominant right hand. Drops food when scooping, Both hands for stabilizing drinks, and uses a straw. Unable to cut food.  Grooming: Increased time required for all grooming tasks. UB Dressing: Difficulty managing buttons LB Dressing: Less assist with pants with elastic, more assist with dress pants Toileting: Increased time for hygiene. Bathing: Requires increased time to complete Tub Shower transfers: Modified Independence with increased time  IADLs: Shopping: Son assists with grocery shopping Light housekeeping: Son's assist. Pt. Does light rising off of dishes. Meal Prep: Increased time. Independent with microwave, and air fryer. Difficulty opening bottles, containers Community mobility:  Transportation, light driving to grocery store Medication management:  Son's set-up weekly pillbox, Pt. Is responsible for taking the medications Financial management: Son's assist with monthly finances Handwriting: 50% legible Hobbies: Dancing, watching sports Career/work: Restaurant manager, fast food  MOBILITY STATUS: Uses a cane; shuffling gait  FUNCTIONAL OUTCOME MEASURES:  TBD  UPPER EXTREMITY ROM:    Active ROM Right eval Left Eval WFL overall  Shoulder flexion [facility]   Shoulder abduction 52(100)   Shoulder adduction    Shoulder extension    Shoulder internal rotation  Shoulder external rotation    Elbow flexion WFL   Elbow extension WFL   Wrist flexion    Wrist extension WFL   Wrist ulnar deviation    Wrist radial deviation    Wrist pronation    Wrist supination    (Blank rows = not tested)  UPPER EXTREMITY MMT:     MMT  Right eval Left Eval 4-/5 overall  Shoulder flexion 3/5   Shoulder abduction 2/5   Shoulder adduction    Shoulder extension    Shoulder internal rotation    Shoulder external rotation    Middle trapezius    Lower trapezius    Elbow flexion 3/5   Elbow extension 3/5   Wrist flexion    Wrist extension 3/5   Wrist ulnar deviation    Wrist radial deviation    Wrist pronation    Wrist supination    (Blank rows = not tested)  HAND FUNCTION:  Grip strength: Right: 50 lbs; Left: 34 lbs, Lateral pinch: Right: 17 lbs, Left: 9 lbs, 3 point pinch: Right: 12 lbs, Left: 11 lbs  COORDINATION:  9 Hole Peg test: Right: 1 min. & 46 sec; Left: 53 sec  SENSATION:  Light touch: WFL Proprioception: WFL  EDEMA: N/A  COGNITION: Overall cognitive status: Within functional limits for tasks assessed  VISION:  Does not wear glasses.  VISION ASSESSMENT: To be further assessed in functional context  PERCEPTION: WFL  PRAXIS: Impaired: Motor planning                                                                                                                         TREATMENT DATE: 11/04/23  Moist heat modality to the right shoulder to inhibit tone 2/2 tightness.    Therapeutic Ex:   -A/AAROM for the RUE through all joint ranges of motion. Following manual therapy. Barbaraann Boys for the RUE at the tabletop -Progressive gross grip strengthening with 23.4# of grip strength resistive force.     Manual Therapy:   -Soft tissue massage was performed to the scapular, and UE musculature 2/2 tightness -Pt. tolerated scapular mobilization for elevation, depression, and abduction/rotation in sitting to normalize tone, decrease tightness, and prepare for ROM.   -Manual therapy was performed independent of, and in preparation for therapeutic Ex.     Therapeutic Activities:   -bilateral Lateral, and 3pt. Pinch strengthening using yellow, red, and green resistive clips -Facilitated translatory movements  moving clips from the lateral pinch position to the 3pt. Pinch position in preparation for securely placing them on the dowel.    PATIENT EDUCATION: Education details: UE ROM, ADL functional status Person educated: Patient Education method: Explanation, Demonstration, Tactile cues, and Verbal cues Education comprehension: needs further education  HOME EXERCISE PROGRAM:  Continue to assess, and provide as indicated.   GOALS: Goals reviewed with patient? Yes  SHORT TERM GOALS: Target date: 11/09/2023    Pt. Will be independent with HEPs for RUE. Baseline: Eval: No current HEP Goal status:  INITIAL  LONG TERM GOALS: Target date: 12/21/2023  1.  Pt. Will increase right shoulder abduction by 10 degrees to assist with UE dressing Baseline: Eval: Right 52(100) Goal status: INITIAL  2.  Pt. Will increase BUE strength by 2 mm grades to assist with ADLs. Baseline: Eval: Right: shoulder flexion: 3/5, abduction: 2/5, elbow flexion/extension: 3/5, wrist extension: 3/5. Left: 4-/5 overall Goal status: INITIAL  3.  Pt. Will perform self-feeding with modified independence Baseline: Eval: Difficulty using dominant right hand. Drops food when scooping, Both hands for stabilizing drinks, and uses a straw. Unable to cut food. Goal status: INITIAL  4.  Pt. Will demonstrate independence with proper A/E techniques/compensatory strategies for ADL/IADLs.  Baseline: Eval: Education to be provided Goal status: INITIAL  5.  Pt. Will write a sentence with 75% legibility with modified independence Baseline: Eval: 50% legibility for name only. Goal status: INITIAL   ASSESSMENT:  CLINICAL IMPRESSION:  Pt. continues to tolerate the manual therapy, stretches, and grip strengthening exercises well. Pt. presents with improving pain today at 3/10 in the neck, and right shoulder. Pt. presents with less tightness through the scapular region, however continues to present with increased tone and tightness  through the right scapular region. Pt. was able to tolerate increased grip strength. Pt. continues to benefit from OT services to work on improving bilateral UE functioning in order to maximize engagement in, and efficiency in ADLs, and IADL tasks, and provide education about compensatory strategies.     PERFORMANCE DEFICITS: in functional skills including ADLs, IADLs, coordination, dexterity, proprioception, sensation, ROM, strength, pain, Fine motor control, Gross motor control, and UE functional use, cognitive skills including , and psychosocial skills including coping strategies, environmental adaptation, and routines and behaviors.   IMPAIRMENTS: are limiting patient from ADLs, IADLs, and leisure.   CO-MORBIDITIES: may have co-morbidities  that affects occupational performance. Patient will benefit from skilled OT to address above impairments and improve overall function.  MODIFICATION OR ASSISTANCE TO COMPLETE EVALUATION: Min-Moderate modification of tasks or assist with assess necessary to complete an evaluation.  OT OCCUPATIONAL PROFILE AND HISTORY: Detailed assessment: Review of records and additional review of physical, cognitive, psychosocial history related to current functional performance.  CLINICAL DECISION MAKING: Moderate - several treatment options, min-mod task modification necessary  REHAB POTENTIAL: Good  EVALUATION COMPLEXITY: Moderate    PLAN:  OT FREQUENCY: 2x/week  OT DURATION: 12 weeks  PLANNED INTERVENTIONS: 97168 OT Re-evaluation, 97535 self care/ADL training, 14782 therapeutic exercise, 97530 therapeutic activity, 97112 neuromuscular re-education, 97140 manual therapy, 97018 paraffin, 95621 moist heat, 97034 contrast bath, 97760 Orthotics management and training, 30865 Splinting (initial encounter), energy conservation, patient/family education, and DME and/or AE instructions  RECOMMENDED OTHER SERVICES:   PT  CONSULTED AND AGREED WITH PLAN OF CARE:  Patient  PLAN FOR NEXT SESSION:   Treatment  Olegario Messier, MS, OTR/L   11/04/2023, 12:00 PM

## 2023-11-09 ENCOUNTER — Ambulatory Visit: Payer: 59 | Admitting: Physical Therapy

## 2023-11-09 ENCOUNTER — Ambulatory Visit: Payer: 59 | Admitting: Occupational Therapy

## 2023-11-09 DIAGNOSIS — M6281 Muscle weakness (generalized): Secondary | ICD-10-CM

## 2023-11-09 DIAGNOSIS — R278 Other lack of coordination: Secondary | ICD-10-CM

## 2023-11-09 DIAGNOSIS — R2681 Unsteadiness on feet: Secondary | ICD-10-CM

## 2023-11-09 DIAGNOSIS — R531 Weakness: Secondary | ICD-10-CM

## 2023-11-09 DIAGNOSIS — R269 Unspecified abnormalities of gait and mobility: Secondary | ICD-10-CM

## 2023-11-09 NOTE — Therapy (Signed)
 OUTPATIENT PHYSICAL THERAPY NEURO TREATMENT Physical Therapy Progress Note   Dates of reporting period  09/28/2023   to   11/09/2023    Patient Name: Daniel Daugherty MRN: [id number] DOB:1966/07/26, 58 y.o., male Today's Date: 11/09/2023   PCP: Leanord Asal, Nelva Bush, MD  REFERRING PROVIDER: Leanord Asal, Nelva Bush, MD   END OF SESSION:   PT End of Session - 11/09/23 0931     Visit Number 10    Number of Visits 24    Date for PT Re-Evaluation 12/21/23    Authorization Type Medicare 2025    PT Start Time 0931    PT Stop Time 1015    PT Time Calculation (min) 44 min    Equipment Utilized During Treatment Gait belt    Activity Tolerance Patient tolerated treatment well    Behavior During Therapy [facility] for tasks assessed/performed                Past Medical History:  Diagnosis Date   Hypertension    Past Surgical History:  Procedure Laterality Date   DG HAND LEFT COMPLETE ([facility] HX) Left 04/13/1999   There are no active problems to display for this patient.   ONSET DATE: 2022 is when he noticed the change and that was around the same time he was diagnosed with Parkinson's  REFERRING DIAG:  G20.A1 (ICD-10-CM) - Parkinson disease (HCC)  Z74.1 (ICD-10-CM) - Requires daily assistance for activities of daily living (ADL) and comfort needs   THERAPY DIAG:  Muscle weakness (generalized)  Other lack of coordination  Abnormality of gait  Unsteadiness on feet  Generalized weakness  Rationale for Evaluation and Treatment: Rehabilitation  SUBJECTIVE:                                                                                                                                                                                             SUBJECTIVE STATEMENT:   Pt reports he has been doing alright. Denies any falls. States he is doing his HEP "a little bit."  Pt accompanied by: self  PERTINENT HISTORY: Parkinsonism, Vitamin D deficiency, Hyperlipidemia, HTN, poor  medication compliance (per chart review)  PAIN:  Are you having pain? Yes: NPRS scale: 5/10, but states "it ain't that bad" Pain location: R shoulder and low back Pain description: throbbing in shoulder Aggravating factors: overhead movements or reaching back Relieving factors: rest and at home uses a "massager"  PRECAUTIONS: Fall  RED FLAGS: None   WEIGHT BEARING RESTRICTIONS: No  FALLS: Has patient fallen in last 6 months? Yes. 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   PATIENT GOALS: Get my balance right with my walking, help get my R arm stronger and be able to move it more, improve strength in my legs (specifically the Right)  OBJECTIVE:  Note: Objective measures were completed at Evaluation unless otherwise noted.  DIAGNOSTIC FINDINGS: N/A  COGNITION: Overall cognitive status: Within functional limits for tasks assessed; however, did not assess higher level cognitive tasks   SENSATION: WFL Light touch on screen Pt reports R foot falls asleep sitting on toilet and he has difficult time getting feeling back in it before feeling safe standing up.  COORDINATION: Overall bradykinesia with decreased speed and amplitude of movements  EDEMA:  None present  MUSCLE TONE: Not formally assessed  MUSCLE LENGTH: Not formally assessed, but may have some hamstring tightness on R associated with quad  weakness and inability to achieve full knee extension in sitting  DTRs:  Not assessed  POSTURE: rounded shoulders, forward head, increased thoracic kyphosis, posterior pelvic tilt, and flexed trunk    LOWER EXTREMITY ROM:     Active ROM Right Eval Left Eval  Hip flexion Decreased in sitting compared to L   Hip extension    Hip abduction    Hip adduction    Hip internal rotation    Hip external rotation    Knee flexion    Knee extension Lacks full knee extension in sitting   Ankle dorsiflexion Limited in sitting Limited in sitting  Ankle plantarflexion    Ankle inversion    Ankle eversion     (Blank rows = not tested)  LOWER EXTREMITY MMT:    MMT Right Eval Left Eval  Hip flexion 3- 4+  Hip extension    Hip abduction    Hip adduction    Hip internal rotation    Hip external rotation    Knee flexion 3 4+  Knee extension 3- 4+  Ankle dorsiflexion 3- 4+  Ankle plantarflexion 3- 4+  Ankle inversion    Ankle eversion    (Blank rows = not tested)  Manual Muscle Test Scale 0/5 = No muscle contraction can be seen or felt 1/5 = Contraction can be felt, but there is no motion 2-/5 = Part moves through incomplete ROM w/ gravity decreased 2/5 = Part moves through complete ROM w/ gravity decreased 2+/5 = Part moves through incomplete ROM (<50%) against gravity or through complete ROM w/ gravity 3-/5 = Part moves through incomplete ROM (>50%) against gravity 3/5 = Part moves through complete ROM against gravity 3+/5 = Part moves through complete ROM against gravity/slight resistance 4-/5= Holds test position against slight to moderate pressure 4/5 = Part moves through complete ROM against gravity/moderate resistance 4+/5= Holds test position against moderate to strong pressure 5/5 = Part moves through complete ROM against gravity/full resistance  BED MOBILITY:  Sit to supine   Supine to sit   Need to assess  ADL: Reports dificulty getitng on R shoe, specifically  having trouble pushing his foot down in his  shoe  TRANSFERS: Assistive device utilized:  Hurry cane   Sit to stand: Modified independence and SBA Stand to sit: Modified independence and SBA Chair to chair: Modified independence and SBA Floor:  would benefit from testing in future  RAMP:  Level of Assistance:    Assistive device utilized:  Hurry cane Ramp Comments: would benefit from assessing  CURB:  Level of Assistance:    Assistive device utilized:  [employer] Comments: would benefit from assessing  STAIRS: Level of Assistance: CGA and Min A Stair Negotiation Technique: Step to Pattern Forwards with Bilateral Rails Number of Stairs: 4  Height of Stairs: 6in  Comments: leading with R LE in both directions, attempts reciprocal pattern on ascent with sufficient strength, but having uncontrolled anterior lean during descent  GAIT: Gait pattern: step through pattern, decreased arm swing- Right, decreased arm swing- Left, decreased step length- Right, decreased step length- Left, decreased stride length, decreased hip/knee flexion- Right, decreased hip/knee flexion- Left, decreased ankle dorsiflexion- Right, decreased ankle dorsiflexion- Left, decreased trunk rotation, trunk flexed, poor foot clearance- Right, and poor foot clearance- Left Distance walked: 195ft Assistive device utilized: None Level of assistance: CGA Comments: significantly decreased  FUNCTIONAL TESTS:  5 times sit to stand: 38.53 seconds arms across chest Timed up and go (TUG): 19.46 seconds without AD 6 minute walk test: 10/04/2023: 553ft (129m at 0.54m/s avg) 10 meter walk test: 0.56 m/s without AD Mini-Best: 10/04/2023: 8/28  PATIENT SURVEYS:  ABC scale 17.5%  with pt feeling <40% confident on all of the items, pt feels only 30% confident walking around the house, 10% confident getting in/out of car, and 20% reaching for items                                                                                                                                TREATMENT DATE: 11/09/23   Unless otherwise stated, CGA/min A was provided and gait belt donned in order to ensure pt safety throughout.  Therapy session focused on re-assessment of standardized outcome measures and subjective questionnaire to determine pt's progress with therapy thus far.  Participated in Timed Up and Go (TUG): 1st trial: 17.44 seconds no AD with close SBA for safety 2nd trial: 16.49 seconds no AD with close SBA for safety Average: 16.96 seconds, no AD, with close SBA for balance safety Patient demonstrates high fall risk as indicated by requiring >13.5seconds to complete the TUG.    Five times Sit to Stand Test (FTSS) "Stand up and sit down as quickly as possible 5 times, keeping your arms folded across your chest."    TIME: 22.13 seconds with arms across chest  Times > 13.6 seconds is associated with increased disability and morbidity (Guralnik, 2000) Times > 15 seconds is predictive of recurrent falls in healthy individuals aged 47 and older (Buatois, et al., 2008) Normal performance values in community dwelling individuals aged 54 and older (Bohannon, 2006): 60-69 years: 11.4 seconds  70-79 years: 12.6 seconds 80-89 years: 14.8 seconds  MCID: >= 2.3 seconds for Vestibular Disorders (Meretta, 2006)  Pt reports getting in/out of the car is getting better, but still has difficult time with heavier car doors, reporting a difficult time pushing the door open with his R arm.  10 Meter Walk Test: Patient instructed to walk 10 meters (32.8 ft) as quickly and as safely as possible at their normal speed Results: 0.89 m/s (11.50 seconds seconds 10.94 seconds) no AD, with close SBA for safety  Cut off scores:   Household Ambulator  < 0.4 m/s  Limited Community Ambulator  0.4 - 0.8 m/s  [employer]  > 0.8 m/s  Increased fall risk  < 1.65m/s  Crossing a Street  >1.88m/s  MCID 0.05 m/s (small), 0.13 m/s  (moderate), 0.06 m/s (significant)  (ANPTA Core Set of Outcome Measures for Adults with Neurologic Conditions, 2018)    6 Min Walk Test:  Instructed patient to ambulate as quickly and as safely as possible for 6 minutes using LRAD. Patient was allowed to take standing rest breaks without stopping the test, but if the patient required a sitting rest break the clock would be stopped and the test would be over.  Results: 1061 feet (323 meters, Avg speed 0.897 m/s) no AD, no standing breaks needed this time, and with only close SBA and a few instances of CGA for safety as pt starts to have progressive worsening of anterior lean with fatigue - verbal cuing provided throughout to improve upright posture and decrease shuffled gait pattern. Results indicate that the patient has reduced endurance with ambulation compared to age matched norms.  Age Matched Norms: 70-69 yo M: 85 F: 52, 73-79 yo M: 48 F: 471, 4-57 yo M: 417 F: 392 MDC: 58.21 meters (190.98 feet) or 50 meters (ANPTA Core Set of Outcome Measures for Adults with Neurologic Conditions, 2018)  Activities-specific Balance Confidence Scale:  Score: 21.25% - pt reports he rated the test as is if he was not using his walker because this is his long term goal  Increased risk of falls in community-dwelling, older adults <80% (79.89%)  0% = no confidence - 100% = complete confidence (ANPTA Core Set of Outcome Measures for Adults with Neurologic Conditions, 2018)  Pt states he can tell "a lot" of difference in his mobility (positively) since starting therapy.   Mini-BESTest: Balance Evaluation Systems Test SCORE __/28 - will need to complete at next visit (A score <63% equivalent to <18/28 indicates increased fall risk.)    [facility] PT Assessment - 11/09/23 0001       Mini-BESTest   Sit To Stand Normal: Comes to stand without use of hands and stabilizes independently.    Rise to Toes Normal: Stable for 3 s with maximum height.    Stand on one  leg (left) Normal: 20 s.   30 sec   Stand on one leg (right) Moderate: < 20 s   LOB at 4 sec   Stand on one leg - lowest score 1                PATIENT EDUCATION: Education details: PT POC, Goals, results of outcome measures indicating increased fall risk Person educated: Patient Education method: Explanation Education comprehension: verbalized understanding and needs further education  HOME EXERCISE PROGRAM: Access Code: [email or web address] URL: [email or web address] Date: 10/07/2023 Prepared by: Casimiro Needle  Exercises - Seated Forward Bending  - 1 x daily - 7 x weekly - 2 sets -  10 reps - Seated Reaching to Side and Across Body  - 1 x daily - 7 x weekly - 2 sets - 10 reps - Seated Side Bend Elbow on Knee with Opposite Arm Reach Overhead -  1 x daily - 7 x weekly - 2 sets - 10 reps  GOALS: Goals reviewed with patient? Yes  SHORT TERM GOALS: Target date: 11/09/2023    Patient will be independent in home exercise program to improve strength/mobility for better functional independence with ADLs. Baseline: need to initiate 10/07/2023: provided Goal status: IN PROGRESS   LONG TERM GOALS: Target date: 12/21/2023  1.  Patient (< 32 years old) will complete five times sit to stand test in < 12 seconds indicating an increased LE strength and improved balance. Baseline: 09/28/23: 38.53 seconds 11/09/2023: 22.13 seconds with arms across chest Goal status: IN PROGRESS   2. Patient will reduce timed up and go to <11 seconds to reduce fall risk and demonstrate improved transfer/gait ability. Baseline: 09/28/23: 19.46 seconds without AD 11/09/2023: 16.96 seconds, no AD, with close SBA for balance safety Goal status: IN PROGRESS  3.  Patient will increase 10 meter walk test to >1.45m/s as to improve gait speed for better community ambulation and to reduce fall risk. Baseline: 09/28/23: 0.77m/s without AD 11/09/2023: 0.89 m/s (11.50 seconds seconds 10.94 seconds) no AD, with close SBA  for safety Goal status: IN PROGRESS  4.  Patient will increase six minute walk test distance to >1021ft for progression to community ambulator and improve gait ability Baseline: 10/04/2023: 510ft (175m at 0.37m/s avg) 11/09/2023: 1061 feet (323 meters, Avg speed 0.897 m/s) without AD Goal status: MET and Advanced 11/09/2023 Goal Updated: distance > 1346ft  5. Patient will increase MiniBest Test score to >18/28 to indicate a reduced risk for falling and demonstrate increased independence with functional mobility and ADLs.  Baseline: 10/04/2023: 8/28  11/09/2023: need to assess  Goal status: IN PROGRESS  6. Patient will increase ABC scale score >80% to demonstrate better functional mobility and better confidence with ADLs.   Baseline: 09/28/23: 17.5% 11/09/2023: 21.25% - pt reports he rated the test as is if he was not using his walker because this is his long term goal   Goal status: IN PROGRESS   ASSESSMENT:  CLINICAL IMPRESSION:   Patient arrived motivated and excited to participate in therapy session. Therapy session focused on re-assessment of standardized outcome measures and subjective questionnaire to determine pt's progress with therapy thus far. Patient demonstrates improvement on all outcome measures; however, remains in the high fall risk category. Pt's assessments indicate he is improving his functional B LE strength on 5xSTS, increasing his transfers and gait speed on TUG as well as walk test and pt with significant improvement on his 6 min walk test indicating overall increased gait endurance. Pt did indicate increasing confidence in his balance; however, it remains severely low at 21.25% indicating pt feels very imbalanced when performing daily mobility tasks. Patient will benefit from further skilled PT to improve these deficits in order to increase QOL and ease/safety with ADLs and functional mobility as well as decrease risk for falls. Patient's condition has the potential to  improve in response to therapy. Maximum improvement is yet to be obtained. The anticipated improvement is attainable and reasonable in a generally predictable time.    OBJECTIVE IMPAIRMENTS: Abnormal gait, decreased activity tolerance, decreased balance, decreased coordination, decreased endurance, decreased knowledge of condition, decreased knowledge of use of DME, decreased mobility, difficulty walking, decreased  ROM, decreased strength, decreased safety awareness, improper body mechanics, postural dysfunction, and pain.   ACTIVITY LIMITATIONS: carrying, lifting, bending, standing, squatting, sleeping, stairs, transfers, bed mobility, continence, bathing, toileting, dressing, reach over head, hygiene/grooming, and locomotion level  PARTICIPATION LIMITATIONS: meal prep, cleaning, laundry, and community activity  PERSONAL FACTORS: Fitness and 3+ comorbidities: Vitamin D deficiency, Hyperlipidemia, and HTN  are also affecting patient's functional outcome.   REHAB POTENTIAL: Good  CLINICAL DECISION MAKING: Evolving/moderate complexity  EVALUATION COMPLEXITY: Moderate  PLAN:  PT FREQUENCY: 1-2x/week  PT DURATION: 12 weeks  PLANNED INTERVENTIONS: 97164- PT Re-evaluation, 97110-Therapeutic exercises, 97530- Therapeutic activity, 97112- Neuromuscular re-education, 97535- Self Care, 16109- Manual therapy, [phone]- Gait training, [phone]- Orthotic Fit/training, [phone]- Canalith repositioning, [phone]- Electrical stimulation (manual), Patient/Family education, Balance training, Stair training, Joint mobilization, Vestibular training, Visual/preceptual remediation/compensation, DME instructions, Cryotherapy, and Moist heat  PLAN FOR NEXT SESSION:  - MiniBest Test - review and progress HEP  - assess bed mobility - gait training with increased intensity - dynamic gait with focus on increased upright posture and speed of ambulation with arm swing (add AWs to UEs?)  - stepping in different directions   -  with dual-task of Blaze Pods to promote increased speed/amplitude of movements  - can step over hurdle next  - continue arm reaching and trunk rotation (specifically to R) - large amplitude UE movements with promotion of improved upright posture - B LE strengthening (R>L)  - standing balance: narrow BOS/SLS, on compliant surfaces, eyes open&eyes closed - progress to floor transfers    Casimiro Needle, PT, DPT, NCS, CSRS Physical Therapist - [facility]  [facility]  10:19 AM 11/09/23

## 2023-11-09 NOTE — Therapy (Addendum)
 Occupational Therapy Progress Note  Dates of reporting period  09/28/23   to   11/09/23   Patient Name: Daniel Daugherty MRN: [id number] DOB:11-08-1965, 58 y.o., male Today's Date: 11/09/2023  PCP: N/A REFERRING PROVIDER: Norval Morton, MD  END OF SESSION:  OT End of Session - 11/09/23 1018     Visit Number 10    Number of Visits 24    Date for OT Re-Evaluation 12/21/23    OT Start Time 1015    OT Stop Time 1100    OT Time Calculation (min) 45 min    Activity Tolerance Patient tolerated treatment well    Behavior During Therapy WFL for tasks assessed/performed                 Past Medical History:  Diagnosis Date   Hypertension    Past Surgical History:  Procedure Laterality Date   DG HAND LEFT COMPLETE ([facility] HX) Left 04/13/1999   There are no active problems to display for this patient.   ONSET DATE: 05/2021  REFERRING DIAG: Parkinson's Disease  THERAPY DIAG:  Muscle weakness (generalized)  Other lack of coordination  Rationale for Evaluation and Treatment: Rehabilitation  SUBJECTIVE:   SUBJECTIVE STATEMENT: Pt. Feeling good today. Pt accompanied by: self  PERTINENT HISTORY:   Pt. is a 58 y.o. male who was diagnosed with Parkinson's Disease in 2022. Pt. reports having had a progressive decline in function since having hernia surgery, and right shoulder rotator cuff surgery. PMHx included: HTN. Of note; Pt. was being followed by [facility], and receiving Home health therapy services. Pt. changed physicians, and was referred for outpatient rehab services here.  PRECAUTIONS: None  WEIGHT BEARING RESTRICTIONS: No  PAIN:  Are you having pain? Intermittent 3/10 neck and right shoulder pain.  FALLS: Has patient fallen in last 6 months? No, 1 small slip near the bed tripped over shoes  LIVING ENVIRONMENT: Lives with: lives alone Lives in: [place],  Stairs: Entry level Has following equipment at home: Quad cane small base, rollator, shower chair, grab  bars, hand rail at [employer]  PLOF: Independent  PATIENT GOALS: Improve self-care  OBJECTIVE:  Note: Objective measures were completed at Evaluation unless otherwise noted.  HAND DOMINANCE: Right  ADLs:  Assist at home:  Ex wife, and children assist several times a week  Transfers/ambulation related to ADLs: Modified Independent Eating: Difficulty using dominant right hand. Drops food when scooping, Both hands for stabilizing drinks, and uses a straw. Unable to cut food.  Grooming: Increased time required for all grooming tasks. UB Dressing: Difficulty managing buttons LB Dressing: Less assist with pants with elastic, more assist with dress pants Toileting: Increased time for hygiene. Bathing: Requires increased time to complete Tub Shower transfers: Modified Independence with increased time  IADLs: Shopping: Son assists with grocery shopping Light housekeeping: Son's assist. Pt. Does light rising off of dishes. Meal Prep: Increased time. Independent with microwave, and air fryer. Difficulty opening bottles, containers Community mobility:  Transportation, light driving to grocery store Medication management:  Son's set-up weekly pillbox, Pt. Is responsible for taking the medications Financial management: Son's assist with monthly finances Handwriting: 50% legible Hobbies: Dancing, watching sports Career/work: Restaurant manager, fast food  MOBILITY STATUS: Uses a cane; shuffling gait  FUNCTIONAL OUTCOME MEASURES:  TBD  UPPER EXTREMITY ROM:    Active ROM Right eval Right 11/09/23 Left Eval [facility] overall  Shoulder flexion WFL 120(134)   Shoulder abduction 52(100) 60(110)   Shoulder adduction     Shoulder  extension     Shoulder internal rotation     Shoulder external rotation     Elbow flexion [facility] WFL   Elbow extension [facility]   Wrist flexion     Wrist extension [facility]   Wrist ulnar deviation     Wrist radial deviation     Wrist pronation     Wrist supination     (Blank  rows = not tested)  UPPER EXTREMITY MMT:     MMT Right eval Right 11/09/23 Left Eval 4-/5 overall Left 11/09/23 4/5 Overall  Shoulder flexion 3/5 3-/5    Shoulder abduction 2/5 2/5    Shoulder adduction      Shoulder extension      Shoulder internal rotation      Shoulder external rotation      Middle trapezius      Lower trapezius      Elbow flexion 3/5 3+/5    Elbow extension 3/5 3+5    Wrist flexion      Wrist extension 3/5 3/5    Wrist ulnar deviation      Wrist radial deviation      Wrist pronation      Wrist supination      (Blank rows = not tested)  HAND FUNCTION:   Eval: Grip strength: Right: 50 lbs; Left: 34 lbs, Lateral pinch: Right: 17 lbs, Left: 9 lbs, 3 point pinch: Right: 12 lbs, Left: 11 lbs  11/09/23: Grip strength: Right: 60 lbs; Left: 35 lbs, Lateral pinch: Right: 19 lbs, Left: 11 lbs, 3 point pinch: Right: 13 lbs, Left: 9 lbs  COORDINATION:  Eval: 9 Hole Peg test: Right: 1 min. & 46 sec; Left: 53 sec  11/09/2023: 9 Hole Peg test: Right: 1 min. & 22 sec; Left: 46 sec   SENSATION:  Light touch: WFL Proprioception: WFL  EDEMA: N/A  COGNITION: Overall cognitive status: Within functional limits for tasks assessed  VISION:  Does not wear glasses.  VISION ASSESSMENT: To be further assessed in functional context  PERCEPTION: WFL  PRAXIS: Impaired: Motor planning                                                                                                                         TREATMENT DATE: 11/09/23  Measurements were obtained, and goals were reviewed with the Pt.    PATIENT EDUCATION: Education details: UE ROM, ADL functional status Person educated: Patient Education method: Explanation, Demonstration, Tactile cues, and Verbal cues Education comprehension: needs further education  HOME EXERCISE PROGRAM:  Continue to assess, and provide as indicated.   GOALS: Goals reviewed with patient? Yes  SHORT TERM GOALS: Target date:  11/09/2023    Pt. Will be independent with HEPs for RUE. Baseline: 11/09/2023: Independent Eval: No current HEP Goal status:Ongoing  LONG TERM GOALS: Target date: 12/21/2023  1.  Pt. Will increase right shoulder abduction by 10 degrees to assist with UE dressing Baseline: 11/09/23: Right 60/(110) Eval: Right 52(100) Goal status:  Ongoing  2.  Pt. Will increase BUE strength by 2 mm grades to assist with ADLs. Baseline: 11/09/23: Right: shoulder flexion: 3-/5, abduction: 2/5, elbow flexion/extension: 3+/5, wrist extension: 3/5. Left: 4/5 overallEval: Right: shoulder flexion: 3/5, abduction: 2/5, elbow flexion/extension: 3/5, wrist extension: 3/5. Left: 4-/5 overall Goal status: Ongoing  3.  Pt. Will perform self-feeding with modified independence Baseline: 11/09/23: Pt. Continues to have difficulty using dominant right hand. Drops food when scooping, Both hands for stabilizing drinks, and uses a straw. Unable to cut food. Eval: Difficulty using dominant right hand. Drops food when scooping, Both hands for stabilizing drinks, and uses a straw. Unable to cut food. Goal status: Ongoing  4.  Pt. Will demonstrate independence with proper A/E techniques/compensatory strategies for ADL/IADLs.  Baseline: 11/09/23: Continue Eval: Education to be provided Goal status: Ongoing  5.  Pt. Will write a sentence with 75% legibility with modified independence Baseline: 11/09/2023: 50% legibility for one sentence. Eval: 50% legibility for one printed sentence Goal status: Ongoing    ASSESSMENT:  CLINICAL IMPRESSION:  Pt.  Is making steady progress. Pt. has made improvements with grip bilaterla grip strength, pinch strength, and FMC skills. Pt.'s pain in improving overall. Pt. is now engaging his bilateral hands during more tasks at home, and is now able to reach for his charger more efficiently, and is improving with reaching to his LEs. Pt. continues to present with limited UE strength, and coordination skills  which limit his ability to perform self-feeding, and grooming skills. Pt. continues to benefit from OT services to work on improving overall bilateral UE functioning in order to maximize engagement in, and efficiency in ADLs, and IADL tasks, and provide education about compensatory strategies.     PERFORMANCE DEFICITS: in functional skills including ADLs, IADLs, coordination, dexterity, proprioception, sensation, ROM, strength, pain, Fine motor control, Gross motor control, and UE functional use, cognitive skills including , and psychosocial skills including coping strategies, environmental adaptation, and routines and behaviors.   IMPAIRMENTS: are limiting patient from ADLs, IADLs, and leisure.   CO-MORBIDITIES: may have co-morbidities  that affects occupational performance. Patient will benefit from skilled OT to address above impairments and improve overall function.  MODIFICATION OR ASSISTANCE TO COMPLETE EVALUATION: Min-Moderate modification of tasks or assist with assess necessary to complete an evaluation.  OT OCCUPATIONAL PROFILE AND HISTORY: Detailed assessment: Review of records and additional review of physical, cognitive, psychosocial history related to current functional performance.  CLINICAL DECISION MAKING: Moderate - several treatment options, min-mod task modification necessary  REHAB POTENTIAL: Good  EVALUATION COMPLEXITY: Moderate    PLAN:  OT FREQUENCY: 2x/week  OT DURATION: 12 weeks  PLANNED INTERVENTIONS: 97168 OT Re-evaluation, 97535 self care/ADL training, 16109 therapeutic exercise, 97530 therapeutic activity, 97112 neuromuscular re-education, 97140 manual therapy, 97018 paraffin, 60454 moist heat, 97034 contrast bath, 97760 Orthotics management and training, 09811 Splinting (initial encounter), energy conservation, patient/family education, and DME and/or AE instructions  RECOMMENDED OTHER SERVICES:   PT  CONSULTED AND AGREED WITH PLAN OF CARE:  Patient  PLAN FOR NEXT SESSION:   Treatment  Olegario Messier, MS, OTR/L   11/09/2023, 10:19 AM

## 2023-11-11 ENCOUNTER — Ambulatory Visit: Payer: 59 | Admitting: Occupational Therapy

## 2023-11-11 ENCOUNTER — Ambulatory Visit: Payer: 59 | Admitting: Physical Therapy

## 2023-11-16 ENCOUNTER — Ambulatory Visit: Payer: 59 | Admitting: Physical Therapy

## 2023-11-16 ENCOUNTER — Ambulatory Visit: Payer: 59 | Admitting: Occupational Therapy

## 2023-11-16 DIAGNOSIS — M6281 Muscle weakness (generalized): Secondary | ICD-10-CM

## 2023-11-16 DIAGNOSIS — R278 Other lack of coordination: Secondary | ICD-10-CM | POA: Diagnosis not present

## 2023-11-16 DIAGNOSIS — R531 Weakness: Secondary | ICD-10-CM

## 2023-11-16 DIAGNOSIS — R269 Unspecified abnormalities of gait and mobility: Secondary | ICD-10-CM

## 2023-11-16 DIAGNOSIS — R2681 Unsteadiness on feet: Secondary | ICD-10-CM

## 2023-11-16 NOTE — Therapy (Signed)
 OUTPATIENT PHYSICAL THERAPY NEURO TREATMENT   Patient Name: FRANCOIS ELK MRN: [id number] DOB:[birth year], 58 y.o., male Today's Date: 11/16/2023   PCP: Tawnya Fava, Waddell Guess, MD  REFERRING PROVIDER: Tawnya Fava, Waddell Guess, MD   END OF SESSION:   PT End of Session - 11/16/23 [phone]     Visit Number 11    Number of Visits 24    Date for PT Re-Evaluation 12/21/23    Authorization Type Medicare 2025    PT Start Time 0935    PT Stop Time 1016    PT Time Calculation (min) 41 min    Equipment Utilized During Treatment Gait belt    Activity Tolerance Patient tolerated treatment well    Behavior During Therapy WFL for tasks assessed/performed              Past Medical History:  Diagnosis Date   Hypertension    Past Surgical History:  Procedure Laterality Date   DG HAND LEFT COMPLETE ([facility] HX) Left 04/13/1999   There are no active problems to display for this patient.   ONSET DATE: 2022 is when he noticed the change and that was around the same time he was diagnosed with Parkinson's  REFERRING DIAG:  G20.A1 (ICD-10-CM) - Parkinson disease (HCC)  Z74.1 (ICD-10-CM) - Requires daily assistance for activities of daily living (ADL) and comfort needs   THERAPY DIAG:  Muscle weakness (generalized)  Other lack of coordination  Abnormality of gait  Generalized weakness  Unsteadiness on feet  Rationale for Evaluation and Treatment: Rehabilitation  SUBJECTIVE:                                                                                                                                                                                             SUBJECTIVE STATEMENT:   Pt reports he has been doing alright, states today is his son's 36th birthday. Pt reports he fell this weekend when he was opening his freezer door causing him to turn quickly, bump his head on the wall and then have posterior LOB falling back on his trash can. Denies LOC, reports he was really sore for a  few days and had a scratch on his arm, L pinky finger was sore, and had a bump on his forehead (not visible today), but denies other injuries. States he was able to get up from the fall modified independently.  States he is doing his HEP "a little bit."  Pt accompanied by: self  PERTINENT HISTORY: Parkinsonism, Vitamin D deficiency, Hyperlipidemia, HTN, poor medication compliance (per chart review)  PAIN:  Are you having pain? Yes: NPRS scale: 5/10, but  states "it ain't that bad" Pain location: R shoulder and low back Pain description: throbbing in shoulder Aggravating factors: overhead movements or reaching back Relieving factors: rest and at home uses a "massager"  PRECAUTIONS: Fall  RED FLAGS: None   WEIGHT BEARING RESTRICTIONS: No  FALLS: Has patient fallen in last 6 months? Yes. 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   PATIENT GOALS: Get my balance right with my walking, help get my R arm stronger and be able to move it more, improve strength in my legs (specifically the Right)  OBJECTIVE:  Note: Objective measures were completed at Evaluation unless otherwise noted.  DIAGNOSTIC FINDINGS: N/A  COGNITION: Overall cognitive status: Within functional limits for tasks assessed; however, did not assess higher level cognitive tasks   SENSATION: WFL Light touch on screen Pt reports R foot falls  asleep sitting on toilet and he has difficult time getting feeling back in it before feeling safe standing up.  COORDINATION: Overall bradykinesia with decreased speed and amplitude of movements  EDEMA:  None present  MUSCLE TONE: Not formally assessed  MUSCLE LENGTH: Not formally assessed, but may have some hamstring tightness on R associated with quad weakness and inability to achieve full knee extension in sitting  DTRs:  Not assessed  POSTURE: rounded shoulders, forward head, increased thoracic kyphosis, posterior pelvic tilt, and flexed trunk    LOWER EXTREMITY ROM:     Active ROM Right Eval Left Eval  Hip flexion Decreased in sitting compared to L   Hip extension    Hip abduction    Hip adduction    Hip internal rotation    Hip external rotation    Knee flexion    Knee extension Lacks full knee extension in sitting   Ankle dorsiflexion Limited in sitting Limited in sitting  Ankle plantarflexion    Ankle inversion    Ankle eversion     (Blank rows = not tested)  LOWER EXTREMITY MMT:    MMT Right Eval Left Eval  Hip flexion 3- 4+  Hip extension    Hip abduction    Hip adduction    Hip internal rotation    Hip external rotation    Knee flexion 3 4+  Knee extension 3- 4+  Ankle dorsiflexion 3- 4+  Ankle plantarflexion 3- 4+  Ankle inversion    Ankle eversion    (Blank rows = not tested)  Manual Muscle Test Scale 0/5 = No muscle contraction can be seen or felt 1/5 = Contraction can be felt, but there is no motion 2-/5 = Part moves through incomplete ROM w/ gravity decreased 2/5 = Part moves through complete ROM w/ gravity decreased 2+/5 = Part moves through incomplete ROM (<50%) against gravity or through complete ROM w/ gravity 3-/5 = Part moves through incomplete ROM (>50%) against gravity 3/5 = Part moves through complete ROM against gravity 3+/5 = Part moves through complete ROM against gravity/slight resistance 4-/5= Holds test position against  slight to moderate pressure 4/5 = Part moves through complete ROM against gravity/moderate resistance 4+/5= Holds test position against moderate to strong pressure 5/5 = Part moves through complete ROM against gravity/full resistance  BED MOBILITY:  Sit to supine   Supine to sit   Need to assess  ADL: Reports dificulty getitng on R shoe, specifically having trouble pushing his foot down in his shoe  TRANSFERS: Assistive device utilized:  Hurry cane   Sit to stand: Modified independence and SBA Stand to sit: Modified independence and SBA Chair to chair: Modified independence and SBA Floor:  would benefit from testing in future  RAMP:  Level of Assistance:    Assistive device utilized:  Hurry cane Ramp Comments: would benefit from assessing  CURB:  Level of Assistance:    Assistive device utilized:  [employer] Comments: would benefit from assessing  STAIRS: Level of Assistance: CGA and Min A Stair Negotiation Technique: Step to Pattern Forwards with Bilateral Rails Number of Stairs: 4  Height of Stairs: 6in  Comments: leading with R LE in both directions, attempts reciprocal pattern on ascent with sufficient strength, but having uncontrolled anterior lean during descent  GAIT: Gait pattern: step through pattern, decreased arm swing- Right, decreased arm swing- Left, decreased step length- Right, decreased step length- Left, decreased stride length, decreased hip/knee flexion- Right, decreased hip/knee flexion- Left, decreased ankle dorsiflexion- Right, decreased ankle dorsiflexion- Left, decreased trunk rotation, trunk flexed, poor foot clearance- Right, and poor foot clearance- Left Distance walked: 115ft Assistive device utilized: None Level of assistance: CGA Comments: significantly decreased  FUNCTIONAL TESTS:  5 times sit to stand: 38.53 seconds arms across chest Timed up and go (TUG): 19.46 seconds without AD 6 minute walk test: 10/04/2023: 538ft (159m at 0.70m/s  avg) 10 meter walk test: 0.56 m/s without AD Mini-Best: 10/04/2023: 8/28  PATIENT SURVEYS:  ABC scale 17.5%  with pt feeling <40% confident on all of the items, pt feels only 30% confident walking around the house, 10% confident getting in/out of car, and 20% reaching for items                                                                                                                               TREATMENT DATE: 11/16/23  Unless otherwise stated, CGA/min A was provided and gait belt donned in order to ensure pt safety throughout.  Gait training ~359ft, no AD, for dynamic warm up with focus on increased gait speed, longer step lengths, and increased arm swing - pt continues to have thoracic rounding with increased anterior trunk lean, decreased step lengths landing on his toes, and lack of trunk rotation and arm swing.   Mini-BESTest: Balance Evaluation Systems Test SCORE 16/28 (A score <63% equivalent to <18/28 indicates increased fall risk.).   [facility] PT Assessment - 11/16/23 0001       Mini-BESTest   Sit To Stand Normal: Comes to stand without use of hands and stabilizes  independently.    Rise to Toes Normal: Stable for 3 s with maximum height.    Stand on one leg (left) Normal: 20 s.   requires 2nd attempt, initially achieves 15seconds   Stand on one leg (right) Moderate: < 20 s   only able to maintain 5 seconds before LOB   Stand on one leg - lowest score 1    Compensatory Stepping Correction - Forward Normal: Recovers independently with a single, large step (second realignement is allowed).    Compensatory Stepping Correction - Backward No step, OR would fall if not caught, OR falls spontaneously.    Compensatory Stepping Correction - Left Lateral Moderate: Several steps to recover equilibrium    Compensatory Stepping Correction - Right Lateral Moderate: Several steps to recover equilibrium    Stepping Corredtion Lateral - lowest score 1    Stance - Feet together, eyes open,  firm surface  Normal: 30s    Stance - Feet together, eyes closed, foam surface  Moderate: < 30s   L posterior LOB after 15 seconds, pt able to use stepping strategy and light min A to regain balance   Incline - Eyes Closed Severe: Unable   consistent posterior LOB   Change in Gait Speed Moderate: Unable to change walking speed or signs of imbalance    Walk with head turns - Horizontal Moderate: performs head turns with reduction in gait speed.    Walk with pivot turns Moderate:Turns with feet close SLOW (>4 steps) with good balance.    Step over obstacles Moderate: Steps over box but touches box OR displays cautious behavior by slowing gait.    Timed UP & GO with Dual Task Moderate: Dual Task affects either counting OR walking (>10%) when compared to the TUG without Dual Task.   TUG 14.75seconds, TUG cognitivive: 18.93 seconds   Mini-BEST total score 16            Educated pt on results of assessment with pt having greatest challenge with the following: all gait tasks causing slowed gait speed, standing on airex, standing on incline, single-leg-stance on R LE, and posterior stepping strategy.    OCULOMOTOR EXAM - performed due to Neurology note reporting concerns of decreased vertical gaze:  Ocular Alignment: normal  Ocular ROM: No Limitations  Spontaneous Nystagmus: absent  Gaze-Induced Nystagmus: absent  Smooth Pursuits: intact, good vertical movements with full range  Horizontal and Vertical Saccades: extra eye movements, slow, and frequently blinks, a few extra eye movements when moving to superior target        PATIENT EDUCATION: Education details: PT POC, Goals, results of outcome measures indicating increased fall risk Person educated: Patient Education method: Explanation Education comprehension: verbalized understanding and needs further education  HOME EXERCISE PROGRAM: Access Code: [email or web address] URL: [email or web address] Date: 10/07/2023 Prepared by: Casimiro Needle  Exercises - Seated Forward Bending  - 1 x daily - 7 x weekly - 2 sets - 10 reps - Seated Reaching to Side and Across Body  - 1 x daily - 7 x weekly - 2 sets - 10 reps - Seated Side Bend Elbow on Knee with Opposite Arm Reach Overhead -  1 x daily - 7 x weekly - 2 sets - 10 reps  GOALS: Goals reviewed with patient? Yes  SHORT TERM GOALS: Target date: 11/09/2023    Patient will be independent in home exercise program to improve strength/mobility for better functional independence with ADLs. Baseline: need to initiate 10/07/2023: provided Goal status: IN PROGRESS  LONG TERM GOALS: Target date: 12/21/2023  1.  Patient (< 35 years old) will complete five times sit to stand test in < 12 seconds indicating an increased LE strength and improved balance. Baseline: 09/28/23: 38.53 seconds 11/09/2023: 22.13 seconds with arms across chest Goal status: IN PROGRESS   2. Patient will reduce timed up and go to <11 seconds to reduce fall risk and demonstrate improved transfer/gait ability. Baseline: 09/28/23: 19.46 seconds without AD 11/09/2023: 16.96 seconds, no AD, with close SBA for balance safety Goal status: IN PROGRESS  3.  Patient will increase 10 meter walk test to >1.60m/s as to improve gait speed for better community ambulation and to reduce fall risk. Baseline: 09/28/23: 0.33m/s without AD 11/09/2023: 0.89 m/s (11.50 seconds seconds 10.94 seconds) no AD, with close SBA for safety Goal status: IN PROGRESS  4.  Patient will increase six minute walk test distance to >1032ft for progression to community ambulator and improve gait ability Baseline: 10/04/2023: 543ft (169m at 0.29m/s avg) 11/09/2023: 1061 feet (323 meters, Avg speed 0.897 m/s) without AD Goal status: MET and Advanced 11/09/2023 Goal Updated: distance > 1316ft  5. Patient will increase MiniBest Test score to >18/28 to indicate a reduced risk for falling and demonstrate increased independence with functional mobility and  ADLs.  Baseline: 10/04/2023: 8/28  11/16/2023: 16/28   Goal status: IN PROGRESS  6. Patient will increase ABC scale score >80% to demonstrate better functional mobility and better confidence with ADLs.   Baseline: 09/28/23: 17.5% 11/09/2023: 21.25% - pt reports he rated the test as is if he was not using his walker because this is his long term goal   Goal status: IN PROGRESS   ASSESSMENT:  CLINICAL IMPRESSION:   Patient arrived motivated and excited to participate in therapy session. Therapy session focused on re-assessment of MiniBest Test to determine patient's progress with standing balance and dynamic gait. Pt continues to demonstrate high fall risk; however, has had a clinically significant improvement on the test. Pt demonstrates greatest impairment with items including: gait tasks resulting in slowed gait speed, standing on airex, standing on incline, single-leg-stance on R LE, and posterior stepping strategy. Therapist also assessed oculomotor movements due to neurology note from 01/08/2022 reporting concern of decreased vertical eye movements; however, oculomotor ROM appears WNL, but did notice slowed movements with saccades. Patient will benefit from further skilled PT to improve these deficits in order to increase QOL and ease/safety with ADLs and functional mobility as well as decrease risk for falls.   OBJECTIVE IMPAIRMENTS: Abnormal gait, decreased activity tolerance, decreased balance, decreased coordination, decreased endurance, decreased knowledge of condition, decreased knowledge of use of DME, decreased mobility, difficulty walking, decreased ROM, decreased strength, decreased safety awareness, improper body mechanics, postural dysfunction, and pain.   ACTIVITY LIMITATIONS: carrying, lifting, bending, standing, squatting, sleeping, stairs, transfers, bed mobility, continence, bathing, toileting, dressing, reach over head, hygiene/grooming, and locomotion level  PARTICIPATION  LIMITATIONS: meal prep, cleaning, laundry, and community activity  PERSONAL FACTORS: Fitness and 3+ comorbidities: Vitamin D deficiency, Hyperlipidemia, and HTN  are also affecting patient's functional outcome.   REHAB POTENTIAL: Good  CLINICAL DECISION MAKING: Evolving/moderate complexity  EVALUATION COMPLEXITY: Moderate  PLAN:  PT FREQUENCY: 1-2x/week  PT DURATION: 12 weeks  PLANNED INTERVENTIONS: 97164- PT Re-evaluation, 97110-Therapeutic exercises, 97530- Therapeutic activity, 97112- Neuromuscular re-education, 97535- Self Care, 16109- Manual therapy, [phone]- Gait training, [phone]- Orthotic Fit/training, [phone]- Canalith repositioning, [phone]- Electrical stimulation (manual), Patient/Family education, Balance training, Stair training, Joint mobilization, Vestibular training, Visual/preceptual remediation/compensation, DME  instructions, Cryotherapy, and Moist heat  PLAN FOR NEXT SESSION:  - review and progress HEP  - assess bed mobility - gait training with increased intensity - dynamic gait with focus on increased upright posture and speed of ambulation with arm swing (add AWs to UEs?)  - add dual-task challenges of head rotations, stepping over obstacles, sudden turns, and changes in gait speed - stepping in different directions   - with dual-task of Blaze Pods to promote increased speed/amplitude of movements  - continue arm reaching and trunk rotation (specifically to R) - standing balance: narrow BOS/SLS, on compliant surfaces, incline, eyes open&eyes closed - stepping strategy re-training: focus on posterior stepping - large amplitude UE movements with promotion of improved upright posture - B LE strengthening (R>L)  - progress to floor transfers    Carlen Chasten, PT, DPT, NCS, CSRS Physical Therapist - [facility]  [facility]  10:33 AM 11/16/23

## 2023-11-17 NOTE — Therapy (Signed)
 Occupational TherapyNeuro Treatment Note  Patient Name: Daniel Daugherty MRN: [id number] DOB:22-Sep-1965, 58 y.o., male Today's Date: 11/17/2023  PCP: N/A REFERRING PROVIDER: Norval Morton, MD  END OF SESSION:  OT End of Session - 11/17/23 0902     Visit Number 11    Number of Visits 24    Date for OT Re-Evaluation 12/21/23    OT Start Time 1015    OT Stop Time 1100    OT Time Calculation (min) 45 min    Activity Tolerance Patient tolerated treatment well    Behavior During Therapy WFL for tasks assessed/performed                 Past Medical History:  Diagnosis Date   Hypertension    Past Surgical History:  Procedure Laterality Date   DG HAND LEFT COMPLETE ([facility] HX) Left 04/13/1999   There are no active problems to display for this patient.   ONSET DATE: 05/2021  REFERRING DIAG: Parkinson's Disease  THERAPY DIAG:  Muscle weakness (generalized)  Rationale for Evaluation and Treatment: Rehabilitation  SUBJECTIVE:   SUBJECTIVE STATEMENT: Pt. Feeling good today. Pt accompanied by: self  PERTINENT HISTORY:   Pt. is a 58 y.o. male who was diagnosed with Parkinson's Disease in 2022. Pt. reports having had a progressive decline in function since having hernia surgery, and right shoulder rotator cuff surgery. PMHx included: HTN. Of note; Pt. was being followed by [facility], and receiving Home health therapy services. Pt. changed physicians, and was referred for outpatient rehab services here.  PRECAUTIONS: None  WEIGHT BEARING RESTRICTIONS: No  PAIN:  Are you having pain? Intermittent 3/10 neck and right shoulder pain.  FALLS: Has patient fallen in last 6 months? No, 1 small slip near the bed tripped over shoes  LIVING ENVIRONMENT: Lives with: lives alone Lives in: [place],  Stairs: Entry level Has following equipment at home: Quad cane small base, rollator, shower chair, grab bars, hand rail at [employer]  PLOF: Independent  PATIENT GOALS: Improve  self-care  OBJECTIVE:  Note: Objective measures were completed at Evaluation unless otherwise noted.  HAND DOMINANCE: Right  ADLs:  Assist at home:  Ex wife, and children assist several times a week  Transfers/ambulation related to ADLs: Modified Independent Eating: Difficulty using dominant right hand. Drops food when scooping, Both hands for stabilizing drinks, and uses a straw. Unable to cut food.  Grooming: Increased time required for all grooming tasks. UB Dressing: Difficulty managing buttons LB Dressing: Less assist with pants with elastic, more assist with dress pants Toileting: Increased time for hygiene. Bathing: Requires increased time to complete Tub Shower transfers: Modified Independence with increased time  IADLs: Shopping: Son assists with grocery shopping Light housekeeping: Son's assist. Pt. Does light rising off of dishes. Meal Prep: Increased time. Independent with microwave, and air fryer. Difficulty opening bottles, containers Community mobility:  Transportation, light driving to grocery store Medication management:  Son's set-up weekly pillbox, Pt. Is responsible for taking the medications Financial management: Son's assist with monthly finances Handwriting: 50% legible Hobbies: Dancing, watching sports Career/work: Restaurant manager, fast food  MOBILITY STATUS: Uses a cane; shuffling gait  FUNCTIONAL OUTCOME MEASURES:  TBD  UPPER EXTREMITY ROM:    Active ROM Right eval Right 11/09/23 Left Eval [facility] overall  Shoulder flexion WFL 120(134)   Shoulder abduction 52(100) 60(110)   Shoulder adduction     Shoulder extension     Shoulder internal rotation     Shoulder external rotation  Elbow flexion [facility] WFL   Elbow extension [facility]   Wrist flexion     Wrist extension [facility]   Wrist ulnar deviation     Wrist radial deviation     Wrist pronation     Wrist supination     (Blank rows = not tested)  UPPER EXTREMITY MMT:     MMT Right eval  Right 11/09/23 Left Eval 4-/5 overall Left 11/09/23 4/5 Overall  Shoulder flexion 3/5 3-/5    Shoulder abduction 2/5 2/5    Shoulder adduction      Shoulder extension      Shoulder internal rotation      Shoulder external rotation      Middle trapezius      Lower trapezius      Elbow flexion 3/5 3+/5    Elbow extension 3/5 3+5    Wrist flexion      Wrist extension 3/5 3/5    Wrist ulnar deviation      Wrist radial deviation      Wrist pronation      Wrist supination      (Blank rows = not tested)  HAND FUNCTION:   Eval: Grip strength: Right: 50 lbs; Left: 34 lbs, Lateral pinch: Right: 17 lbs, Left: 9 lbs, 3 point pinch: Right: 12 lbs, Left: 11 lbs  11/09/23: Grip strength: Right: 60 lbs; Left: 35 lbs, Lateral pinch: Right: 19 lbs, Left: 11 lbs, 3 point pinch: Right: 13 lbs, Left: 9 lbs  COORDINATION:  Eval: 9 Hole Peg test: Right: 1 min. & 46 sec; Left: 53 sec  11/09/2023: 9 Hole Peg test: Right: 1 min. & 22 sec; Left: 46 sec   SENSATION:  Light touch: WFL Proprioception: WFL  EDEMA: N/A  COGNITION: Overall cognitive status: Within functional limits for tasks assessed  VISION:  Does not wear glasses.  VISION ASSESSMENT: To be further assessed in functional context  PERCEPTION: WFL  PRAXIS: Impaired: Motor planning                                                                                                                         TREATMENT DATE: 11/16/23  Therapeutic Ex:   -A/AAROM for the RUE through all joint ranges of motion. Following manual therapy. Janine Melbourne for the RUE at the tabletop -Progressive gross grip strengthening with 23.4# of grip strength resistive force.     Manual Therapy:   -Soft tissue massage was performed to the scapular, and UE musculature 2/2 tightness -Pt. tolerated scapular mobilization for elevation, depression, and abduction/rotation in sitting to normalize tone, decrease tightness, and prepare for ROM.   -Manual therapy was  performed independent of, and in preparation for therapeutic Ex.    Neuromuscular re-education:  -[facility] skills training to improve speed and dexterity needed for ADL tasks and writing. Pt. demonstrated grasping 1 inch sticks with his right hand, and worked on placing them vertically in the pegboard placed flat at the tabletop surface. -Facilitated bilateral alternating  hand movements removing the pegs.      PATIENT EDUCATION: Education details: UE ROM, ADL functional status Person educated: Patient Education method: Explanation, Demonstration, Tactile cues, and Verbal cues Education comprehension: needs further education  HOME EXERCISE PROGRAM:  Continue to assess, and provide as indicated.   GOALS: Goals reviewed with patient? Yes  SHORT TERM GOALS: Target date: 11/09/2023    Pt. Will be independent with HEPs for RUE. Baseline: 11/09/2023: Independent Eval: No current HEP Goal status:Ongoing  LONG TERM GOALS: Target date: 12/21/2023  1.  Pt. Will increase right shoulder abduction by 10 degrees to assist with UE dressing Baseline: 11/09/23: Right 60/(110) Eval: Right 52(100) Goal status: Ongoing  2.  Pt. Will increase BUE strength by 2 mm grades to assist with ADLs. Baseline: 11/09/23: Right: shoulder flexion: 3-/5, abduction: 2/5, elbow flexion/extension: 3+/5, wrist extension: 3/5. Left: 4/5 overallEval: Right: shoulder flexion: 3/5, abduction: 2/5, elbow flexion/extension: 3/5, wrist extension: 3/5. Left: 4-/5 overall Goal status: Ongoing  3.  Pt. Will perform self-feeding with modified independence Baseline: 11/09/23: Pt. Continues to have difficulty using dominant right hand. Drops food when scooping, Both hands for stabilizing drinks, and uses a straw. Unable to cut food. Eval: Difficulty using dominant right hand. Drops food when scooping, Both hands for stabilizing drinks, and uses a straw. Unable to cut food. Goal status: Ongoing  4.  Pt. Will demonstrate independence with  proper A/E techniques/compensatory strategies for ADL/IADLs.  Baseline: 11/09/23: Continue Eval: Education to be provided Goal status: Ongoing  5.  Pt. Will write a sentence with 75% legibility with modified independence Baseline: 11/09/2023: 50% legibility for one sentence. Eval: 50% legibility for one printed sentence Goal status: Ongoing    ASSESSMENT:  CLINICAL IMPRESSION:  Pt. presents with increased tone, and tightness through the RUE, and hand. Pt. responded well to manual therapy, and ROM with increased ROM following. Pt. continues to present with neck, and right shoulder pain, reporting 3/10 pain today.  Pt. required verbal cues, and cues for visual demonstration of proper technique for the [facility] tasks, and requires increased time to complete. Pt. continues to benefit from OT services to work on improving overall bilateral UE functioning in order to maximize engagement in, and efficiency in ADLs, and IADL tasks, and provide education about compensatory strategies.     PERFORMANCE DEFICITS: in functional skills including ADLs, IADLs, coordination, dexterity, proprioception, sensation, ROM, strength, pain, Fine motor control, Gross motor control, and UE functional use, cognitive skills including , and psychosocial skills including coping strategies, environmental adaptation, and routines and behaviors.   IMPAIRMENTS: are limiting patient from ADLs, IADLs, and leisure.   CO-MORBIDITIES: may have co-morbidities  that affects occupational performance. Patient will benefit from skilled OT to address above impairments and improve overall function.  MODIFICATION OR ASSISTANCE TO COMPLETE EVALUATION: Min-Moderate modification of tasks or assist with assess necessary to complete an evaluation.  OT OCCUPATIONAL PROFILE AND HISTORY: Detailed assessment: Review of records and additional review of physical, cognitive, psychosocial history related to current functional performance.  CLINICAL DECISION  MAKING: Moderate - several treatment options, min-mod task modification necessary  REHAB POTENTIAL: Good  EVALUATION COMPLEXITY: Moderate    PLAN:  OT FREQUENCY: 2x/week  OT DURATION: 12 weeks  PLANNED INTERVENTIONS: 97168 OT Re-evaluation, 97535 self care/ADL training, 16109 therapeutic exercise, 97530 therapeutic activity, 97112 neuromuscular re-education, 97140 manual therapy, 97018 paraffin, 60454 moist heat, 97034 contrast bath, 97760 Orthotics management and training, 09811 Splinting (initial encounter), energy conservation, patient/family education, and DME and/or AE  instructions  RECOMMENDED OTHER SERVICES:   PT  CONSULTED AND AGREED WITH PLAN OF CARE: Patient  PLAN FOR NEXT SESSION:   Treatment  Karishma Unrein, MS, OTR/L   11/17/2023, 9:06 AM

## 2023-11-18 ENCOUNTER — Ambulatory Visit: Payer: 59 | Admitting: Physical Therapy

## 2023-11-18 ENCOUNTER — Ambulatory Visit: Payer: 59 | Admitting: Occupational Therapy

## 2023-11-18 DIAGNOSIS — R278 Other lack of coordination: Secondary | ICD-10-CM | POA: Diagnosis not present

## 2023-11-18 DIAGNOSIS — M6281 Muscle weakness (generalized): Secondary | ICD-10-CM

## 2023-11-18 DIAGNOSIS — R2681 Unsteadiness on feet: Secondary | ICD-10-CM

## 2023-11-18 DIAGNOSIS — R531 Weakness: Secondary | ICD-10-CM

## 2023-11-18 DIAGNOSIS — R269 Unspecified abnormalities of gait and mobility: Secondary | ICD-10-CM

## 2023-11-18 NOTE — Therapy (Signed)
 OUTPATIENT PHYSICAL THERAPY NEURO TREATMENT   Patient Name: Daniel Daugherty MRN: [id number] DOB:30-Mar-1966, 58 y.o., male Today's Date: 11/18/2023   PCP: Tawnya Fava, Waddell Guess, MD  REFERRING PROVIDER: Tawnya Fava, Waddell Guess, MD   END OF SESSION:   PT End of Session - 11/18/23 0934     Visit Number 12    Number of Visits 24    Date for PT Re-Evaluation 12/21/23    Authorization Type Medicare 2025    PT Start Time 0935    PT Stop Time 1016    PT Time Calculation (min) 41 min    Equipment Utilized During Treatment Gait belt    Activity Tolerance Patient tolerated treatment well    Behavior During Therapy WFL for tasks assessed/performed               Past Medical History:  Diagnosis Date   Hypertension    Past Surgical History:  Procedure Laterality Date   DG HAND LEFT COMPLETE ([facility] HX) Left 04/13/1999   There are no active problems to display for this patient.   ONSET DATE: 2022 is when he noticed the change and that was around the same time he was diagnosed with Parkinson's  REFERRING DIAG:  G20.A1 (ICD-10-CM) - Parkinson disease (HCC)  Z74.1 (ICD-10-CM) - Requires daily assistance for activities of daily living (ADL) and comfort needs   THERAPY DIAG:  Muscle weakness (generalized)  Other lack of coordination  Abnormality of gait  Generalized weakness  Unsteadiness on feet  Rationale for Evaluation and Treatment: Rehabilitation  SUBJECTIVE:                                                                                                                                                                                             SUBJECTIVE STATEMENT:   Pt reports he has been doing alright. States he enjoyed celebrating his son's birthday. Denies any additional stumbles/falls since last visit, and reports no longer sore following the fall prior to last visit.  Pt states he is supposed to be setting up an appointment with neurologist at [facility]. Pt reports when he first takes the carbidopa/levodopa it makes him feel "whoozy" for initial 48min-1hr, but now states he tries to eat a snack with it and that helps, but only a little bit. Reports the medications make him feel "foggy." Pt states previously when he went to [facility] about his Parkinson's diagnosis, it was very depressive because he learned there is no cure. Reports, that experience has made him reluctant to go see another neurologist.   Pt accompanied by: self  PERTINENT HISTORY: Parkinsonism, Vitamin  D deficiency, Hyperlipidemia, HTN, poor medication compliance (per chart review)  PAIN:  Are you having pain? Yes: NPRS scale: 5/10, but states "it ain't that bad" Pain location: R shoulder and low back Pain description: throbbing in shoulder Aggravating factors: overhead movements or reaching back Relieving factors: rest and at home uses a "massager"  PRECAUTIONS: Fall  RED FLAGS: None   WEIGHT BEARING RESTRICTIONS: No  FALLS: Has patient fallen in last 6 months? Yes. 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   PATIENT GOALS: Get my balance right with my walking, help get my R arm stronger and be able to move it more, improve strength in my legs (specifically the Right)  OBJECTIVE:  Note: Objective measures were completed at Evaluation unless otherwise noted.  DIAGNOSTIC FINDINGS:  N/A  COGNITION: Overall cognitive status: Within functional limits for tasks assessed; however, did not assess higher level cognitive tasks   SENSATION: WFL Light touch on screen Pt reports R foot falls asleep sitting on toilet and he has difficult time getting feeling back in it before feeling safe standing up.  COORDINATION: Overall bradykinesia with decreased speed and amplitude of movements  EDEMA:  None present  MUSCLE TONE: Not formally assessed  MUSCLE LENGTH: Not formally assessed, but may have some hamstring tightness on R associated with quad weakness and inability to achieve full knee extension in sitting  DTRs:  Not assessed  POSTURE: rounded shoulders, forward head, increased thoracic kyphosis, posterior pelvic tilt, and flexed trunk    LOWER EXTREMITY ROM:     Active ROM Right Eval Left Eval  Hip flexion Decreased in sitting compared to L   Hip extension    Hip abduction    Hip adduction    Hip internal rotation    Hip external rotation    Knee flexion    Knee extension Lacks full knee extension in sitting   Ankle dorsiflexion Limited in sitting Limited in sitting  Ankle plantarflexion    Ankle inversion    Ankle eversion     (Blank rows = not tested)  LOWER EXTREMITY MMT:    MMT Right Eval Left Eval  Hip flexion 3- 4+  Hip extension    Hip abduction    Hip adduction    Hip internal rotation    Hip external rotation    Knee flexion 3 4+  Knee extension 3- 4+  Ankle dorsiflexion 3- 4+  Ankle plantarflexion 3- 4+  Ankle inversion    Ankle eversion    (Blank rows = not tested)  Manual Muscle Test Scale 0/5 = No muscle contraction can be seen or felt 1/5 = Contraction can be felt, but there is no motion 2-/5 = Part moves through incomplete ROM w/ gravity decreased 2/5 = Part moves through complete ROM w/ gravity decreased 2+/5 = Part moves through incomplete ROM (<50%) against gravity or through complete ROM w/ gravity 3-/5 = Part moves  through incomplete ROM (>50%) against gravity 3/5 =  Part moves through complete ROM against gravity 3+/5 = Part moves through complete ROM against gravity/slight resistance 4-/5= Holds test position against slight to moderate pressure 4/5 = Part moves through complete ROM against gravity/moderate resistance 4+/5= Holds test position against moderate to strong pressure 5/5 = Part moves through complete ROM against gravity/full resistance  BED MOBILITY:  Sit to supine   Supine to sit   Need to assess  ADL: Reports dificulty getitng on R shoe, specifically having trouble pushing his foot down in his shoe  TRANSFERS: Assistive device utilized:  Hurry cane   Sit to stand: Modified independence and SBA Stand to sit: Modified independence and SBA Chair to chair: Modified independence and SBA Floor:  would benefit from testing in future  RAMP:  Level of Assistance:    Assistive device utilized:  Hurry cane Ramp Comments: would benefit from assessing  CURB:  Level of Assistance:    Assistive device utilized:  [employer] Comments: would benefit from assessing  STAIRS: Level of Assistance: CGA and Min A Stair Negotiation Technique: Step to Pattern Forwards with Bilateral Rails Number of Stairs: 4  Height of Stairs: 6in  Comments: leading with R LE in both directions, attempts reciprocal pattern on ascent with sufficient strength, but having uncontrolled anterior lean during descent  GAIT: Gait pattern: step through pattern, decreased arm swing- Right, decreased arm swing- Left, decreased step length- Right, decreased step length- Left, decreased stride length, decreased hip/knee flexion- Right, decreased hip/knee flexion- Left, decreased ankle dorsiflexion- Right, decreased ankle dorsiflexion- Left, decreased trunk rotation, trunk flexed, poor foot clearance- Right, and poor foot clearance- Left Distance walked: 170ft Assistive device utilized: None Level of assistance:  CGA Comments: significantly decreased  FUNCTIONAL TESTS:  5 times sit to stand: 38.53 seconds arms across chest Timed up and go (TUG): 19.46 seconds without AD 6 minute walk test: 10/04/2023: 553ft (199m at 0.60m/s avg) 10 meter walk test: 0.56 m/s without AD Mini-Best: 10/04/2023: 8/28  PATIENT SURVEYS:  ABC scale 17.5%  with pt feeling <40% confident on all of the items, pt feels only 30% confident walking around the house, 10% confident getting in/out of car, and 20% reaching for items  OCULOMOTOR EXAM from 11/16/2023 - performed due to Neurology note reporting concerns of decreased vertical gaze:  Ocular Alignment: normal  Ocular ROM: No Limitations  Spontaneous Nystagmus: absent  Gaze-Induced Nystagmus: absent  Smooth Pursuits: intact, good vertical movements with full range  Horizontal and Vertical Saccades: extra eye movements, slow, and frequently blinks, a few extra eye movements when moving to superior target                                                                                                                               TREATMENT DATE: 11/18/23  Therapy session spent providing extensive education to pt on importance of following up with a neurologist vs movement disorder specialist regarding his Parkinsonism diagnosis. Educated pt on expected motor  impairments with Parkinsonism. Educated pt to express his experience with the medications he has been prescribed thus far with the neurologist due to pt reporting no obvious improvement in his symptoms after taking the medications. Encouraged patient to call neurologist clinic to set-up appointment before next therapy appointment.   Unless otherwise stated, CGA/min A was provided and gait belt donned in order to ensure pt safety throughout.  Gait training ~551ft, using boxing gloves with focus on increased gait speed, longer step lengths, and increased arm swing - pt continues to have thoracic rounding with increased anterior  trunk lean, decreased step lengths landing on his toes, and lack of trunk rotation and arm swing - improvement with all of these when given patient an external target to aim for using the boxing gloves.    PATIENT EDUCATION: Education details: PT POC, Goals, results of outcome measures indicating increased fall risk Person educated: Patient Education method: Explanation Education comprehension: verbalized understanding and needs further education  HOME EXERCISE PROGRAM: Access Code: [email or web address] URL: [email or web address] Date: 10/07/2023 Prepared by: Carlen Chasten  Exercises - Seated Forward Bending  - 1 x daily - 7 x weekly - 2 sets - 10 reps - Seated Reaching to Side and Across Body  - 1 x daily - 7 x weekly - 2 sets - 10 reps - Seated Side Bend Elbow on Knee with Opposite Arm Reach Overhead -  1 x daily - 7 x weekly - 2 sets - 10 reps  GOALS: Goals reviewed with patient? Yes  SHORT TERM GOALS: Target date: 11/09/2023    Patient will be independent in home exercise program to improve strength/mobility for better functional independence with ADLs. Baseline: need to initiate 10/07/2023: provided Goal status: IN PROGRESS   LONG TERM GOALS: Target date: 12/21/2023  1.  Patient (< 49 years old) will complete five times sit to stand test in < 12 seconds indicating an increased LE strength and improved balance. Baseline: 09/28/23: 38.53 seconds 11/09/2023: 22.13 seconds with arms across chest Goal status: IN PROGRESS   2. Patient will reduce timed up and go to <11 seconds to reduce fall risk and demonstrate improved transfer/gait ability. Baseline: 09/28/23: 19.46 seconds without AD 11/09/2023: 16.96 seconds, no AD, with close SBA for balance safety Goal status: IN PROGRESS  3.  Patient will increase 10 meter walk test to >1.40m/s as to improve gait speed for better community ambulation and to reduce fall risk. Baseline: 09/28/23: 0.36m/s without AD 11/09/2023: 0.89 m/s (11.50  seconds seconds 10.94 seconds) no AD, with close SBA for safety Goal status: IN PROGRESS  4.  Patient will increase six minute walk test distance to >1053ft for progression to community ambulator and improve gait ability Baseline: 10/04/2023: 536ft (161m at 0.53m/s avg) 11/09/2023: 1061 feet (323 meters, Avg speed 0.897 m/s) without AD Goal status: MET and Advanced 11/09/2023 Goal Updated: distance > 1325ft  5. Patient will increase MiniBest Test score to >18/28 to indicate a reduced risk for falling and demonstrate increased independence with functional mobility and ADLs.  Baseline: 10/04/2023: 8/28  11/16/2023: 16/28   Goal status: IN PROGRESS  6. Patient will increase ABC scale score >80% to demonstrate better functional mobility and better confidence with ADLs.   Baseline: 09/28/23: 17.5% 11/09/2023: 21.25% - pt reports he rated the test as is if he was not using his walker because this is his long term goal   Goal status: IN PROGRESS   ASSESSMENT:  CLINICAL IMPRESSION:   Patient arrived motivated to participate  in therapy session. Therapy session focused on extensive patient education on the importance of having follow-up with a neurologist or movement disorder specialist to help him manage his Parkinsonism. Therapist encouraging patient to prioritize calling to set-up an appointment prior to next therapy session. Remaining of therapy session focused on increased intensity of gait training wearing boxing gloves with therapist providing external target to hit with his hands to promote increased arm swing, increased step lengths with heel strike on initial contact, and overall increased upright posture and gait speed. Mr. Blanchfield will benefit from further skilled PT to improve these deficits in order to increase QOL and ease/safety with ADLs and functional mobility as well as decrease risk for falls.   OBJECTIVE IMPAIRMENTS: Abnormal gait, decreased activity tolerance, decreased balance, decreased  coordination, decreased endurance, decreased knowledge of condition, decreased knowledge of use of DME, decreased mobility, difficulty walking, decreased ROM, decreased strength, decreased safety awareness, improper body mechanics, postural dysfunction, and pain.   ACTIVITY LIMITATIONS: carrying, lifting, bending, standing, squatting, sleeping, stairs, transfers, bed mobility, continence, bathing, toileting, dressing, reach over head, hygiene/grooming, and locomotion level  PARTICIPATION LIMITATIONS: meal prep, cleaning, laundry, and community activity  PERSONAL FACTORS: Fitness and 3+ comorbidities: Vitamin D deficiency, Hyperlipidemia, and HTN  are also affecting patient's functional outcome.   REHAB POTENTIAL: Good  CLINICAL DECISION MAKING: Evolving/moderate complexity  EVALUATION COMPLEXITY: Moderate  PLAN:  PT FREQUENCY: 1-2x/week  PT DURATION: 12 weeks  PLANNED INTERVENTIONS: 97164- PT Re-evaluation, 97110-Therapeutic exercises, 97530- Therapeutic activity, 97112- Neuromuscular re-education, 97535- Self Care, 16109- Manual therapy, [phone]- Gait training, [phone]- Orthotic Fit/training, [phone]- Canalith repositioning, [phone]- Electrical stimulation (manual), Patient/Family education, Balance training, Stair training, Joint mobilization, Vestibular training, Visual/preceptual remediation/compensation, DME instructions, Cryotherapy, and Moist heat  PLAN FOR NEXT SESSION:  - review and progress HEP  - assess bed mobility - gait training with increased intensity - dynamic gait with focus on increased upright posture and speed of ambulation with arm swing (add AWs to UEs with boxing gloves)  - add dual-task challenges of head rotations, stepping over obstacles, sudden turns, and changes in gait speed - stepping in different directions   - with dual-task of Blaze Pods to promote increased speed/amplitude of movements  - continue arm reaching and trunk rotation (specifically to R) - standing  balance: narrow BOS/SLS, on compliant surfaces, incline, eyes open&eyes closed - stepping strategy re-training: focus on posterior stepping - large amplitude UE movements with promotion of improved upright posture - B LE strengthening (R>L)  - progress to floor transfers    Carlen Chasten, PT, DPT, NCS, CSRS Physical Therapist - [facility]  [facility]  10:18 AM 11/18/23

## 2023-11-18 NOTE — Therapy (Signed)
 Occupational TherapyNeuro Treatment Note  Patient Name: Daniel Daugherty MRN: [id number] DOB:September 19, 1965, 58 y.o., male Today's Date: 11/18/2023  PCP: N/A REFERRING PROVIDER: Arlen Belton, MD  END OF SESSION:  OT End of Session - 11/18/23 1136     Visit Number 12    Number of Visits 24    Date for OT Re-Evaluation 12/21/23    OT Start Time 1015    OT Stop Time 1100    OT Time Calculation (min) 45 min    Activity Tolerance Patient tolerated treatment well    Behavior During Therapy WFL for tasks assessed/performed                 Past Medical History:  Diagnosis Date   Hypertension    Past Surgical History:  Procedure Laterality Date   DG HAND LEFT COMPLETE ([facility] HX) Left 04/13/1999   There are no active problems to display for this patient.   ONSET DATE: 05/2021  REFERRING DIAG: Parkinson's Disease  THERAPY DIAG:  Muscle weakness (generalized)  Rationale for Evaluation and Treatment: Rehabilitation  SUBJECTIVE:   SUBJECTIVE STATEMENT:  Pt. reports he is planning to call to make an appointment with a Neurologist Pt accompanied by: self  PERTINENT HISTORY:   Pt. is a 58 y.o. male who was diagnosed with Parkinson's Disease in 2022. Pt. reports having had a progressive decline in function since having hernia surgery, and right shoulder rotator cuff surgery. PMHx included: HTN. Of note; Pt. was being followed by [facility], and receiving Home health therapy services. Pt. changed physicians, and was referred for outpatient rehab services here.  PRECAUTIONS: None  WEIGHT BEARING RESTRICTIONS: No  PAIN:  Are you having pain? 0/10  FALLS: Has patient fallen in last 6 months? No, 1 small slip near the bed tripped over shoes  LIVING ENVIRONMENT: Lives with: lives alone Lives in: [place],  Stairs: Entry level Has following equipment at home: Quad cane small base, rollator, shower chair, grab bars, hand rail at [employer]  PLOF: Independent  PATIENT GOALS:  Improve self-care  OBJECTIVE:  Note: Objective measures were completed at Evaluation unless otherwise noted.  HAND DOMINANCE: Right  ADLs:  Assist at home:  Ex wife, and children assist several times a week  Transfers/ambulation related to ADLs: Modified Independent Eating: Difficulty using dominant right hand. Drops food when scooping, Both hands for stabilizing drinks, and uses a straw. Unable to cut food.  Grooming: Increased time required for all grooming tasks. UB Dressing: Difficulty managing buttons LB Dressing: Less assist with pants with elastic, more assist with dress pants Toileting: Increased time for hygiene. Bathing: Requires increased time to complete Tub Shower transfers: Modified Independence with increased time  IADLs: Shopping: Son assists with grocery shopping Light housekeeping: Son's assist. Pt. Does light rising off of dishes. Meal Prep: Increased time. Independent with microwave, and air fryer. Difficulty opening bottles, containers Community mobility:  Transportation, light driving to grocery store Medication management:  Son's set-up weekly pillbox, Pt. Is responsible for taking the medications Financial management: Son's assist with monthly finances Handwriting: 50% legible Hobbies: Dancing, watching sports Career/work: Restaurant manager, fast food  MOBILITY STATUS: Uses a cane; shuffling gait  FUNCTIONAL OUTCOME MEASURES:  TBD  UPPER EXTREMITY ROM:    Active ROM Right eval Right 11/09/23 Left Eval [facility] overall  Shoulder flexion WFL 120(134)   Shoulder abduction 52(100) 60(110)   Shoulder adduction     Shoulder extension     Shoulder internal rotation     Shoulder external  rotation     Elbow flexion [facility] WFL   Elbow extension [facility]   Wrist flexion     Wrist extension [facility]   Wrist ulnar deviation     Wrist radial deviation     Wrist pronation     Wrist supination     (Blank rows = not tested)  UPPER EXTREMITY MMT:     MMT Right eval  Right 11/09/23 Left Eval 4-/5 overall Left 11/09/23 4/5 Overall  Shoulder flexion 3/5 3-/5    Shoulder abduction 2/5 2/5    Shoulder adduction      Shoulder extension      Shoulder internal rotation      Shoulder external rotation      Middle trapezius      Lower trapezius      Elbow flexion 3/5 3+/5    Elbow extension 3/5 3+5    Wrist flexion      Wrist extension 3/5 3/5    Wrist ulnar deviation      Wrist radial deviation      Wrist pronation      Wrist supination      (Blank rows = not tested)  HAND FUNCTION:   Eval: Grip strength: Right: 50 lbs; Left: 34 lbs, Lateral pinch: Right: 17 lbs, Left: 9 lbs, 3 point pinch: Right: 12 lbs, Left: 11 lbs  11/09/23: Grip strength: Right: 60 lbs; Left: 35 lbs, Lateral pinch: Right: 19 lbs, Left: 11 lbs, 3 point pinch: Right: 13 lbs, Left: 9 lbs  COORDINATION:  Eval: 9 Hole Peg test: Right: 1 min. & 46 sec; Left: 53 sec  11/09/2023: 9 Hole Peg test: Right: 1 min. & 22 sec; Left: 46 sec   SENSATION:  Light touch: WFL Proprioception: WFL  EDEMA: N/A  COGNITION: Overall cognitive status: Within functional limits for tasks assessed  VISION:  Does not wear glasses.  VISION ASSESSMENT: To be further assessed in functional context  PERCEPTION: WFL  PRAXIS: Impaired: Motor planning                                                                                                                         TREATMENT DATE: 11/18/23  Therapeutic Ex:   -A/AAROM for the RUE through all joint ranges of motion following manual therapy.  Manual Therapy:   -Soft tissue massage was performed to the scapular, and UE musculature 2/2 tightness -Pt. tolerated scapular mobilization for elevation, depression, and abduction/rotation in sitting to normalize tone, decrease tightness, and prepare for ROM.   -Manual therapy was performed independent of, and in preparation for therapeutic Ex.    Neuromuscular re-education:  -Focused on writing  skills writing 2 sentences with increased time required -75% legibility in printed form     PATIENT EDUCATION: Education details: UE ROM, ADL functional status Person educated: Patient Education method: Explanation, Demonstration, Tactile cues, and Verbal cues Education comprehension: needs further education  HOME EXERCISE PROGRAM:  Continue to assess, and provide as indicated.  GOALS: Goals reviewed with patient? Yes  SHORT TERM GOALS: Target date: 11/09/2023    Pt. Will be independent with HEPs for RUE. Baseline: 11/09/2023: Independent Eval: No current HEP Goal status:Ongoing  LONG TERM GOALS: Target date: 12/21/2023  1.  Pt. Will increase right shoulder abduction by 10 degrees to assist with UE dressing Baseline: 11/09/23: Right 60/(110) Eval: Right 52(100) Goal status: Ongoing  2.  Pt. Will increase BUE strength by 2 mm grades to assist with ADLs. Baseline: 11/09/23: Right: shoulder flexion: 3-/5, abduction: 2/5, elbow flexion/extension: 3+/5, wrist extension: 3/5. Left: 4/5 overallEval: Right: shoulder flexion: 3/5, abduction: 2/5, elbow flexion/extension: 3/5, wrist extension: 3/5. Left: 4-/5 overall Goal status: Ongoing  3.  Pt. Will perform self-feeding with modified independence Baseline: 11/09/23: Pt. Continues to have difficulty using dominant right hand. Drops food when scooping, Both hands for stabilizing drinks, and uses a straw. Unable to cut food. Eval: Difficulty using dominant right hand. Drops food when scooping, Both hands for stabilizing drinks, and uses a straw. Unable to cut food. Goal status: Ongoing  4.  Pt. Will demonstrate independence with proper A/E techniques/compensatory strategies for ADL/IADLs.  Baseline: 11/09/23: Continue Eval: Education to be provided Goal status: Ongoing  5.  Pt. Will write a sentence with 75% legibility with modified independence Baseline: 11/09/2023: 50% legibility for one sentence. Eval: 50% legibility for one printed  sentence Goal status: Ongoing    ASSESSMENT:  CLINICAL IMPRESSION:  Pt. presents with increased tone, and tightness through the RUE, and hand. Pt. responded well to manual therapy, and ROM. Pt. presents with increased ROM, and presented with increased tolerance for ROM today. Pt. presented with 75% legibility for writing. Pt. required verbal cues, and cues for visual demonstration of proper technique for the [facility] tasks, and requires increased time to complete. Pt. continues to benefit from OT services to work on improving overall bilateral UE functioning in order to maximize engagement in, and efficiency in ADLs, and IADL tasks, and provide education about compensatory strategies.     PERFORMANCE DEFICITS: in functional skills including ADLs, IADLs, coordination, dexterity, proprioception, sensation, ROM, strength, pain, Fine motor control, Gross motor control, and UE functional use, cognitive skills including , and psychosocial skills including coping strategies, environmental adaptation, and routines and behaviors.   IMPAIRMENTS: are limiting patient from ADLs, IADLs, and leisure.   CO-MORBIDITIES: may have co-morbidities  that affects occupational performance. Patient will benefit from skilled OT to address above impairments and improve overall function.  MODIFICATION OR ASSISTANCE TO COMPLETE EVALUATION: Min-Moderate modification of tasks or assist with assess necessary to complete an evaluation.  OT OCCUPATIONAL PROFILE AND HISTORY: Detailed assessment: Review of records and additional review of physical, cognitive, psychosocial history related to current functional performance.  CLINICAL DECISION MAKING: Moderate - several treatment options, min-mod task modification necessary  REHAB POTENTIAL: Good  EVALUATION COMPLEXITY: Moderate    PLAN:  OT FREQUENCY: 2x/week  OT DURATION: 12 weeks  PLANNED INTERVENTIONS: 97168 OT Re-evaluation, 97535 self care/ADL training, 16109  therapeutic exercise, 97530 therapeutic activity, 97112 neuromuscular re-education, 97140 manual therapy, 97018 paraffin, 60454 moist heat, 97034 contrast bath, 97760 Orthotics management and training, 09811 Splinting (initial encounter), energy conservation, patient/family education, and DME and/or AE instructions  RECOMMENDED OTHER SERVICES:   PT  CONSULTED AND AGREED WITH PLAN OF CARE: Patient  PLAN FOR NEXT SESSION:   Treatment  Olegario Messier, MS, OTR/L   11/18/2023, 11:51 AM

## 2023-11-23 ENCOUNTER — Ambulatory Visit: Payer: 59 | Admitting: Occupational Therapy

## 2023-11-23 ENCOUNTER — Ambulatory Visit: Payer: 59 | Admitting: Physical Therapy

## 2023-11-25 ENCOUNTER — Ambulatory Visit: Payer: 59 | Admitting: Occupational Therapy

## 2023-11-25 ENCOUNTER — Ambulatory Visit: Payer: 59 | Admitting: Physical Therapy

## 2023-11-30 ENCOUNTER — Ambulatory Visit: Payer: 59 | Admitting: Occupational Therapy

## 2023-11-30 ENCOUNTER — Ambulatory Visit: Payer: 59 | Admitting: Physical Therapy

## 2023-11-30 DIAGNOSIS — M6281 Muscle weakness (generalized): Secondary | ICD-10-CM

## 2023-11-30 DIAGNOSIS — R2681 Unsteadiness on feet: Secondary | ICD-10-CM

## 2023-11-30 DIAGNOSIS — R531 Weakness: Secondary | ICD-10-CM

## 2023-11-30 DIAGNOSIS — R278 Other lack of coordination: Secondary | ICD-10-CM

## 2023-11-30 DIAGNOSIS — R269 Unspecified abnormalities of gait and mobility: Secondary | ICD-10-CM

## 2023-11-30 NOTE — Therapy (Signed)
 Occupational TherapyNeuro Treatment Note  Patient Name: Daniel Daugherty MRN: [id number] DOB:Jan 07, 1966, 58 y.o., male Today's Date: 11/30/2023  PCP: N/A REFERRING PROVIDER: Arlen Belton, MD  END OF SESSION:  OT End of Session - 11/30/23 1732     Visit Number 13    Number of Visits 24    Date for OT Re-Evaluation 12/21/23    OT Start Time 1020    OT Stop Time 1100    OT Time Calculation (min) 40 min    Activity Tolerance Patient tolerated treatment well    Behavior During Therapy WFL for tasks assessed/performed                 Past Medical History:  Diagnosis Date   Hypertension    Past Surgical History:  Procedure Laterality Date   DG HAND LEFT COMPLETE ([facility] HX) Left 04/13/1999   There are no active problems to display for this patient.   ONSET DATE: 05/2021  REFERRING DIAG: Parkinson's Disease  THERAPY DIAG:  Muscle weakness (generalized)  Rationale for Evaluation and Treatment: Rehabilitation  SUBJECTIVE:   SUBJECTIVE STATEMENT:  Pt. reports  that he is going on a cruise with his family for his birthday in July. Pt accompanied by: self  PERTINENT HISTORY:   Pt. is a 58 y.o. male who was diagnosed with Parkinson's Disease in 2022. Pt. reports having had a progressive decline in function since having hernia surgery, and right shoulder rotator cuff surgery. PMHx included: HTN. Of note; Pt. was being followed by [facility], and receiving Home health therapy services. Pt. changed physicians, and was referred for outpatient rehab services here.  PRECAUTIONS: None  WEIGHT BEARING RESTRICTIONS: No  PAIN:  Are you having pain? 0/10  FALLS: Has patient fallen in last 6 months? No, 1 small slip near the bed tripped over shoes  LIVING ENVIRONMENT: Lives with: lives alone Lives in: [place],  Stairs: Entry level Has following equipment at home: Quad cane small base, rollator, shower chair, grab bars, hand rail at [employer]  PLOF: Independent  PATIENT  GOALS: Improve self-care  OBJECTIVE:  Note: Objective measures were completed at Evaluation unless otherwise noted.  HAND DOMINANCE: Right  ADLs:  Assist at home:  Ex wife, and children assist several times a week  Transfers/ambulation related to ADLs: Modified Independent Eating: Difficulty using dominant right hand. Drops food when scooping, Both hands for stabilizing drinks, and uses a straw. Unable to cut food.  Grooming: Increased time required for all grooming tasks. UB Dressing: Difficulty managing buttons LB Dressing: Less assist with pants with elastic, more assist with dress pants Toileting: Increased time for hygiene. Bathing: Requires increased time to complete Tub Shower transfers: Modified Independence with increased time  IADLs: Shopping: Son assists with grocery shopping Light housekeeping: Son's assist. Pt. Does light rising off of dishes. Meal Prep: Increased time. Independent with microwave, and air fryer. Difficulty opening bottles, containers Community mobility:  Transportation, light driving to grocery store Medication management:  Son's set-up weekly pillbox, Pt. Is responsible for taking the medications Financial management: Son's assist with monthly finances Handwriting: 50% legible Hobbies: Dancing, watching sports Career/work: Restaurant manager, fast food  MOBILITY STATUS: Uses a cane; shuffling gait  FUNCTIONAL OUTCOME MEASURES:  TBD  UPPER EXTREMITY ROM:    Active ROM Right eval Right 11/09/23 Left Eval [facility] overall  Shoulder flexion WFL 120(134)   Shoulder abduction 52(100) 60(110)   Shoulder adduction     Shoulder extension     Shoulder internal rotation  Shoulder external rotation     Elbow flexion [facility] WFL   Elbow extension [facility]   Wrist flexion     Wrist extension [facility]   Wrist ulnar deviation     Wrist radial deviation     Wrist pronation     Wrist supination     (Blank rows = not tested)  UPPER EXTREMITY MMT:     MMT  Right eval Right 11/09/23 Left Eval 4-/5 overall Left 11/09/23 4/5 Overall  Shoulder flexion 3/5 3-/5    Shoulder abduction 2/5 2/5    Shoulder adduction      Shoulder extension      Shoulder internal rotation      Shoulder external rotation      Middle trapezius      Lower trapezius      Elbow flexion 3/5 3+/5    Elbow extension 3/5 3+5    Wrist flexion      Wrist extension 3/5 3/5    Wrist ulnar deviation      Wrist radial deviation      Wrist pronation      Wrist supination      (Blank rows = not tested)  HAND FUNCTION:   Eval: Grip strength: Right: 50 lbs; Left: 34 lbs, Lateral pinch: Right: 17 lbs, Left: 9 lbs, 3 point pinch: Right: 12 lbs, Left: 11 lbs  11/09/23: Grip strength: Right: 60 lbs; Left: 35 lbs, Lateral pinch: Right: 19 lbs, Left: 11 lbs, 3 point pinch: Right: 13 lbs, Left: 9 lbs  COORDINATION:  Eval: 9 Hole Peg test: Right: 1 min. & 46 sec; Left: 53 sec  11/09/2023: 9 Hole Peg test: Right: 1 min. & 22 sec; Left: 46 sec   SENSATION:  Light touch: WFL Proprioception: WFL  EDEMA: N/A  COGNITION: Overall cognitive status: Within functional limits for tasks assessed  VISION:  Does not wear glasses.  VISION ASSESSMENT: To be further assessed in functional context  PERCEPTION: WFL  PRAXIS: Impaired: Motor planning                                                                                                                         TREATMENT DATE: 11/30/23  Therapeutic Ex:   -A/AAROM for the RUE through all joint ranges of motion following manual therapy. -BUE strengthening, and reciprocal motion using the UBE while seated for 8 min. with minimal resistance. -Lateral, and 3pt. Pinch strengthening using yellow, red, green, and blue, and black level resistive clips     Manual Therapy:   -Soft tissue massage was performed to the scapular, and UE musculature 2/2 tightness -Pt. tolerated scapular mobilization for elevation, depression, and  abduction/rotation in sitting to normalize tone, decrease tightness, and prepare for ROM.   -Manual therapy was performed independent of, and in preparation for therapeutic Ex.        PATIENT EDUCATION: Education details: UE ROM, ADL functional status Person educated: Patient Education method: Explanation, Demonstration, Tactile cues, and Verbal  cues Education comprehension: needs further education  HOME EXERCISE PROGRAM:  Continue to assess, and provide as indicated.   GOALS: Goals reviewed with patient? Yes  SHORT TERM GOALS: Target date: 11/09/2023    Pt. Will be independent with HEPs for RUE. Baseline: 11/09/2023: Independent Eval: No current HEP Goal status:Ongoing  LONG TERM GOALS: Target date: 12/21/2023  1.  Pt. Will increase right shoulder abduction by 10 degrees to assist with UE dressing Baseline: 11/09/23: Right 60/(110) Eval: Right 52(100) Goal status: Ongoing  2.  Pt. Will increase BUE strength by 2 mm grades to assist with ADLs. Baseline: 11/09/23: Right: shoulder flexion: 3-/5, abduction: 2/5, elbow flexion/extension: 3+/5, wrist extension: 3/5. Left: 4/5 overallEval: Right: shoulder flexion: 3/5, abduction: 2/5, elbow flexion/extension: 3/5, wrist extension: 3/5. Left: 4-/5 overall Goal status: Ongoing  3.  Pt. Will perform self-feeding with modified independence Baseline: 11/09/23: Pt. Continues to have difficulty using dominant right hand. Drops food when scooping, Both hands for stabilizing drinks, and uses a straw. Unable to cut food. Eval: Difficulty using dominant right hand. Drops food when scooping, Both hands for stabilizing drinks, and uses a straw. Unable to cut food. Goal status: Ongoing  4.  Pt. Will demonstrate independence with proper A/E techniques/compensatory strategies for ADL/IADLs.  Baseline: 11/09/23: Continue Eval: Education to be provided Goal status: Ongoing  5.  Pt. Will write a sentence with 75% legibility with modified  independence Baseline: 11/09/2023: 50% legibility for one sentence. Eval: 50% legibility for one printed sentence Goal status: Ongoing    ASSESSMENT:  CLINICAL IMPRESSION:  Pt.  presents with less tone, and tightness through the RUE, and hand today. Pt. Was bale to tolerate the initial UBE exercise without pain, or discomfort. Pt. Continues to respond well to manual therapy, and ROM. Pt. presents with increased ROM, and presented with increased tolerance for ROM today. Pt. requires consistent verbal cues, and cues for visual demonstration of proper technique and hand of lateral and 3pt. pinch position. Pt. continues to benefit from OT services to work on improving overall bilateral UE functioning in order to maximize engagement in, and efficiency in ADLs, and IADL tasks, and provide education about compensatory strategies.     PERFORMANCE DEFICITS: in functional skills including ADLs, IADLs, coordination, dexterity, proprioception, sensation, ROM, strength, pain, Fine motor control, Gross motor control, and UE functional use, cognitive skills including , and psychosocial skills including coping strategies, environmental adaptation, and routines and behaviors.   IMPAIRMENTS: are limiting patient from ADLs, IADLs, and leisure.   CO-MORBIDITIES: may have co-morbidities  that affects occupational performance. Patient will benefit from skilled OT to address above impairments and improve overall function.  MODIFICATION OR ASSISTANCE TO COMPLETE EVALUATION: Min-Moderate modification of tasks or assist with assess necessary to complete an evaluation.  OT OCCUPATIONAL PROFILE AND HISTORY: Detailed assessment: Review of records and additional review of physical, cognitive, psychosocial history related to current functional performance.  CLINICAL DECISION MAKING: Moderate - several treatment options, min-mod task modification necessary  REHAB POTENTIAL: Good  EVALUATION COMPLEXITY:  Moderate    PLAN:  OT FREQUENCY: 2x/week  OT DURATION: 12 weeks  PLANNED INTERVENTIONS: 97168 OT Re-evaluation, 97535 self care/ADL training, 60454 therapeutic exercise, 97530 therapeutic activity, 97112 neuromuscular re-education, 97140 manual therapy, 97018 paraffin, 09811 moist heat, 97034 contrast bath, 97760 Orthotics management and training, 91478 Splinting (initial encounter), energy conservation, patient/family education, and DME and/or AE instructions  RECOMMENDED OTHER SERVICES:   PT  CONSULTED AND AGREED WITH PLAN OF CARE: Patient  PLAN FOR  NEXT SESSION:   Treatment  Ambrose Wile, MS, OTR/L   11/30/2023, 5:35 PM

## 2023-11-30 NOTE — Therapy (Signed)
 OUTPATIENT PHYSICAL THERAPY NEURO TREATMENT   Patient Name: Daniel Daugherty MRN: [id number] DOB:31-Jan-1966, 58 y.o., male Today's Date: 11/30/2023   PCP: Tawnya Fava, Waddell Guess, MD  REFERRING PROVIDER: Tawnya Fava, Waddell Guess, MD   END OF SESSION:   PT End of Session - 11/30/23 0935     Visit Number 13    Number of Visits 24    Date for PT Re-Evaluation 12/21/23    Authorization Type Medicare 2025    PT Start Time 0935    PT Stop Time 1015    PT Time Calculation (min) 40 min    Equipment Utilized During Treatment Gait belt    Activity Tolerance Patient tolerated treatment well    Behavior During Therapy WFL for tasks assessed/performed                Past Medical History:  Diagnosis Date   Hypertension    Past Surgical History:  Procedure Laterality Date   DG HAND LEFT COMPLETE ([facility] HX) Left 04/13/1999   There are no active problems to display for this patient.   ONSET DATE: 2022 is when he noticed the change and that was around the same time he was diagnosed with Parkinson's  REFERRING DIAG:  G20.A1 (ICD-10-CM) - Parkinson disease (HCC)  Z74.1 (ICD-10-CM) - Requires daily assistance for activities of daily living (ADL) and comfort needs   THERAPY DIAG:  Muscle weakness (generalized)  Other lack of coordination  Abnormality of gait  Generalized weakness  Unsteadiness on feet  Rationale for Evaluation and Treatment: Rehabilitation  SUBJECTIVE:                                                                                                                                                                                             SUBJECTIVE STATEMENT:  Pt reports he has been doing good. Pt reports he is planning to go out to eat with his youngest son today and is looking forward to it. Pt reports he spent a whole day at the [facility] last week trying to get his Real ID. Pt reports "a little" pain in  R shoulder and neck, stating he did his stretches both OT  and PT gave him this morning prior to therapy.  Pt reports he got really good news yesterday that his son purchased him a cruise trip in July for his birthday!  Pt reports he has yet to call and get an appointment scheduled with the local neurologist because he wants one of his children to be able to go to the appointment with him.  Pt accompanied by: self  PERTINENT HISTORY: Parkinsonism, Vitamin  D deficiency, Hyperlipidemia, HTN, poor medication compliance (per chart review)  PAIN:  Are you having pain? Yes: NPRS scale: 5/10, but states "it ain't that bad" Pain location: R shoulder and low back Pain description: throbbing in shoulder Aggravating factors: overhead movements or reaching back Relieving factors: rest and at home uses a "massager"  PRECAUTIONS: Fall  RED FLAGS: None   WEIGHT BEARING RESTRICTIONS: No  FALLS: Has patient fallen in last 6 months? Yes. 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   PATIENT GOALS: Get my balance right with my walking, help get my R arm stronger and be able to move it more, improve strength in my legs (specifically the Right)  OBJECTIVE:  Note: Objective measures were completed at Evaluation unless otherwise noted.  DIAGNOSTIC FINDINGS: N/A  COGNITION: Overall cognitive status: Within functional limits for tasks assessed; however, did not assess higher level  cognitive tasks   SENSATION: WFL Light touch on screen Pt reports R foot falls asleep sitting on toilet and he has difficult time getting feeling back in it before feeling safe standing up.  COORDINATION: Overall bradykinesia with decreased speed and amplitude of movements  EDEMA:  None present  MUSCLE TONE: Not formally assessed  MUSCLE LENGTH: Not formally assessed, but may have some hamstring tightness on R associated with quad weakness and inability to achieve full knee extension in sitting  DTRs:  Not assessed  POSTURE: rounded shoulders, forward head, increased thoracic kyphosis, posterior pelvic tilt, and flexed trunk    LOWER EXTREMITY ROM:     Active ROM Right Eval Left Eval  Hip flexion Decreased in sitting compared to L   Hip extension    Hip abduction    Hip adduction    Hip internal rotation    Hip external rotation    Knee flexion    Knee extension Lacks full knee extension in sitting   Ankle dorsiflexion Limited in sitting Limited in sitting  Ankle plantarflexion    Ankle inversion    Ankle eversion     (Blank rows = not tested)  LOWER EXTREMITY MMT:    MMT Right Eval Left Eval  Hip flexion 3- 4+  Hip extension    Hip abduction    Hip adduction    Hip internal rotation    Hip external rotation    Knee flexion 3 4+  Knee extension 3- 4+  Ankle dorsiflexion 3- 4+  Ankle plantarflexion 3- 4+  Ankle inversion    Ankle eversion    (Blank rows = not tested)  Manual Muscle Test Scale 0/5 = No muscle contraction can be seen or felt 1/5 = Contraction can be felt, but there is no motion 2-/5 = Part moves through incomplete ROM w/ gravity decreased 2/5 = Part moves through complete ROM w/ gravity decreased 2+/5 = Part moves through incomplete ROM (<50%) against gravity or through complete ROM w/ gravity 3-/5 = Part moves through incomplete ROM (>50%) against gravity 3/5 =  Part moves through complete ROM against gravity 3+/5 = Part moves through  complete ROM against gravity/slight resistance 4-/5= Holds test position against slight to moderate pressure 4/5 = Part moves through complete ROM against gravity/moderate resistance 4+/5= Holds test position against moderate to strong pressure 5/5 = Part moves through complete ROM against gravity/full resistance  BED MOBILITY:  Sit to supine   Supine to sit   Need to assess  ADL: Reports dificulty getitng on R shoe, specifically having trouble pushing his foot down in his shoe  TRANSFERS: Assistive device utilized:  Hurry cane   Sit to stand: Modified independence and SBA Stand to sit: Modified independence and SBA Chair to chair: Modified independence and SBA Floor:  would benefit from testing in future  RAMP:  Level of Assistance:    Assistive device utilized:  Hurry cane Ramp Comments: would benefit from assessing  CURB:  Level of Assistance:    Assistive device utilized:  [employer] Comments: would benefit from assessing  STAIRS: Level of Assistance: CGA and Min A Stair Negotiation Technique: Step to Pattern Forwards with Bilateral Rails Number of Stairs: 4  Height of Stairs: 6in  Comments: leading with R LE in both directions, attempts reciprocal pattern on ascent with sufficient strength, but having uncontrolled anterior lean during descent  GAIT: Gait pattern: step through pattern, decreased arm swing- Right, decreased arm swing- Left, decreased step length- Right, decreased step length- Left, decreased stride length, decreased hip/knee flexion- Right, decreased hip/knee flexion- Left, decreased ankle dorsiflexion- Right, decreased ankle dorsiflexion- Left, decreased trunk rotation, trunk flexed, poor foot clearance- Right, and poor foot clearance- Left Distance walked: 140ft Assistive device utilized: None Level of assistance: CGA Comments: significantly decreased  FUNCTIONAL TESTS:  5 times sit to stand: 38.53 seconds arms across chest Timed up and go  (TUG): 19.46 seconds without AD 6 minute walk test: 10/04/2023: 573ft (115m at 0.29m/s avg) 10 meter walk test: 0.56 m/s without AD Mini-Best: 10/04/2023: 8/28  PATIENT SURVEYS:  ABC scale 17.5%  with pt feeling <40% confident on all of the items, pt feels only 30% confident walking around the house, 10% confident getting in/out of car, and 20% reaching for items  OCULOMOTOR EXAM from 11/16/2023 - performed due to Neurology note reporting concerns of decreased vertical gaze:  Ocular Alignment: normal  Ocular ROM: No Limitations  Spontaneous Nystagmus: absent  Gaze-Induced Nystagmus: absent  Smooth Pursuits: intact, good vertical movements with full range  Horizontal and Vertical Saccades: extra eye movements, slow, and frequently blinks, a few extra eye movements when moving to superior target                                                                                                                               TREATMENT DATE: 11/30/23  Unless otherwise stated, CGA was provided and gait belt donned in order to ensure pt safety throughout session.  Dynamic gait training ~547ft using boxing gloves to promote  increased arm swing, increased step length, and increased gait speed  via providing external targets - pt continues to have thoracic rounding with increased anterior trunk lean and decreased step lengths landing on his toes Improving gait speed, arm swing, and step lengths Cuing for pt to carryover these improvements when targets are removed  Dynamic gait training including ~50ft x3 backwards gait focused on reciprocal stepping pattern with sudden stops and transitions to re-starting forward gait  Close SBA/CGA for safety throughout to allow pt to utilize stepping strategies to recover balance as needed and pt successful without increased assist until becoming fatigued at the end  Forward/backwards stepping over 1/2 foam roll with 2 blaze pods placed on mirror behind patient set on  sequence to promote alternating trunk extension and rotation with shoulder external rotation and abduction with scapular retraction to reach the target each time he steps backwards 2 min x2 reps = 11 hits and 14 hits Requires CGA for safety with instability throughout, but no overt LOB Decreased stepping speed today compared to last time seen by this therapist  Side stepping over 1/2 foam roll with 2 Blaze Pods set on either side of pt behind him on a mirror to promote trunk extension and rotation with arm reach as just described 2 min = 20 hits Improved speed of lateral stepping although taking small step lengths over the target  Pt reporting some shoulder stretching discomfort with these interventions requiring him to reach, but requests to continue them without therapist modification.    PATIENT EDUCATION: Education details: PT POC, Goals, results of outcome measures indicating increased fall risk Person educated: Patient Education method: Explanation Education comprehension: verbalized understanding and needs further education  HOME EXERCISE PROGRAM: Access Code: [email or web address] URL: [email or web address] Date: 10/07/2023 Prepared by: Carlen Chasten  Exercises - Seated Forward Bending  - 1 x daily - 7 x weekly - 2 sets - 10 reps - Seated Reaching to Side and Across Body  - 1 x daily - 7 x weekly - 2 sets - 10 reps - Seated Side Bend Elbow on Knee with Opposite Arm Reach Overhead -  1 x daily - 7 x weekly - 2 sets - 10 reps  GOALS: Goals reviewed with patient? Yes  SHORT TERM GOALS: Target date: 11/09/2023    Patient will be independent in home exercise program to improve strength/mobility for better functional independence with ADLs. Baseline: need to initiate 10/07/2023: provided Goal status: IN PROGRESS   LONG TERM GOALS: Target date: 12/21/2023  1.  Patient (< 7 years old) will complete five times sit to stand test in < 12 seconds indicating an increased LE  strength and improved balance. Baseline: 09/28/23: 38.53 seconds 11/09/2023: 22.13 seconds with arms across chest Goal status: IN PROGRESS   2. Patient will reduce timed up and go to <11 seconds to reduce fall risk and demonstrate improved transfer/gait ability. Baseline: 09/28/23: 19.46 seconds without AD 11/09/2023: 16.96 seconds, no AD, with close SBA for balance safety Goal status: IN PROGRESS  3.  Patient will increase 10 meter walk test to >1.30m/s as to improve gait speed for better community ambulation and to reduce fall risk. Baseline: 09/28/23: 0.80m/s without AD 11/09/2023: 0.89 m/s (11.50 seconds seconds 10.94 seconds) no AD, with close SBA for safety Goal status: IN PROGRESS  4.  Patient will increase six minute walk test distance to >1039ft for progression to community ambulator and improve gait ability Baseline: 10/04/2023: 553ft (179m at 0.10m/s avg) 11/09/2023: 1061 feet (323 meters,  Avg speed 0.897 m/s) without AD Goal status: MET and Advanced 11/09/2023 Goal Updated: distance > 1329ft  5. Patient will increase MiniBest Test score to >18/28 to indicate a reduced risk for falling and demonstrate increased independence with functional mobility and ADLs.  Baseline: 10/04/2023: 8/28  11/16/2023: 16/28   Goal status: IN PROGRESS  6. Patient will increase ABC scale score >80% to demonstrate better functional mobility and better confidence with ADLs.   Baseline: 09/28/23: 17.5% 11/09/2023: 21.25% - pt reports he rated the test as is if he was not using his walker because this is his long term goal   Goal status: IN PROGRESS   ASSESSMENT:  CLINICAL IMPRESSION:   Patient arrived motivated to participate in therapy session. Therapy session focused on dynamic gait training at increased intensity with use of boxing gloves and external targets to promote increased arm swing, step lengths, and gait speed as well as backwards gait training with sudden stops with transition to fast forward gait.  Patient also participated in forward/backwards and lateral side stepping over 1/2 foam roll with reaching task to promote upright trunk posture and shoulder retraction with pt having decreased speed of forward/backwards stepping today and reports of tightness in his shoulders with this. Mr. Hagle will benefit from continued skilled PT to improve these deficits in order to increase QOL and ease/safety with ADLs and functional mobility as well as decrease risk for falls.   OBJECTIVE IMPAIRMENTS: Abnormal gait, decreased activity tolerance, decreased balance, decreased coordination, decreased endurance, decreased knowledge of condition, decreased knowledge of use of DME, decreased mobility, difficulty walking, decreased ROM, decreased strength, decreased safety awareness, improper body mechanics, postural dysfunction, and pain.   ACTIVITY LIMITATIONS: carrying, lifting, bending, standing, squatting, sleeping, stairs, transfers, bed mobility, continence, bathing, toileting, dressing, reach over head, hygiene/grooming, and locomotion level  PARTICIPATION LIMITATIONS: meal prep, cleaning, laundry, and community activity  PERSONAL FACTORS: Fitness and 3+ comorbidities: Vitamin D deficiency, Hyperlipidemia, and HTN  are also affecting patient's functional outcome.   REHAB POTENTIAL: Good  CLINICAL DECISION MAKING: Evolving/moderate complexity  EVALUATION COMPLEXITY: Moderate  PLAN:  PT FREQUENCY: 1-2x/week  PT DURATION: 12 weeks  PLANNED INTERVENTIONS: 97164- PT Re-evaluation, 97110-Therapeutic exercises, 97530- Therapeutic activity, 97112- Neuromuscular re-education, 97535- Self Care, 44010- Manual therapy, [phone]- Gait training, [phone]- Orthotic Fit/training, [phone]- Canalith repositioning, [phone]- Electrical stimulation (manual), Patient/Family education, Balance training, Stair training, Joint mobilization, Vestibular training, Visual/preceptual remediation/compensation, DME instructions, Cryotherapy,  and Moist heat  PLAN FOR NEXT SESSION:  - review and progress HEP   - add scapular retractions and gentle shoulder stretches (compare with OT HEP) - assess bed mobility - gait training with increased intensity - dynamic gait with focus on increased upright posture and speed of ambulation with arm swing (add AWs to UEs with boxing gloves)  - add dual-task challenges of head rotations, stepping over obstacles, sudden turns, and changes in gait speed - stepping in different directions   - with dual-task of Blaze Pods to promote increased speed/amplitude of movements  - continue arm reaching and trunk rotation (specifically to R) - standing balance: narrow BOS/SLS, on compliant surfaces, incline, eyes open&eyes closed - stepping strategy re-training: focus on posterior stepping - large amplitude UE movements with promotion of improved upright posture - B LE strengthening (R>L)  - progress to floor transfers    Carlen Chasten, PT, DPT, NCS, CSRS Physical Therapist - [facility]  [facility]  10:32 AM 11/30/23

## 2023-12-02 ENCOUNTER — Ambulatory Visit: Payer: 59 | Attending: Family Medicine | Admitting: Occupational Therapy

## 2023-12-02 ENCOUNTER — Ambulatory Visit: Payer: 59 | Admitting: Physical Therapy

## 2023-12-02 DIAGNOSIS — R2681 Unsteadiness on feet: Secondary | ICD-10-CM | POA: Insufficient documentation

## 2023-12-02 DIAGNOSIS — M6281 Muscle weakness (generalized): Secondary | ICD-10-CM

## 2023-12-02 DIAGNOSIS — R269 Unspecified abnormalities of gait and mobility: Secondary | ICD-10-CM | POA: Insufficient documentation

## 2023-12-02 DIAGNOSIS — R531 Weakness: Secondary | ICD-10-CM

## 2023-12-02 DIAGNOSIS — R278 Other lack of coordination: Secondary | ICD-10-CM

## 2023-12-02 DIAGNOSIS — R262 Difficulty in walking, not elsewhere classified: Secondary | ICD-10-CM | POA: Diagnosis present

## 2023-12-02 NOTE — Therapy (Signed)
 OUTPATIENT PHYSICAL THERAPY NEURO TREATMENT   Patient Name: Daniel Daugherty MRN: [id number] DOB:06-19-1966, 58 y.o., male Today's Date: 12/02/2023   PCP: Tawnya Fava, Waddell Guess, MD  REFERRING PROVIDER: Tawnya Fava, Waddell Guess, MD   END OF SESSION:   PT End of Session - 12/02/23 0930     Visit Number 14    Number of Visits 24    Date for PT Re-Evaluation 12/21/23    Authorization Type Medicare 2025    PT Start Time 0930    PT Stop Time 1015    PT Time Calculation (min) 45 min    Equipment Utilized During Treatment Gait belt    Activity Tolerance Patient tolerated treatment well    Behavior During Therapy WFL for tasks assessed/performed                 Past Medical History:  Diagnosis Date   Hypertension    Past Surgical History:  Procedure Laterality Date   DG HAND LEFT COMPLETE ([facility] HX) Left 04/13/1999   There are no active problems to display for this patient.   ONSET DATE: 2022 is when he noticed the change and that was around the same time he was diagnosed with Parkinson's  REFERRING DIAG:  G20.A1 (ICD-10-CM) - Parkinson disease (HCC)  Z74.1 (ICD-10-CM) - Requires daily assistance for activities of daily living (ADL) and comfort needs   THERAPY DIAG:  Muscle weakness (generalized)  Other lack of coordination  Abnormality of gait  Generalized weakness  Unsteadiness on feet  Rationale for Evaluation and Treatment: Rehabilitation  SUBJECTIVE:                                                                                                                                                                                             SUBJECTIVE STATEMENT:  Pt reports he had a good lunch with his son the other day after therapy, but he needs a haircut. Pt reports continuing to have "soreness" pain in R neck and shoulder. Pt reports he has an Theme park manager he uses at night on his R neck/shoulder area to help manage the pain before going to bed.  Pt  reports he has yet to call and get an appointment scheduled with the local neurologist, but plans to do it tomorrow.  Denies falls since last visit.  Pt accompanied by: self  PERTINENT HISTORY: Parkinsonism, Vitamin D deficiency, Hyperlipidemia, HTN, poor medication compliance (per chart review)  PAIN:  Are you having pain? Yes: NPRS scale: 5/10, but states "it ain't that bad" Pain location: R shoulder and low back Pain description: throbbing in shoulder Aggravating factors: overhead  movements or reaching back Relieving factors: rest and at home uses a "massager"  PRECAUTIONS: Fall  RED FLAGS: None   WEIGHT BEARING RESTRICTIONS: No  FALLS: Has patient fallen in last 6 months? Yes. 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   PATIENT GOALS: Get my balance right with my walking, help get my R arm stronger and be able to move it more, improve strength in my legs (specifically the Right)  OBJECTIVE:  Note: Objective measures were completed at Evaluation unless otherwise noted.  DIAGNOSTIC FINDINGS: N/A  COGNITION: Overall cognitive status: Within functional limits for tasks assessed; however, did not assess higher level cognitive tasks   SENSATION: WFL Light touch on screen Pt reports R foot falls asleep sitting on toilet and he has difficult time getting feeling back in it before feeling safe standing  up.  COORDINATION: Overall bradykinesia with decreased speed and amplitude of movements  EDEMA:  None present  MUSCLE TONE: Not formally assessed  MUSCLE LENGTH: Not formally assessed, but may have some hamstring tightness on R associated with quad weakness and inability to achieve full knee extension in sitting  DTRs:  Not assessed  POSTURE: rounded shoulders, forward head, increased thoracic kyphosis, posterior pelvic tilt, and flexed trunk    LOWER EXTREMITY ROM:     Active ROM Right Eval Left Eval  Hip flexion Decreased in sitting compared to L   Hip extension    Hip abduction    Hip adduction    Hip internal rotation    Hip external rotation    Knee flexion    Knee extension Lacks full knee extension in sitting   Ankle dorsiflexion Limited in sitting Limited in sitting  Ankle plantarflexion    Ankle inversion    Ankle eversion     (Blank rows = not tested)  LOWER EXTREMITY MMT:    MMT Right Eval Left Eval  Hip flexion 3- 4+  Hip extension    Hip abduction    Hip adduction    Hip internal rotation    Hip external rotation    Knee flexion 3 4+  Knee extension 3- 4+  Ankle dorsiflexion 3- 4+  Ankle plantarflexion 3- 4+  Ankle inversion    Ankle eversion    (Blank rows = not tested)  Manual Muscle Test Scale 0/5 = No muscle contraction can be seen or felt 1/5 = Contraction can be felt, but there is no motion 2-/5 = Part moves through incomplete ROM w/ gravity decreased 2/5 = Part moves through complete ROM w/ gravity decreased 2+/5 = Part moves through incomplete ROM (<50%) against gravity or through complete ROM w/ gravity 3-/5 = Part moves through incomplete ROM (>50%) against gravity 3/5 = Part moves through complete ROM against gravity 3+/5 = Part moves through complete ROM against gravity/slight resistance 4-/5= Holds test position against slight to moderate pressure 4/5 = Part moves through complete ROM against gravity/moderate  resistance 4+/5= Holds test position against  moderate to strong pressure 5/5 = Part moves through complete ROM against gravity/full resistance  BED MOBILITY:  Sit to supine   Supine to sit   Need to assess  ADL: Reports dificulty getitng on R shoe, specifically having trouble pushing his foot down in his shoe  TRANSFERS: Assistive device utilized:  Hurry cane   Sit to stand: Modified independence and SBA Stand to sit: Modified independence and SBA Chair to chair: Modified independence and SBA Floor:  would benefit from testing in future  RAMP:  Level of Assistance:    Assistive device utilized:  Hurry cane Ramp Comments: would benefit from assessing  CURB:  Level of Assistance:    Assistive device utilized:  [employer] Comments: would benefit from assessing  STAIRS: Level of Assistance: CGA and Min A Stair Negotiation Technique: Step to Pattern Forwards with Bilateral Rails Number of Stairs: 4  Height of Stairs: 6in  Comments: leading with R LE in both directions, attempts reciprocal pattern on ascent with sufficient strength, but having uncontrolled anterior lean during descent  GAIT: Gait pattern: step through pattern, decreased arm swing- Right, decreased arm swing- Left, decreased step length- Right, decreased step length- Left, decreased stride length, decreased hip/knee flexion- Right, decreased hip/knee flexion- Left, decreased ankle dorsiflexion- Right, decreased ankle dorsiflexion- Left, decreased trunk rotation, trunk flexed, poor foot clearance- Right, and poor foot clearance- Left Distance walked: 152ft Assistive device utilized: None Level of assistance: CGA Comments: significantly decreased  FUNCTIONAL TESTS:  5 times sit to stand: 38.53 seconds arms across chest Timed up and go (TUG): 19.46 seconds without AD 6 minute walk test: 10/04/2023: 528ft (175m at 0.45m/s avg) 10 meter walk test: 0.56 m/s without AD Mini-Best: 10/04/2023: 8/28  PATIENT SURVEYS:   ABC scale 17.5%  with pt feeling <40% confident on all of the items, pt feels only 30% confident walking around the house, 10% confident getting in/out of car, and 20% reaching for items  OCULOMOTOR EXAM from 11/16/2023 - performed due to Neurology note reporting concerns of decreased vertical gaze:  Ocular Alignment: normal  Ocular ROM: No Limitations  Spontaneous Nystagmus: absent  Gaze-Induced Nystagmus: absent  Smooth Pursuits: intact, good vertical movements with full range  Horizontal and Vertical Saccades: extra eye movements, slow, and frequently blinks, a few extra eye movements when moving to superior target                                                                                                                               TREATMENT DATE: 12/02/23  Unless otherwise stated, CGA was provided and gait belt donned in order to ensure pt safety throughout session.  Therapy session focused on updating pt's HEP as pt reporting some shoulder discomfort with previously prescribed exercises.  Specifically, reports having some R shoulder pain when trying to perform seated arm/trunk rotation while trying to hold up his R arm the entire time.  Performed 1 set of each of the  below exercises to ensure pt knows proper form/technique to complete at home. Therapist educated pt on safe set-up of exercises at home. Transitioned focus of HEP to more gentle shoulder/pec stretches as well as functional strengthening. Pt tolerated exercises well.   Exercises - Sidelying Thoracic Rotation with Open Book  - 1 x daily - 7 x weekly - 2 sets - 10 reps - Supine Shoulder External Rotation in Abduction  - 1 x daily - 7 x weekly - 2 sets - 10 reps - 10 seconds hold - Seated Scapular Retraction  - 1 x daily - 7 x weekly - 3 sets - 10 reps - 3 seconds hold - Sit to Stand with Arm Swing  - 1 x daily - 7 x weekly - 2 sets - 10 reps  Supine<>sit on mat during session with pt requiring increased time and effort  to perform this transitional movement, but successfully performs mod-I when given time.  Dynamic gait training ~160ft with pt demonstrating significant thoracic rounding, shuffled feet landing on toes, lack of arm swing, and decreased gait speed - attempted verbal cuing for improvements with minimal change in mechanics.  Dynamic gait training ~65ft down/back x6 reps through obstacle course including agility ladder and 4 obstacles (1/2 foam rolls) with goal of improving reciprocal stepping pattern with increased foot clearance, step length,and heel strike - followed by pt ambulating additional ~61ft at the end of obstacle course to promote carryover of improved gait mechancis Cuing for pt to carryover these improvements when targets are removed, but minimally sustained ability    PATIENT EDUCATION: Education details: PT POC, Goals, results of outcome measures indicating increased fall risk Person educated: Patient Education method: Explanation Education comprehension: verbalized understanding and needs further education  HOME EXERCISE PROGRAM:  Access Code: [email or web address] URL: [email or web address] Date: 12/02/2023 Prepared by: Carlen Chasten  Exercises - Sidelying Thoracic Rotation with Open Book  - 1 x daily - 7 x weekly - 2 sets - 10 reps - Supine Shoulder External Rotation in Abduction  - 1 x daily - 7 x weekly - 2 sets - 10 reps - 10 seconds hold - Seated Scapular Retraction  - 1 x daily - 7 x weekly - 3 sets - 10 reps - 3 seconds hold - Sit to Stand with Arm Swing  - 1 x daily - 7 x weekly - 2 sets - 10 reps   GOALS: Goals reviewed with patient? Yes  SHORT TERM GOALS: Target date: 11/09/2023    Patient will be independent in home exercise program to improve strength/mobility for better functional independence with ADLs. Baseline: need to initiate 10/07/2023: provided Goal status: IN PROGRESS   LONG TERM GOALS: Target date: 12/21/2023  1.  Patient (< 9 years old) will  complete five times sit to stand test in < 12 seconds indicating an increased LE strength and improved balance. Baseline: 09/28/23: 38.53 seconds 11/09/2023: 22.13 seconds with arms across chest Goal status: IN PROGRESS   2. Patient will reduce timed up and go to <11 seconds to reduce fall risk and demonstrate improved transfer/gait ability. Baseline: 09/28/23: 19.46 seconds without AD 11/09/2023: 16.96 seconds, no AD, with close SBA for balance safety Goal status: IN PROGRESS  3.  Patient will increase 10 meter walk test to >1.45m/s as to improve gait speed for better community ambulation and to reduce fall risk. Baseline: 09/28/23: 0.2m/s without AD 11/09/2023: 0.89 m/s (11.50 seconds seconds 10.94 seconds) no AD, with close SBA for safety Goal status: IN PROGRESS  4.  Patient will increase six minute walk test distance to >1072ft for progression to community ambulator and improve gait ability Baseline: 10/04/2023: 536ft (147m at 0.65m/s avg) 11/09/2023: 1061 feet (323 meters, Avg speed 0.897 m/s) without AD Goal status: MET and Advanced 11/09/2023 Goal Updated: distance > 1373ft  5. Patient will increase MiniBest Test score to >18/28 to indicate a reduced risk for falling and demonstrate increased independence with functional mobility and ADLs.  Baseline: 10/04/2023: 8/28  11/16/2023: 16/28   Goal status: IN PROGRESS  6. Patient will increase ABC scale score >80% to demonstrate better functional mobility and better confidence with ADLs.   Baseline: 09/28/23: 17.5% 11/09/2023: 21.25% - pt reports he rated the test as is if he was not using his walker because this is his long term goal   Goal status: IN PROGRESS   ASSESSMENT:  CLINICAL IMPRESSION:   Patient arrived motivated to participate in therapy session. Therapy session focused on updating pt's HEP to provide more targeted gentle UE stretching due to pt reporting R shoulder pain with seated exercises. Therapist also added sit>stand functional  strengthening exercise on HEP. Pt completed 1 set of each exercise during session demonstrating proper form/technique and tolerated well. Therapy session also focused on dynamic gait training with use of external targets to promote increased foot clearance, step length, and heel strike during gait with pt responding best to the larger obstacles (1/2 foam roll). Mr. Septer will benefit from continued skilled PT to improve these deficits in order to increase QOL and ease/safety with ADLs and functional mobility as well as decrease risk for falls.   OBJECTIVE IMPAIRMENTS: Abnormal gait, decreased activity tolerance, decreased balance, decreased coordination, decreased endurance, decreased knowledge of condition, decreased knowledge of use of DME, decreased mobility, difficulty walking, decreased ROM, decreased strength, decreased safety awareness, improper body mechanics, postural dysfunction, and pain.   ACTIVITY LIMITATIONS: carrying, lifting, bending, standing, squatting, sleeping, stairs, transfers, bed mobility, continence, bathing, toileting, dressing, reach over head, hygiene/grooming, and locomotion level  PARTICIPATION LIMITATIONS: meal prep, cleaning, laundry, and community activity  PERSONAL FACTORS: Fitness and 3+ comorbidities: Vitamin D deficiency, Hyperlipidemia, and HTN  are also affecting patient's functional outcome.   REHAB POTENTIAL: Good  CLINICAL DECISION MAKING: Evolving/moderate complexity  EVALUATION COMPLEXITY: Moderate  PLAN:  PT FREQUENCY: 1-2x/week  PT DURATION: 12 weeks  PLANNED INTERVENTIONS: 97164- PT Re-evaluation, 97110-Therapeutic exercises, 97530- Therapeutic activity, 97112- Neuromuscular re-education, 97535- Self Care, 69629- Manual therapy, [phone]- Gait training, [phone]- Orthotic Fit/training, [phone]- Canalith repositioning, [phone]- Electrical stimulation (manual), Patient/Family education, Balance training, Stair training, Joint mobilization, Vestibular training,  Visual/preceptual remediation/compensation, DME instructions, Cryotherapy, and Moist heat  PLAN FOR NEXT SESSION:  - review updated HEP  - gait training with increased intensity - dynamic gait with focus on increased upright posture and speed of ambulation with arm swing (add AWs to UEs with boxing gloves)  - add dual-task challenges of head rotations, stepping over obstacles, sudden turns, and changes in gait speed - stepping in different directions   - with dual-task of Blaze Pods to promote increased speed/amplitude of movements  - continue arm reaching and trunk rotation (specifically to R) - standing balance: narrow BOS/SLS, on compliant surfaces, incline, eyes open&eyes closed - stepping strategy re-training: focus on posterior stepping - large amplitude UE movements with promotion of improved upright posture - B LE strengthening (R>L)  - progress to floor transfers    Carlen Chasten, PT, DPT, NCS, CSRS Physical Therapist - [facility]  [facility]  12:56 PM 12/02/23

## 2023-12-02 NOTE — Therapy (Signed)
 Occupational TherapyNeuro Treatment Note  Patient Name: Daniel Daugherty MRN: [id number] DOB:04-05-1966, 58 y.o., male Today's Date: 12/02/2023  PCP: N/A REFERRING PROVIDER: Arlen Belton, MD  END OF SESSION:  OT End of Session - 12/02/23 1604     Visit Number 15    Number of Visits 24    Date for OT Re-Evaluation 12/21/23    OT Start Time 1015    OT Stop Time 1100    OT Time Calculation (min) 45 min    Activity Tolerance Patient tolerated treatment well    Behavior During Therapy WFL for tasks assessed/performed                 Past Medical History:  Diagnosis Date   Hypertension    Past Surgical History:  Procedure Laterality Date   DG HAND LEFT COMPLETE ([facility] HX) Left 04/13/1999   There are no active problems to display for this patient.   ONSET DATE: 05/2021  REFERRING DIAG: Parkinson's Disease  THERAPY DIAG:  Muscle weakness (generalized)  Rationale for Evaluation and Treatment: Rehabilitation  SUBJECTIVE:   SUBJECTIVE STATEMENT:  Pt. Reports that he  is looking forward to his cruise in July Pt accompanied by: self  PERTINENT HISTORY:   Pt. is a 58 y.o. male who was diagnosed with Parkinson's Disease in 2022. Pt. reports having had a progressive decline in function since having hernia surgery, and right shoulder rotator cuff surgery. PMHx included: HTN. Of note; Pt. was being followed by [facility], and receiving Home health therapy services. Pt. changed physicians, and was referred for outpatient rehab services here.  PRECAUTIONS: None  WEIGHT BEARING RESTRICTIONS: No  PAIN:  Are you having pain? 0/10  FALLS: Has patient fallen in last 6 months? No, 1 small slip near the bed tripped over shoes  LIVING ENVIRONMENT: Lives with: lives alone Lives in: [place],  Stairs: Entry level Has following equipment at home: Quad cane small base, rollator, shower chair, grab bars, hand rail at [employer]  PLOF: Independent  PATIENT GOALS: Improve  self-care  OBJECTIVE:  Note: Objective measures were completed at Evaluation unless otherwise noted.  HAND DOMINANCE: Right  ADLs:  Assist at home:  Ex wife, and children assist several times a week  Transfers/ambulation related to ADLs: Modified Independent Eating: Difficulty using dominant right hand. Drops food when scooping, Both hands for stabilizing drinks, and uses a straw. Unable to cut food.  Grooming: Increased time required for all grooming tasks. UB Dressing: Difficulty managing buttons LB Dressing: Less assist with pants with elastic, more assist with dress pants Toileting: Increased time for hygiene. Bathing: Requires increased time to complete Tub Shower transfers: Modified Independence with increased time  IADLs: Shopping: Son assists with grocery shopping Light housekeeping: Son's assist. Pt. Does light rising off of dishes. Meal Prep: Increased time. Independent with microwave, and air fryer. Difficulty opening bottles, containers Community mobility:  Transportation, light driving to grocery store Medication management:  Son's set-up weekly pillbox, Pt. Is responsible for taking the medications Financial management: Son's assist with monthly finances Handwriting: 50% legible Hobbies: Dancing, watching sports Career/work: Restaurant manager, fast food  MOBILITY STATUS: Uses a cane; shuffling gait  FUNCTIONAL OUTCOME MEASURES:  TBD  UPPER EXTREMITY ROM:    Active ROM Right eval Right 11/09/23 Left Eval [facility] overall  Shoulder flexion WFL 120(134)   Shoulder abduction 52(100) 60(110)   Shoulder adduction     Shoulder extension     Shoulder internal rotation     Shoulder external rotation  Elbow flexion [facility] WFL   Elbow extension [facility]   Wrist flexion     Wrist extension [facility]   Wrist ulnar deviation     Wrist radial deviation     Wrist pronation     Wrist supination     (Blank rows = not tested)  UPPER EXTREMITY MMT:     MMT Right eval  Right 11/09/23 Left Eval 4-/5 overall Left 11/09/23 4/5 Overall  Shoulder flexion 3/5 3-/5    Shoulder abduction 2/5 2/5    Shoulder adduction      Shoulder extension      Shoulder internal rotation      Shoulder external rotation      Middle trapezius      Lower trapezius      Elbow flexion 3/5 3+/5    Elbow extension 3/5 3+5    Wrist flexion      Wrist extension 3/5 3/5    Wrist ulnar deviation      Wrist radial deviation      Wrist pronation      Wrist supination      (Blank rows = not tested)  HAND FUNCTION:   Eval: Grip strength: Right: 50 lbs; Left: 34 lbs, Lateral pinch: Right: 17 lbs, Left: 9 lbs, 3 point pinch: Right: 12 lbs, Left: 11 lbs  11/09/23: Grip strength: Right: 60 lbs; Left: 35 lbs, Lateral pinch: Right: 19 lbs, Left: 11 lbs, 3 point pinch: Right: 13 lbs, Left: 9 lbs  COORDINATION:  Eval: 9 Hole Peg test: Right: 1 min. & 46 sec; Left: 53 sec  11/09/2023: 9 Hole Peg test: Right: 1 min. & 22 sec; Left: 46 sec   SENSATION:  Light touch: WFL Proprioception: WFL  EDEMA: N/A  COGNITION: Overall cognitive status: Within functional limits for tasks assessed  VISION:  Does not wear glasses.  VISION ASSESSMENT: To be further assessed in functional context  PERCEPTION: WFL  PRAXIS: Impaired: Motor planning                                                                                                                         TREATMENT DATE: 11/30/23  Therapeutic Ex:   -A/AAROM for the RUE through all joint ranges of motion following manual therapy. -BUE strengthening, and reciprocal motion using the UBE while seated for 8 min. with minimal resistance. -Lateral, and 3pt. pinch strengthening using yellow, red, green, and blue, and black level resistive clips    Manual Therapy:   -Soft tissue massage was performed to the scapular, and UE musculature 2/2 tightness -Pt. tolerated scapular mobilization for elevation, depression, and abduction/rotation  in sitting to normalize tone, decrease tightness, and prepare the RUE ROM, strengthening, and functional use..   -Manual therapy was performed independent of, and in preparation for therapeutic Ex.        PATIENT EDUCATION: Education details: UE ROM, ADL functional status Person educated: Patient Education method: Explanation, Demonstration, Tactile cues, and Verbal cues Education comprehension:  needs further education  HOME EXERCISE PROGRAM:  Continue to assess, and provide as indicated.   GOALS: Goals reviewed with patient? Yes  SHORT TERM GOALS: Target date: 11/09/2023    Pt. Will be independent with HEPs for RUE. Baseline: 11/09/2023: Independent Eval: No current HEP Goal status:Ongoing  LONG TERM GOALS: Target date: 12/21/2023  1.  Pt. Will increase right shoulder abduction by 10 degrees to assist with UE dressing Baseline: 11/09/23: Right 60/(110) Eval: Right 52(100) Goal status: Ongoing  2.  Pt. Will increase BUE strength by 2 mm grades to assist with ADLs. Baseline: 11/09/23: Right: shoulder flexion: 3-/5, abduction: 2/5, elbow flexion/extension: 3+/5, wrist extension: 3/5. Left: 4/5 overallEval: Right: shoulder flexion: 3/5, abduction: 2/5, elbow flexion/extension: 3/5, wrist extension: 3/5. Left: 4-/5 overall Goal status: Ongoing  3.  Pt. Will perform self-feeding with modified independence Baseline: 11/09/23: Pt. Continues to have difficulty using dominant right hand. Drops food when scooping, Both hands for stabilizing drinks, and uses a straw. Unable to cut food. Eval: Difficulty using dominant right hand. Drops food when scooping, Both hands for stabilizing drinks, and uses a straw. Unable to cut food. Goal status: Ongoing  4.  Pt. Will demonstrate independence with proper A/E techniques/compensatory strategies for ADL/IADLs.  Baseline: 11/09/23: Continue Eval: Education to be provided Goal status: Ongoing  5.  Pt. Will write a sentence with 75% legibility with  modified independence Baseline: 11/09/2023: 50% legibility for one sentence. Eval: 50% legibility for one printed sentence Goal status: Ongoing    ASSESSMENT:  CLINICAL IMPRESSION:  Pt.  presents with less tone, and tightness through the RUE, and hand today. Pt. was able to tolerate manual therapy, stretches, and strengthening without pain, or discomfort. Pt. was able to tolerate increased resistance on the UBE this morning. Pt. presents with increased ROM, and presented with increased tolerance for ROM today. Pt. requires consistent verbal cues, and cues for visual demonstration of proper technique as well as lateral and 3pt. pinch position. Pt. continues to benefit from OT services to work on improving overall bilateral UE functioning in order to maximize engagement in, and efficiency in ADLs, and IADL tasks, and provide education about compensatory strategies.     PERFORMANCE DEFICITS: in functional skills including ADLs, IADLs, coordination, dexterity, proprioception, sensation, ROM, strength, pain, Fine motor control, Gross motor control, and UE functional use, cognitive skills including , and psychosocial skills including coping strategies, environmental adaptation, and routines and behaviors.   IMPAIRMENTS: are limiting patient from ADLs, IADLs, and leisure.   CO-MORBIDITIES: may have co-morbidities  that affects occupational performance. Patient will benefit from skilled OT to address above impairments and improve overall function.  MODIFICATION OR ASSISTANCE TO COMPLETE EVALUATION: Min-Moderate modification of tasks or assist with assess necessary to complete an evaluation.  OT OCCUPATIONAL PROFILE AND HISTORY: Detailed assessment: Review of records and additional review of physical, cognitive, psychosocial history related to current functional performance.  CLINICAL DECISION MAKING: Moderate - several treatment options, min-mod task modification necessary  REHAB POTENTIAL:  Good  EVALUATION COMPLEXITY: Moderate    PLAN:  OT FREQUENCY: 2x/week  OT DURATION: 12 weeks  PLANNED INTERVENTIONS: 97168 OT Re-evaluation, 97535 self care/ADL training, 91478 therapeutic exercise, 97530 therapeutic activity, 97112 neuromuscular re-education, 97140 manual therapy, 97018 paraffin, 29562 moist heat, 97034 contrast bath, 97760 Orthotics management and training, 13086 Splinting (initial encounter), energy conservation, patient/family education, and DME and/or AE instructions  RECOMMENDED OTHER SERVICES:   PT  CONSULTED AND AGREED WITH PLAN OF CARE: Patient  PLAN FOR  NEXT SESSION:   Treatment  Kaiyla Stahly, MS, OTR/L   12/02/2023, 4:16 PM

## 2023-12-07 ENCOUNTER — Ambulatory Visit: Payer: 59

## 2023-12-07 ENCOUNTER — Ambulatory Visit: Payer: 59 | Admitting: Occupational Therapy

## 2023-12-07 DIAGNOSIS — M6281 Muscle weakness (generalized): Secondary | ICD-10-CM | POA: Diagnosis not present

## 2023-12-07 DIAGNOSIS — R262 Difficulty in walking, not elsewhere classified: Secondary | ICD-10-CM

## 2023-12-07 DIAGNOSIS — R278 Other lack of coordination: Secondary | ICD-10-CM

## 2023-12-07 DIAGNOSIS — R2681 Unsteadiness on feet: Secondary | ICD-10-CM

## 2023-12-07 NOTE — Therapy (Signed)
 Occupational TherapyNeuro Treatment Note  Patient Name: Daniel Daugherty MRN: [id number] DOB:[birth year], 58 y.o., male Today's Date: 12/07/2023  PCP: N/A REFERRING PROVIDER: Arlen Belton, MD  END OF SESSION:  OT End of Session - 12/07/23 1153     Visit Number 16    Date for OT Re-Evaluation 12/21/23    OT Start Time 1015    OT Stop Time 1100    OT Time Calculation (min) 45 min    Activity Tolerance Patient tolerated treatment well    Behavior During Therapy WFL for tasks assessed/performed                 Past Medical History:  Diagnosis Date   Hypertension    Past Surgical History:  Procedure Laterality Date   DG HAND LEFT COMPLETE ([facility] HX) Left 04/13/1999   There are no active problems to display for this patient.   ONSET DATE: 05/2021  REFERRING DIAG: Parkinson's Disease  THERAPY DIAG:  Muscle weakness (generalized)  Rationale for Evaluation and Treatment: Rehabilitation  SUBJECTIVE:   SUBJECTIVE STATEMENT:  Pt. reports that they will cookout for Mother's Day this weekend. Pt accompanied by: self  PERTINENT HISTORY:   Pt. is a 58 y.o. male who was diagnosed with Parkinson's Disease in 2022. Pt. reports having had a progressive decline in function since having hernia surgery, and right shoulder rotator cuff surgery. PMHx included: HTN. Of note; Pt. was being followed by [facility], and receiving Home health therapy services. Pt. changed physicians, and was referred for outpatient rehab services here.  PRECAUTIONS: None  WEIGHT BEARING RESTRICTIONS: No  PAIN:  Are you having pain? 0/10  FALLS: Has patient fallen in last 6 months? No, 1 small slip near the bed tripped over shoes  LIVING ENVIRONMENT: Lives with: lives alone Lives in: [place],  Stairs: Entry level Has following equipment at home: Quad cane small base, rollator, shower chair, grab bars, hand rail at [employer]  PLOF: Independent  PATIENT GOALS: Improve self-care  OBJECTIVE:   Note: Objective measures were completed at Evaluation unless otherwise noted.  HAND DOMINANCE: Right  ADLs:  Assist at home:  Ex wife, and children assist several times a week  Transfers/ambulation related to ADLs: Modified Independent Eating: Difficulty using dominant right hand. Drops food when scooping, Both hands for stabilizing drinks, and uses a straw. Unable to cut food.  Grooming: Increased time required for all grooming tasks. UB Dressing: Difficulty managing buttons LB Dressing: Less assist with pants with elastic, more assist with dress pants Toileting: Increased time for hygiene. Bathing: Requires increased time to complete Tub Shower transfers: Modified Independence with increased time  IADLs: Shopping: Son assists with grocery shopping Light housekeeping: Son's assist. Pt. Does light rising off of dishes. Meal Prep: Increased time. Independent with microwave, and air fryer. Difficulty opening bottles, containers Community mobility:  Transportation, light driving to grocery store Medication management:  Son's set-up weekly pillbox, Pt. Is responsible for taking the medications Financial management: Son's assist with monthly finances Handwriting: 50% legible Hobbies: Dancing, watching sports Career/work: Restaurant manager, fast food  MOBILITY STATUS: Uses a cane; shuffling gait  FUNCTIONAL OUTCOME MEASURES:  TBD  UPPER EXTREMITY ROM:    Active ROM Right eval Right 11/09/23 Left Eval [facility] overall  Shoulder flexion WFL 120(134)   Shoulder abduction 52(100) 60(110)   Shoulder adduction     Shoulder extension     Shoulder internal rotation     Shoulder external rotation     Elbow flexion [facility]  Elbow extension [facility]   Wrist flexion     Wrist extension [facility]   Wrist ulnar deviation     Wrist radial deviation     Wrist pronation     Wrist supination     (Blank rows = not tested)  UPPER EXTREMITY MMT:     MMT Right eval Right 11/09/23  Left Eval 4-/5 overall Left 11/09/23 4/5 Overall  Shoulder flexion 3/5 3-/5    Shoulder abduction 2/5 2/5    Shoulder adduction      Shoulder extension      Shoulder internal rotation      Shoulder external rotation      Middle trapezius      Lower trapezius      Elbow flexion 3/5 3+/5    Elbow extension 3/5 3+5    Wrist flexion      Wrist extension 3/5 3/5    Wrist ulnar deviation      Wrist radial deviation      Wrist pronation      Wrist supination      (Blank rows = not tested)  HAND FUNCTION:   Eval: Grip strength: Right: 50 lbs; Left: 34 lbs, Lateral pinch: Right: 17 lbs, Left: 9 lbs, 3 point pinch: Right: 12 lbs, Left: 11 lbs  11/09/23: Grip strength: Right: 60 lbs; Left: 35 lbs, Lateral pinch: Right: 19 lbs, Left: 11 lbs, 3 point pinch: Right: 13 lbs, Left: 9 lbs  COORDINATION:  Eval: 9 Hole Peg test: Right: 1 min. & 46 sec; Left: 53 sec  11/09/2023: 9 Hole Peg test: Right: 1 min. & 22 sec; Left: 46 sec   SENSATION:  Light touch: WFL Proprioception: WFL  EDEMA: N/A  COGNITION: Overall cognitive status: Within functional limits for tasks assessed  VISION:  Does not wear glasses.  VISION ASSESSMENT: To be further assessed in functional context  PERCEPTION: WFL  PRAXIS: Impaired: Motor planning                                                                                                                         TREATMENT DATE: 12/07/23  Therapeutic Ex:   -A/AAROM for the RUE through all joint ranges of motion following manual therapy. -UE strengthening through reciprocal motion using the SciFit for 8 min. beginning with no resistance, and progressively increasing it to 2.0, and finally to level 4.0 with constant monitoring of the BUEs. Pt. worked on changing, and alternating forward reverse position every 2 min. Rest breaks were required.    Therapeutic Activities:  -Left lateral, and 3pt. pinch strengthening using yellow, red, green, and blue, and  black level resistive clips  -Combined translatory movements moving clips from the lateral pinch position to the 3pt. Pinch position in preparation for securely placing them on the dowel.  -Combined movements Incorporating reaching into multiple planes to further challenge the task.   Manual Therapy:   -Soft tissue massage was performed to the scapular, and UE musculature 2/2  tightness -Pt. tolerated scapular mobilization for elevation, depression, and abduction/rotation in sitting to normalize tone, decrease tightness, and prepare the RUE ROM, strengthening, and functional use.   -Manual therapy was performed independent of, and in preparation for therapeutic Ex.    PATIENT EDUCATION: Education details: UE ROM, ADL functional status Person educated: Patient Education method: Explanation, Demonstration, Tactile cues, and Verbal cues Education comprehension: needs further education  HOME EXERCISE PROGRAM:  Continue to assess, and provide as indicated.   GOALS: Goals reviewed with patient? Yes  SHORT TERM GOALS: Target date: 11/09/2023    Pt. Will be independent with HEPs for RUE. Baseline: 11/09/2023: Independent Eval: No current HEP Goal status:Ongoing  LONG TERM GOALS: Target date: 12/21/2023  1.  Pt. Will increase right shoulder abduction by 10 degrees to assist with UE dressing Baseline: 11/09/23: Right 60/(110) Eval: Right 52(100) Goal status: Ongoing  2.  Pt. Will increase BUE strength by 2 mm grades to assist with ADLs. Baseline: 11/09/23: Right: shoulder flexion: 3-/5, abduction: 2/5, elbow flexion/extension: 3+/5, wrist extension: 3/5. Left: 4/5 overallEval: Right: shoulder flexion: 3/5, abduction: 2/5, elbow flexion/extension: 3/5, wrist extension: 3/5. Left: 4-/5 overall Goal status: Ongoing  3.  Pt. Will perform self-feeding with modified independence Baseline: 11/09/23: Pt. Continues to have difficulty using dominant right hand. Drops food when scooping, Both hands for  stabilizing drinks, and uses a straw. Unable to cut food. Eval: Difficulty using dominant right hand. Drops food when scooping, Both hands for stabilizing drinks, and uses a straw. Unable to cut food. Goal status: Ongoing  4.  Pt. Will demonstrate independence with proper A/E techniques/compensatory strategies for ADL/IADLs.  Baseline: 11/09/23: Continue Eval: Education to be provided Goal status: Ongoing  5.  Pt. Will write a sentence with 75% legibility with modified independence Baseline: 11/09/2023: 50% legibility for one sentence. Eval: 50% legibility for one printed sentence Goal status: Ongoing    ASSESSMENT:  CLINICAL IMPRESSION:  Pt. presents with less tone, and tightness through the right shoulder, RUE, and hand today. Pt. was able to tolerate manual therapy, stretches, and strengthening without pain, or discomfort. Pt. was able to tolerate increased resistance on the UBE this morning. Pt. continues to present  with increasing ROM, and continues to improve tolerance for ROM today. Pt. required consistent verbal cues, and cues for visual demonstration of proper technique as well as lateral and 3pt. pinch position. Pt. required verbal cues, and cues for visual demonstration to perform translatory movements moving the clips from the palm to the tip of the 2nd digit, and thumb in preparation from placing them onto the horizontal dowel. Pt. continues to benefit from OT services to work on improving overall bilateral UE functioning in order to maximize engagement in, and efficiency in ADLs, and IADL tasks, and provide education about compensatory strategies.     PERFORMANCE DEFICITS: in functional skills including ADLs, IADLs, coordination, dexterity, proprioception, sensation, ROM, strength, pain, Fine motor control, Gross motor control, and UE functional use, cognitive skills including , and psychosocial skills including coping strategies, environmental adaptation, and routines and behaviors.    IMPAIRMENTS: are limiting patient from ADLs, IADLs, and leisure.   CO-MORBIDITIES: may have co-morbidities  that affects occupational performance. Patient will benefit from skilled OT to address above impairments and improve overall function.  MODIFICATION OR ASSISTANCE TO COMPLETE EVALUATION: Min-Moderate modification of tasks or assist with assess necessary to complete an evaluation.  OT OCCUPATIONAL PROFILE AND HISTORY: Detailed assessment: Review of records and additional review of physical, cognitive, psychosocial  history related to current functional performance.  CLINICAL DECISION MAKING: Moderate - several treatment options, min-mod task modification necessary  REHAB POTENTIAL: Good  EVALUATION COMPLEXITY: Moderate    PLAN:  OT FREQUENCY: 2x/week  OT DURATION: 12 weeks  PLANNED INTERVENTIONS: 97168 OT Re-evaluation, 97535 self care/ADL training, 86578 therapeutic exercise, 97530 therapeutic activity, 97112 neuromuscular re-education, 97140 manual therapy, 97018 paraffin, 46962 moist heat, 97034 contrast bath, 97760 Orthotics management and training, 95284 Splinting (initial encounter), energy conservation, patient/family education, and DME and/or AE instructions  RECOMMENDED OTHER SERVICES:   PT  CONSULTED AND AGREED WITH PLAN OF CARE: Patient  PLAN FOR NEXT SESSION:   Treatment  Duey Ghent, MS, OTR/L   12/07/2023, 11:56 AM

## 2023-12-07 NOTE — Therapy (Signed)
 OUTPATIENT PHYSICAL THERAPY NEURO TREATMENT   Patient Name: Daniel Daugherty MRN: [id number] DOB:[birth year], 58 y.o., male Today's Date: 12/07/2023   PCP: Tawnya Fava, Waddell Guess, MD  REFERRING PROVIDER: Tawnya Fava, Waddell Guess, MD   END OF SESSION:   PT End of Session - 12/07/23 0930     Visit Number 15    Number of Visits 24    Date for PT Re-Evaluation 12/21/23    Authorization Type Medicare 2025    PT Start Time 0933    PT Stop Time 1015    PT Time Calculation (min) 42 min    Equipment Utilized During Treatment Gait belt    Activity Tolerance Patient tolerated treatment well;Patient limited by pain    Behavior During Therapy WFL for tasks assessed/performed                 Past Medical History:  Diagnosis Date   Hypertension    Past Surgical History:  Procedure Laterality Date   DG HAND LEFT COMPLETE ([facility] HX) Left 04/13/1999   There are no active problems to display for this patient.   ONSET DATE: 2022 is when he noticed the change and that was around the same time he was diagnosed with Parkinson's  REFERRING DIAG:  G20.A1 (ICD-10-CM) - Parkinson disease (HCC)  Z74.1 (ICD-10-CM) - Requires daily assistance for activities of daily living (ADL) and comfort needs   THERAPY DIAG:  Other lack of coordination  Difficulty in walking, not elsewhere classified  Unsteadiness on feet  Rationale for Evaluation and Treatment: Rehabilitation  SUBJECTIVE:                                                                                                                                                                                             SUBJECTIVE STATEMENT: Pt reports mild R shoulder and neck pain currently. He says it's improving. He reports no stumbles/falls.   PREVIOUSLY: Pt reports he had a good lunch with his son the other day after therapy, but he needs a haircut. Pt reports continuing to have "soreness" pain in R neck and shoulder. Pt reports he has an  Theme park manager he uses at night on his R neck/shoulder area to help manage the pain before going to bed.  Pt reports he has yet to call and get an appointment scheduled with the local neurologist, but plans to do it tomorrow.  Denies falls since last visit.  Pt accompanied by: self  PERTINENT HISTORY: Parkinsonism, Vitamin D deficiency, Hyperlipidemia, HTN, poor medication compliance (per chart review)  PAIN:  Are you having pain? Yes: NPRS scale: 5/10, but states "it  ain't that bad" Pain location: R shoulder and low back Pain description: throbbing in shoulder Aggravating factors: overhead movements or reaching back Relieving factors: rest and at home uses a "massager"  PRECAUTIONS: Fall  RED FLAGS: None   WEIGHT BEARING RESTRICTIONS: No  FALLS: Has patient fallen in last 6 months? Yes. 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   PATIENT GOALS: Get my balance right with my walking, help get my R arm stronger and be able to move it more, improve strength in my legs (specifically the Right)  OBJECTIVE:  Note: Objective measures were completed at Evaluation unless otherwise noted.  DIAGNOSTIC FINDINGS: N/A  COGNITION: Overall cognitive status: Within functional limits for tasks assessed; however, did not assess higher level cognitive tasks   SENSATION: WFL Light touch on screen Pt reports R foot  falls asleep sitting on toilet and he has difficult time getting feeling back in it before feeling safe standing up.  COORDINATION: Overall bradykinesia with decreased speed and amplitude of movements  EDEMA:  None present  MUSCLE TONE: Not formally assessed  MUSCLE LENGTH: Not formally assessed, but may have some hamstring tightness on R associated with quad weakness and inability to achieve full knee extension in sitting  DTRs:  Not assessed  POSTURE: rounded shoulders, forward head, increased thoracic kyphosis, posterior pelvic tilt, and flexed trunk    LOWER EXTREMITY ROM:     Active ROM Right Eval Left Eval  Hip flexion Decreased in sitting compared to L   Hip extension    Hip abduction    Hip adduction    Hip internal rotation    Hip external rotation    Knee flexion    Knee extension Lacks full knee extension in sitting   Ankle dorsiflexion Limited in sitting Limited in sitting  Ankle plantarflexion    Ankle inversion    Ankle eversion     (Blank rows = not tested)  LOWER EXTREMITY MMT:    MMT Right Eval Left Eval  Hip flexion 3- 4+  Hip extension    Hip abduction    Hip adduction    Hip internal rotation    Hip external rotation    Knee flexion 3 4+  Knee extension 3- 4+  Ankle dorsiflexion 3- 4+  Ankle plantarflexion 3- 4+  Ankle inversion    Ankle eversion    (Blank rows = not tested)  Manual Muscle Test Scale 0/5 = No muscle contraction can be seen or felt 1/5 = Contraction can be felt, but there is no motion 2-/5 = Part moves through incomplete ROM w/ gravity decreased 2/5 = Part moves through complete ROM w/ gravity decreased 2+/5 = Part moves through incomplete ROM (<50%) against gravity or through complete ROM w/ gravity 3-/5 = Part moves through incomplete ROM (>50%) against gravity 3/5 = Part moves through complete ROM against gravity 3+/5 = Part moves through complete ROM against gravity/slight resistance 4-/5= Holds test position  against slight  to moderate pressure 4/5 = Part moves through complete ROM against gravity/moderate resistance 4+/5= Holds test position against moderate to strong pressure 5/5 = Part moves through complete ROM against gravity/full resistance  BED MOBILITY:  Sit to supine   Supine to sit   Need to assess  ADL: Reports dificulty getitng on R shoe, specifically having trouble pushing his foot down in his shoe  TRANSFERS: Assistive device utilized:  Hurry cane   Sit to stand: Modified independence and SBA Stand to sit: Modified independence and SBA Chair to chair: Modified independence and SBA Floor:  would benefit from testing in future  RAMP:  Level of Assistance:    Assistive device utilized:  Hurry cane Ramp Comments: would benefit from assessing  CURB:  Level of Assistance:    Assistive device utilized:  [employer] Comments: would benefit from assessing  STAIRS: Level of Assistance: CGA and Min A Stair Negotiation Technique: Step to Pattern Forwards with Bilateral Rails Number of Stairs: 4  Height of Stairs: 6in  Comments: leading with R LE in both directions, attempts reciprocal pattern on ascent with sufficient strength, but having uncontrolled anterior lean during descent  GAIT: Gait pattern: step through pattern, decreased arm swing- Right, decreased arm swing- Left, decreased step length- Right, decreased step length- Left, decreased stride length, decreased hip/knee flexion- Right, decreased hip/knee flexion- Left, decreased ankle dorsiflexion- Right, decreased ankle dorsiflexion- Left, decreased trunk rotation, trunk flexed, poor foot clearance- Right, and poor foot clearance- Left Distance walked: 161ft Assistive device utilized: None Level of assistance: CGA Comments: significantly decreased  FUNCTIONAL TESTS:  5 times sit to stand: 38.53 seconds arms across chest Timed up and go (TUG): 19.46 seconds without AD 6 minute walk test: 10/04/2023: 592ft (142m at  0.48m/s avg) 10 meter walk test: 0.56 m/s without AD Mini-Best: 10/04/2023: 8/28  PATIENT SURVEYS:  ABC scale 17.5%  with pt feeling <40% confident on all of the items, pt feels only 30% confident walking around the house, 10% confident getting in/out of car, and 20% reaching for items  OCULOMOTOR EXAM from 11/16/2023 - performed due to Neurology note reporting concerns of decreased vertical gaze:  Ocular Alignment: normal  Ocular ROM: No Limitations  Spontaneous Nystagmus: absent  Gaze-Induced Nystagmus: absent  Smooth Pursuits: intact, good vertical movements with full range  Horizontal and Vertical Saccades: extra eye movements, slow, and frequently blinks, a few extra eye movements when moving to superior target                                                                                                                               TREATMENT DATE: 12/07/23   Unless otherwise stated, CGA was provided and gait belt donned in order to ensure pt safety throughout session.  NMR: Seated, finger flicks 20x emphasis on large-amplitude movement  Postural intervention - seated scapular retraction 10x with 3 sec hold/rep - cuing for increased amplitude of movement/speed, able to immediately correct  with cuing  STS with arm-swing/large-amplitude movement and continued emphasis on upright posture 10x  Seated FWD reach>reach down>reach up and reach out 5x limited by pain, cuing to perfom in pain-free range  Open book in sidelye 6x each side on large mat table   Gait: Large-amplitude gait training: emphasis on tall posture and increased step-length 2x148 with use of SPC, able to correct with cuing, but technique does decrease with fatigue      PATIENT EDUCATION: Education details: exercise technique Person educated: Patient Education method: Explanation Education comprehension: verbalized understanding and needs further education  HOME EXERCISE PROGRAM:  Access Code: [email or web address] URL:  [email or web address] Date: 12/02/2023 Prepared by: Carlen Chasten  Exercises - Sidelying Thoracic Rotation with Open Book  - 1 x daily - 7 x weekly - 2 sets - 10 reps - Supine Shoulder External Rotation in Abduction  - 1 x daily - 7 x weekly - 2 sets - 10 reps - 10 seconds hold - Seated Scapular Retraction  - 1 x daily - 7 x weekly - 3 sets - 10 reps - 3 seconds hold - Sit to Stand with Arm Swing  - 1 x daily - 7 x weekly - 2 sets - 10 reps   GOALS: Goals reviewed with patient? Yes  SHORT TERM GOALS: Target date: 11/09/2023    Patient will be independent in home exercise program to improve strength/mobility for better functional independence with ADLs. Baseline: need to initiate 10/07/2023: provided Goal status: IN PROGRESS   LONG TERM GOALS: Target date: 12/21/2023  1.  Patient (< 2 years old) will complete five times sit to stand test in < 12 seconds indicating an increased LE strength and improved balance. Baseline: 09/28/23: 38.53 seconds 11/09/2023: 22.13 seconds with arms across chest Goal status: IN PROGRESS   2. Patient will reduce timed up and go to <11 seconds to reduce fall risk and demonstrate improved transfer/gait ability. Baseline: 09/28/23: 19.46 seconds without AD 11/09/2023: 16.96 seconds, no AD, with close SBA for balance safety Goal status: IN PROGRESS  3.  Patient will increase 10 meter walk test to >1.25m/s as to improve gait speed for better community ambulation and to reduce fall risk. Baseline: 09/28/23: 0.74m/s without AD 11/09/2023: 0.89 m/s (11.50 seconds seconds 10.94 seconds) no AD, with close SBA for safety Goal status: IN PROGRESS  4.  Patient will increase six minute walk test distance to >1034ft for progression to community ambulator and improve gait ability Baseline: 10/04/2023: 551ft (136m at 0.31m/s avg) 11/09/2023: 1061 feet (323 meters, Avg speed 0.897 m/s) without AD Goal status: MET and Advanced 11/09/2023 Goal Updated: distance >  1327ft  5. Patient will increase MiniBest Test score to >18/28 to indicate a reduced risk for falling and demonstrate increased independence with functional mobility and ADLs.  Baseline: 10/04/2023: 8/28  11/16/2023: 16/28   Goal status: IN PROGRESS  6. Patient will increase ABC scale score >80% to demonstrate better functional mobility and better confidence with ADLs.   Baseline: 09/28/23: 17.5% 11/09/2023: 21.25% - pt reports he rated the test as is if he was not using his walker because this is his long term goal   Goal status: IN PROGRESS   ASSESSMENT:  CLINICAL IMPRESSION:   Patient arrived motivated to participate in therapy session. Therapy session focused on updating pt's HEP to provide more targeted gentle UE stretching due to pt reporting R shoulder pain with seated exercises. Therapist also added sit>stand functional strengthening exercise on HEP. Pt completed 1 set of  each exercise during session demonstrating proper form/technique and tolerated well. Therapy session also focused on dynamic gait training with use of external targets to promote increased foot clearance, step length, and heel strike during gait with pt responding best to the larger obstacles (1/2 foam roll). Daniel Daugherty will benefit from continued skilled PT to improve these deficits in order to increase QOL and ease/safety with ADLs and functional mobility as well as decrease risk for falls.   OBJECTIVE IMPAIRMENTS: Abnormal gait, decreased activity tolerance, decreased balance, decreased coordination, decreased endurance, decreased knowledge of condition, decreased knowledge of use of DME, decreased mobility, difficulty walking, decreased ROM, decreased strength, decreased safety awareness, improper body mechanics, postural dysfunction, and pain.   ACTIVITY LIMITATIONS: carrying, lifting, bending, standing, squatting, sleeping, stairs, transfers, bed mobility, continence, bathing, toileting, dressing, reach over head,  hygiene/grooming, and locomotion level  PARTICIPATION LIMITATIONS: meal prep, cleaning, laundry, and community activity  PERSONAL FACTORS: Fitness and 3+ comorbidities: Vitamin D deficiency, Hyperlipidemia, and HTN  are also affecting patient's functional outcome.   REHAB POTENTIAL: Good  CLINICAL DECISION MAKING: Evolving/moderate complexity  EVALUATION COMPLEXITY: Moderate  PLAN:  PT FREQUENCY: 1-2x/week  PT DURATION: 12 weeks  PLANNED INTERVENTIONS: 97164- PT Re-evaluation, 97110-Therapeutic exercises, 97530- Therapeutic activity, 97112- Neuromuscular re-education, 97535- Self Care, 02725- Manual therapy, [phone]- Gait training, [phone]- Orthotic Fit/training, [phone]- Canalith repositioning, [phone]- Electrical stimulation (manual), Patient/Family education, Balance training, Stair training, Joint mobilization, Vestibular training, Visual/preceptual remediation/compensation, DME instructions, Cryotherapy, and Moist heat  PLAN FOR NEXT SESSION:  - review updated HEP  - gait training with increased intensity - dynamic gait with focus on increased upright posture and speed of ambulation with arm swing (add AWs to UEs with boxing gloves)  - add dual-task challenges of head rotations, stepping over obstacles, sudden turns, and changes in gait speed - stepping in different directions   - with dual-task of Blaze Pods to promote increased speed/amplitude of movements  - continue arm reaching and trunk rotation (specifically to R) - standing balance: narrow BOS/SLS, on compliant surfaces, incline, eyes open&eyes closed - stepping strategy re-training: focus on posterior stepping - large amplitude UE movements with promotion of improved upright posture - B LE strengthening (R>L)  - progress to floor transfers    Aminta Kales PT, DPT  Physical Therapist - [facility]  10:29 AM 12/07/23

## 2023-12-09 ENCOUNTER — Ambulatory Visit: Payer: 59 | Admitting: Physical Therapy

## 2023-12-09 ENCOUNTER — Ambulatory Visit: Payer: 59 | Admitting: Occupational Therapy

## 2023-12-09 DIAGNOSIS — M6281 Muscle weakness (generalized): Secondary | ICD-10-CM

## 2023-12-09 DIAGNOSIS — R269 Unspecified abnormalities of gait and mobility: Secondary | ICD-10-CM

## 2023-12-09 DIAGNOSIS — R531 Weakness: Secondary | ICD-10-CM

## 2023-12-09 DIAGNOSIS — R262 Difficulty in walking, not elsewhere classified: Secondary | ICD-10-CM

## 2023-12-09 DIAGNOSIS — R278 Other lack of coordination: Secondary | ICD-10-CM

## 2023-12-09 DIAGNOSIS — R2681 Unsteadiness on feet: Secondary | ICD-10-CM

## 2023-12-09 NOTE — Therapy (Signed)
 Occupational TherapyNeuro Treatment Note  Patient Name: Daniel Daugherty MRN: [id number] DOB:July 25, 1966, 58 y.o., male Today's Date: 12/09/2023  PCP: N/A REFERRING PROVIDER: Arlen Belton, MD  END OF SESSION:  OT End of Session - 12/09/23 1425     Visit Number 17    Number of Visits 24    Date for OT Re-Evaluation 12/21/23    OT Start Time 1020    OT Stop Time 1100    OT Time Calculation (min) 40 min    Activity Tolerance Patient tolerated treatment well    Behavior During Therapy WFL for tasks assessed/performed                 Past Medical History:  Diagnosis Date   Hypertension    Past Surgical History:  Procedure Laterality Date   DG HAND LEFT COMPLETE ([facility] HX) Left 04/13/1999   There are no active problems to display for this patient.   ONSET DATE: 05/2021  REFERRING DIAG: Parkinson's Disease  THERAPY DIAG:  Muscle weakness (generalized)  Other lack of coordination  Rationale for Evaluation and Treatment: Rehabilitation  SUBJECTIVE:   SUBJECTIVE STATEMENT:  Pt. reports that they will cookout for Mother's Day this weekend. Pt accompanied by: self  PERTINENT HISTORY:   Pt. is a 57 y.o. male who was diagnosed with Parkinson's Disease in 2022. Pt. reports having had a progressive decline in function since having hernia surgery, and right shoulder rotator cuff surgery. PMHx included: HTN. Of note; Pt. was being followed by [facility], and receiving Home health therapy services. Pt. changed physicians, and was referred for outpatient rehab services here.  PRECAUTIONS: None  WEIGHT BEARING RESTRICTIONS: No  PAIN:  Are you having pain? 0/10  FALLS: Has patient fallen in last 6 months? No, 1 small slip near the bed tripped over shoes  LIVING ENVIRONMENT: Lives with: lives alone Lives in: [place],  Stairs: Entry level Has following equipment at home: Quad cane small base, rollator, shower chair, grab bars, hand rail at [employer]  PLOF:  Independent  PATIENT GOALS: Improve self-care  OBJECTIVE:  Note: Objective measures were completed at Evaluation unless otherwise noted.  HAND DOMINANCE: Right  ADLs:  Assist at home:  Ex wife, and children assist several times a week  Transfers/ambulation related to ADLs: Modified Independent Eating: Difficulty using dominant right hand. Drops food when scooping, Both hands for stabilizing drinks, and uses a straw. Unable to cut food.  Grooming: Increased time required for all grooming tasks. UB Dressing: Difficulty managing buttons LB Dressing: Less assist with pants with elastic, more assist with dress pants Toileting: Increased time for hygiene. Bathing: Requires increased time to complete Tub Shower transfers: Modified Independence with increased time  IADLs: Shopping: Son assists with grocery shopping Light housekeeping: Son's assist. Pt. Does light rising off of dishes. Meal Prep: Increased time. Independent with microwave, and air fryer. Difficulty opening bottles, containers Community mobility:  Transportation, light driving to grocery store Medication management:  Son's set-up weekly pillbox, Pt. Is responsible for taking the medications Financial management: Son's assist with monthly finances Handwriting: 50% legible Hobbies: Dancing, watching sports Career/work: Restaurant manager, fast food  MOBILITY STATUS: Uses a cane; shuffling gait  FUNCTIONAL OUTCOME MEASURES:  TBD  UPPER EXTREMITY ROM:    Active ROM Right eval Right 11/09/23 Left Eval [facility] overall  Shoulder flexion WFL 120(134)   Shoulder abduction 52(100) 60(110)   Shoulder adduction     Shoulder extension     Shoulder internal rotation  Shoulder external rotation     Elbow flexion [facility] WFL   Elbow extension [facility]   Wrist flexion     Wrist extension [facility]   Wrist ulnar deviation     Wrist radial deviation     Wrist pronation     Wrist supination     (Blank rows = not tested)  UPPER  EXTREMITY MMT:     MMT Right eval Right 11/09/23 Left Eval 4-/5 overall Left 11/09/23 4/5 Overall  Shoulder flexion 3/5 3-/5    Shoulder abduction 2/5 2/5    Shoulder adduction      Shoulder extension      Shoulder internal rotation      Shoulder external rotation      Middle trapezius      Lower trapezius      Elbow flexion 3/5 3+/5    Elbow extension 3/5 3+5    Wrist flexion      Wrist extension 3/5 3/5    Wrist ulnar deviation      Wrist radial deviation      Wrist pronation      Wrist supination      (Blank rows = not tested)  HAND FUNCTION:   Eval: Grip strength: Right: 50 lbs; Left: 34 lbs, Lateral pinch: Right: 17 lbs, Left: 9 lbs, 3 point pinch: Right: 12 lbs, Left: 11 lbs  11/09/23: Grip strength: Right: 60 lbs; Left: 35 lbs, Lateral pinch: Right: 19 lbs, Left: 11 lbs, 3 point pinch: Right: 13 lbs, Left: 9 lbs  COORDINATION:  Eval: 9 Hole Peg test: Right: 1 min. & 46 sec; Left: 53 sec  11/09/2023: 9 Hole Peg test: Right: 1 min. & 22 sec; Left: 46 sec   SENSATION:  Light touch: WFL Proprioception: WFL  EDEMA: N/A  COGNITION: Overall cognitive status: Within functional limits for tasks assessed  VISION:  Does not wear glasses.  VISION ASSESSMENT: To be further assessed in functional context  PERCEPTION: WFL  PRAXIS: Impaired: Motor planning                                                                                                                         TREATMENT DATE: 12/09/23  Therapeutic Ex:   -A/AAROM for the RUE through all joint ranges of motion following manual therapy. -UE strengthening through reciprocal motion using the SciFit for 10 min. At resistance level 2.0 with constant monitoring of the BUEs.    Manual Therapy:   -Soft tissue massage was performed to the scapular, and UE musculature 2/2 tightness -Pt. tolerated scapular mobilization for elevation, depression, and abduction/rotation in sitting to normalize tone, decrease  tightness, and prepare the RUE ROM, strengthening, and functional use.   -Manual therapy was performed independent of, and in preparation for therapeutic Ex.    PATIENT EDUCATION: Education details: UE ROM, ADL functional status Person educated: Patient Education method: Explanation, Demonstration, Tactile cues, and Verbal cues Education comprehension: needs further education  HOME EXERCISE PROGRAM:  Continue to assess, and provide as indicated.   GOALS: Goals reviewed with patient? Yes  SHORT TERM GOALS: Target date: 11/09/2023    Pt. Will be independent with HEPs for RUE. Baseline: 11/09/2023: Independent Eval: No current HEP Goal status:Ongoing  LONG TERM GOALS: Target date: 12/21/2023  1.  Pt. Will increase right shoulder abduction by 10 degrees to assist with UE dressing Baseline: 11/09/23: Right 60/(110) Eval: Right 52(100) Goal status: Ongoing  2.  Pt. Will increase BUE strength by 2 mm grades to assist with ADLs. Baseline: 11/09/23: Right: shoulder flexion: 3-/5, abduction: 2/5, elbow flexion/extension: 3+/5, wrist extension: 3/5. Left: 4/5 overallEval: Right: shoulder flexion: 3/5, abduction: 2/5, elbow flexion/extension: 3/5, wrist extension: 3/5. Left: 4-/5 overall Goal status: Ongoing  3.  Pt. Will perform self-feeding with modified independence Baseline: 11/09/23: Pt. Continues to have difficulty using dominant right hand. Drops food when scooping, Both hands for stabilizing drinks, and uses a straw. Unable to cut food. Eval: Difficulty using dominant right hand. Drops food when scooping, Both hands for stabilizing drinks, and uses a straw. Unable to cut food. Goal status: Ongoing  4.  Pt. Will demonstrate independence with proper A/E techniques/compensatory strategies for ADL/IADLs.  Baseline: 11/09/23: Continue Eval: Education to be provided Goal status: Ongoing  5.  Pt. Will write a sentence with 75% legibility with modified independence Baseline: 11/09/2023: 50%  legibility for one sentence. Eval: 50% legibility for one printed sentence Goal status: Ongoing    ASSESSMENT:  CLINICAL IMPRESSION:  Pt. presents with significantly less tone, and tightness through the right shoulder, RUE, and hand today. Pt. was able to tolerate manual therapy, stretches, and strengthening without pain, or discomfort. Pt. was able to tolerate increased resistance on the UBE this morning. Pt. continues to consistently tolerate increasing ROM, and continues to improve tolerance for ROM.  Pt. Was able to perform  right [facility] skills using the grooved pegboard with minimal drooping of the items from his right hand. Pt. did present with difficulty performing translatory movements moving the grooved pegs from the palm to the tip of his 2nd digit, and thumb in preparation for placing them into the Grooved Pegboard.  Pt. continues to benefit from OT services to work on improving overall bilateral UE functioning in order to maximize engagement in, and efficiency in ADLs, and IADL tasks, and provide education about compensatory strategies.     PERFORMANCE DEFICITS: in functional skills including ADLs, IADLs, coordination, dexterity, proprioception, sensation, ROM, strength, pain, Fine motor control, Gross motor control, and UE functional use, cognitive skills including , and psychosocial skills including coping strategies, environmental adaptation, and routines and behaviors.   IMPAIRMENTS: are limiting patient from ADLs, IADLs, and leisure.   CO-MORBIDITIES: may have co-morbidities  that affects occupational performance. Patient will benefit from skilled OT to address above impairments and improve overall function.  MODIFICATION OR ASSISTANCE TO COMPLETE EVALUATION: Min-Moderate modification of tasks or assist with assess necessary to complete an evaluation.  OT OCCUPATIONAL PROFILE AND HISTORY: Detailed assessment: Review of records and additional review of physical, cognitive,  psychosocial history related to current functional performance.  CLINICAL DECISION MAKING: Moderate - several treatment options, min-mod task modification necessary  REHAB POTENTIAL: Good  EVALUATION COMPLEXITY: Moderate    PLAN:  OT FREQUENCY: 2x/week  OT DURATION: 12 weeks  PLANNED INTERVENTIONS: 97168 OT Re-evaluation, 97535 self care/ADL training, 91478 therapeutic exercise, 97530 therapeutic activity, 97112 neuromuscular re-education, 97140 manual therapy, 97018 paraffin, 29562 moist heat, 97034 contrast bath, 97760 Orthotics management and training,  19147 Splinting (initial encounter), energy conservation, patient/family education, and DME and/or AE instructions  RECOMMENDED OTHER SERVICES:   PT  CONSULTED AND AGREED WITH PLAN OF CARE: Patient  PLAN FOR NEXT SESSION:   Treatment  Shariq Puig, MS, OTR/L   12/09/2023, 2:32 PM

## 2023-12-09 NOTE — Therapy (Signed)
 OUTPATIENT PHYSICAL THERAPY NEURO TREATMENT   Patient Name: Daniel Daugherty MRN: [id number] DOB:[birth year], 58 y.o., male Today's Date: 12/09/2023   PCP: Tawnya Fava, Waddell Guess, MD  REFERRING PROVIDER: Tawnya Fava, Waddell Guess, MD   END OF SESSION:   PT End of Session - 12/09/23 0920     Visit Number 16    Number of Visits 24    Date for PT Re-Evaluation 12/21/23    Authorization Type Medicare 2025    PT Start Time 0930    PT Stop Time 1015    PT Time Calculation (min) 45 min    Equipment Utilized During Treatment Gait belt    Activity Tolerance Patient tolerated treatment well   Behavior During Therapy WFL for tasks assessed/performed             Past Medical History:  Diagnosis Date   Hypertension    Past Surgical History:  Procedure Laterality Date   DG HAND LEFT COMPLETE ([facility] HX) Left 04/13/1999   There are no active problems to display for this patient.   ONSET DATE: 2022 is when he noticed the change and that was around the same time he was diagnosed with Parkinson's  REFERRING DIAG:  G20.A1 (ICD-10-CM) - Parkinson disease (HCC)  Z74.1 (ICD-10-CM) - Requires daily assistance for activities of daily living (ADL) and comfort needs   THERAPY DIAG:  Muscle weakness (generalized)  Other lack of coordination  Difficulty in walking, not elsewhere classified  Unsteadiness on feet  Abnormality of gait  Generalized weakness  Rationale for Evaluation and Treatment: Rehabilitation  SUBJECTIVE:                                                                                                                                                                                             SUBJECTIVE STATEMENT:  Pt states "I have my days." States "I want to get better." Pt states he doesn't like that he needs help with so many things.  Pt states he still hasn't called the neurologist to schedule an appointment.   Reports "a little bit" of pain in R shoulder. Pt states  he "bumped" his head "getting up off a stool" a few days ago, but he didn't fall.   Pt reports HEP is going well following recent modifications.    Pt accompanied by: self  PERTINENT HISTORY: Parkinsonism, Vitamin D deficiency, Hyperlipidemia, HTN, poor medication compliance (per chart review)  PAIN:  Are you having pain? Yes: NPRS scale: 5/10, but states "it ain't that bad" Pain location: R shoulder and low back Pain description: throbbing in shoulder Aggravating factors: overhead movements or reaching  back Relieving factors: rest and at home uses a "massager"  PRECAUTIONS: Fall  RED FLAGS: None   WEIGHT BEARING RESTRICTIONS: No  FALLS: Has patient fallen in last 6 months? Yes. 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   PATIENT GOALS: Get my balance right with my walking, help get my R arm stronger and be able to move it more, improve strength in my legs (specifically the Right)  OBJECTIVE:  Note: Objective measures were completed at Evaluation unless otherwise noted.  DIAGNOSTIC FINDINGS: N/A  COGNITION: Overall cognitive status: Within functional limits for tasks assessed; however, did not assess higher level cognitive tasks   SENSATION: WFL Light touch on screen Pt reports R foot falls asleep sitting on toilet and he has difficult time getting feeling back in it before feeling safe standing  up.  COORDINATION: Overall bradykinesia with decreased speed and amplitude of movements  EDEMA:  None present  MUSCLE TONE: Not formally assessed  MUSCLE LENGTH: Not formally assessed, but may have some hamstring tightness on R associated with quad weakness and inability to achieve full knee extension in sitting  DTRs:  Not assessed  POSTURE: rounded shoulders, forward head, increased thoracic kyphosis, posterior pelvic tilt, and flexed trunk    LOWER EXTREMITY ROM:     Active ROM Right Eval Left Eval  Hip flexion Decreased in sitting compared to L   Hip extension    Hip abduction    Hip adduction    Hip internal rotation    Hip external rotation    Knee flexion    Knee extension Lacks full knee extension in sitting   Ankle dorsiflexion Limited in sitting Limited in sitting  Ankle plantarflexion    Ankle inversion    Ankle eversion     (Blank rows = not tested)  LOWER EXTREMITY MMT:    MMT Right Eval Left Eval  Hip flexion 3- 4+  Hip extension    Hip abduction    Hip adduction    Hip internal rotation    Hip external rotation    Knee flexion 3 4+  Knee extension 3- 4+  Ankle dorsiflexion 3- 4+  Ankle plantarflexion 3- 4+  Ankle inversion    Ankle eversion    (Blank rows = not tested)  Manual Muscle Test Scale 0/5 = No muscle contraction can be seen or felt 1/5 = Contraction can be felt, but there is no motion 2-/5 = Part moves through incomplete ROM w/ gravity decreased 2/5 = Part moves through complete ROM w/ gravity decreased 2+/5 = Part moves through incomplete ROM (<50%) against gravity or through complete ROM w/ gravity 3-/5 = Part moves through incomplete ROM (>50%) against gravity 3/5 = Part moves through complete ROM against gravity 3+/5 = Part moves through complete ROM against gravity/slight resistance 4-/5= Holds test position against slight to moderate pressure 4/5 = Part moves through complete ROM against gravity/moderate  resistance 4+/5= Holds test position against moderate to strong pressure 5/5 =  Part moves through complete ROM against gravity/full resistance  BED MOBILITY:  Sit to supine   Supine to sit   Need to assess  ADL: Reports dificulty getitng on R shoe, specifically having trouble pushing his foot down in his shoe  TRANSFERS: Assistive device utilized: Hurry cane  Sit to stand: Modified independence and SBA Stand to sit: Modified independence and SBA Chair to chair: Modified independence and SBA Floor: would benefit from testing in future  RAMP:  Level of Assistance:   Assistive device utilized: Hurry cane Ramp Comments: would benefit from assessing  CURB:  Level of Assistance:   Assistive device utilized: [employer] Comments: would benefit from assessing  STAIRS: Level of Assistance: CGA and Min A Stair Negotiation Technique: Step to Pattern Forwards with Bilateral Rails Number of Stairs: 4  Height of Stairs: 6in  Comments: leading with R LE in both directions, attempts reciprocal pattern on ascent with sufficient strength, but having uncontrolled anterior lean during descent  GAIT: Gait pattern: step through pattern, decreased arm swing- Right, decreased arm swing- Left, decreased step length- Right, decreased step length- Left, decreased stride length, decreased hip/knee flexion- Right, decreased hip/knee flexion- Left, decreased ankle dorsiflexion- Right, decreased ankle dorsiflexion- Left, decreased trunk rotation, trunk flexed, poor foot clearance- Right, and poor foot clearance- Left Distance walked: 169ft Assistive device utilized: None Level of assistance: CGA Comments: significantly decreased  FUNCTIONAL TESTS:  5 times sit to stand: 38.53 seconds arms across chest Timed up and go (TUG): 19.46 seconds without AD 6 minute walk test: 10/04/2023: 573ft (170m at 0.24m/s avg) 10 meter walk test: 0.56 m/s without AD Mini-Best: 10/04/2023: 8/28  PATIENT SURVEYS:  ABC  scale 17.5% with pt feeling <40% confident on all of the items, pt feels only 30% confident walking around the house, 10% confident getting in/out of car, and 20% reaching for items  OCULOMOTOR EXAM from 11/16/2023 - performed due to Neurology note reporting concerns of decreased vertical gaze:  Ocular Alignment: normal  Ocular ROM: No Limitations  Spontaneous Nystagmus: absent  Gaze-Induced Nystagmus: absent  Smooth Pursuits: intact, good vertical movements with full range  Horizontal and Vertical Saccades: extra eye movements, slow, and frequently blinks, a few extra eye movements when moving to superior target                                                                                                                               TREATMENT DATE: 12/09/23   Unless otherwise stated, CGA was provided and gait belt donned in order to ensure pt safety throughout session.  Dynamic gait training ~563ft with focus on fast forwards walking to achieve reciprocal stepping pattern with increased arm swing using boxing gloves with external targets - pt does excellent identifying if he is not moving contralateral limbs together - continues to have excessive forward trunk flexion with thoracic kyphosis and forward head  Therapist stays close for SBA with 2x providing light min A  for safety due to minor posterior LOB Pt with a few instances of freezing when turning while trying to maintain boxing of external targets   Repeated sit<>stands from EOM 2x 10 reps - focus on shoulder extension when coming to stand and upright posture - therapist providing visual feedback to promote increased power in his movement when rising and achieving full upright posture  Dynamic gait training of forward reciprocal pattern over 4x 1/2 foam rolls and then backwards stepping over them starting with step-to leading with each foot progressed to alternating step-to and then full reciprocal pattern - 2 x6reps Requires  consistent min A and approximately 4x mod/max A to prevent LOB when pt not fully clearing foot over target when performing reciprocal pattern backwards   Standing rows against 2.5lb resistance at cable machine focusing strengthening to improved posture 2x10 reps with cuing for proper form/technique   PATIENT EDUCATION: Education details: exercise technique Person educated: Patient Education method: Explanation Education comprehension: verbalized understanding and needs further education  HOME EXERCISE PROGRAM:  Access Code: [email or web address] URL: [email or web address] Date: 12/02/2023 Prepared by: Carlen Chasten  Exercises - Sidelying Thoracic Rotation with Open Book  - 1 x daily - 7 x weekly - 2 sets - 10 reps - Supine Shoulder External Rotation in Abduction  - 1 x daily - 7 x weekly - 2 sets - 10 reps - 10 seconds hold - Seated Scapular Retraction  - 1 x daily - 7 x weekly - 3 sets - 10 reps - 3 seconds hold - Sit to Stand with Arm Swing  - 1 x daily - 7 x weekly - 2 sets - 10 reps   GOALS: Goals reviewed with patient? Yes  SHORT TERM GOALS: Target date: 11/09/2023    Patient will be independent in home exercise program to improve strength/mobility for better functional independence with ADLs. Baseline: need to initiate 10/07/2023: provided Goal status: IN PROGRESS   LONG TERM GOALS: Target date: 12/21/2023  1.  Patient (< 61 years old) will complete five times sit to stand test in < 12 seconds indicating an increased LE strength and improved balance. Baseline: 09/28/23: 38.53 seconds 11/09/2023: 22.13 seconds with arms across chest Goal status: IN PROGRESS   2. Patient will reduce timed up and go to <11 seconds to reduce fall risk and demonstrate improved transfer/gait ability. Baseline: 09/28/23: 19.46 seconds without AD 11/09/2023: 16.96 seconds, no AD, with close SBA for balance safety Goal status: IN PROGRESS  3.  Patient will increase 10 meter walk test to >1.37m/s as  to improve gait speed for better community ambulation and to reduce fall risk. Baseline: 09/28/23: 0.13m/s without AD 11/09/2023: 0.89 m/s (11.50 seconds seconds 10.94 seconds) no AD, with close SBA for safety Goal status: IN PROGRESS  4.  Patient will increase six minute walk test distance to >1034ft for progression to community ambulator and improve gait ability Baseline: 10/04/2023: 523ft (162m at 0.5m/s avg) 11/09/2023: 1061 feet (323 meters, Avg speed 0.897 m/s) without AD Goal status: MET and Advanced 11/09/2023 Goal Updated: distance > 1319ft  5. Patient will increase MiniBest Test score to >18/28 to indicate a reduced risk for falling and demonstrate increased independence with functional mobility and ADLs.  Baseline: 10/04/2023: 8/28  11/16/2023: 16/28   Goal status: IN PROGRESS  6. Patient will increase ABC scale score >80% to demonstrate better functional mobility and better confidence with ADLs.   Baseline: 09/28/23: 17.5% 11/09/2023: 21.25% - pt reports he rated the test as is if he  was not using his walker because this is his long term goal   Goal status: IN PROGRESS   ASSESSMENT:  CLINICAL IMPRESSION:   Patient arrived motivated to participate in therapy session. Therapy session focused on dynamic gait training with use of external targets to promote increased arm swing, increased foot clearance, step length, and heel strike during gait with continued use of boxing gloves and stepping over 1/2 foam rolls. Patient also participated in strengthening exercise to improve upright posture to promote improved COM over BOS for improved balance. Mr. Asel will benefit from continued skilled PT to improve these deficits in order to increase QOL and ease/safety with ADLs and functional mobility as well as decrease risk for falls.   OBJECTIVE IMPAIRMENTS: Abnormal gait, decreased activity tolerance, decreased balance, decreased coordination, decreased endurance, decreased knowledge of condition,  decreased knowledge of use of DME, decreased mobility, difficulty walking, decreased ROM, decreased strength, decreased safety awareness, improper body mechanics, postural dysfunction, and pain.   ACTIVITY LIMITATIONS: carrying, lifting, bending, standing, squatting, sleeping, stairs, transfers, bed mobility, continence, bathing, toileting, dressing, reach over head, hygiene/grooming, and locomotion level  PARTICIPATION LIMITATIONS: meal prep, cleaning, laundry, and community activity  PERSONAL FACTORS: Fitness and 3+ comorbidities: Vitamin D deficiency, Hyperlipidemia, and HTN are also affecting patient's functional outcome.   REHAB POTENTIAL: Good  CLINICAL DECISION MAKING: Evolving/moderate complexity  EVALUATION COMPLEXITY: Moderate  PLAN:  PT FREQUENCY: 1-2x/week  PT DURATION: 12 weeks  PLANNED INTERVENTIONS: 97164- PT Re-evaluation, 97110-Therapeutic exercises, 97530- Therapeutic activity, 97112- Neuromuscular re-education, 97535- Self Care, 19147- Manual therapy, [phone]- Gait training, [phone]- Orthotic Fit/training, [phone]- Canalith repositioning, [phone]- Electrical stimulation (manual), Patient/Family education, Balance training, Stair training, Joint mobilization, Vestibular training, Visual/preceptual remediation/compensation, DME instructions, Cryotherapy, and Moist heat  PLAN FOR NEXT SESSION: - review updated HEP  - gait training with increased intensity - dynamic gait with focus on increased upright posture and speed of ambulation with arm swing (add AWs to UEs with boxing gloves)  - add dual-task challenges of head rotations, stepping over obstacles, sudden turns, and changes in gait speed - stepping in different directions   - with dual-task of Blaze Pods to promote increased speed/amplitude of movements  - continue arm reaching and trunk rotation (specifically to R) - standing balance: narrow BOS/SLS, on compliant surfaces, incline, eyes open&eyes closed - stepping  strategy re-training: focus on posterior stepping - large amplitude UE movements with promotion of improved upright posture - B LE strengthening (R>L)  - progress to floor transfers    Carlen Chasten, PT, DPT, NCS, CSRS Physical Therapist - [facility]  [facility]  10:16 AM 12/09/23

## 2023-12-13 NOTE — Therapy (Signed)
 OUTPATIENT PHYSICAL THERAPY NEURO TREATMENT   Patient Name: Daniel Daugherty MRN: [id number] DOB:Aug 05, 1965, 58 y.o., male Today's Date: 12/14/2023   PCP: Tawnya Fava, Waddell Guess, MD  REFERRING PROVIDER: Tawnya Fava, Waddell Guess, MD   END OF SESSION:   PT End of Session - 12/09/23 0920     Visit Number 16    Number of Visits 24    Date for PT Re-Evaluation 12/21/23    Authorization Type Medicare 2025    PT Start Time 0930    PT Stop Time 1015    PT Time Calculation (min) 45 min    Equipment Utilized During Treatment Gait belt    Activity Tolerance Patient tolerated treatment well   Behavior During Therapy WFL for tasks assessed/performed             Past Medical History:  Diagnosis Date   Hypertension    Past Surgical History:  Procedure Laterality Date   DG HAND LEFT COMPLETE ([facility] HX) Left 04/13/1999   There are no active problems to display for this patient.   ONSET DATE: 2022 is when he noticed the change and that was around the same time he was diagnosed with Parkinson's  REFERRING DIAG:  G20.A1 (ICD-10-CM) - Parkinson disease (HCC)  Z74.1 (ICD-10-CM) - Requires daily assistance for activities of daily living (ADL) and comfort needs   THERAPY DIAG:  Muscle weakness (generalized)  Difficulty in walking, not elsewhere classified  Unsteadiness on feet  Rationale for Evaluation and Treatment: Rehabilitation  SUBJECTIVE:                                                                                                                                                                                             SUBJECTIVE STATEMENT:  Patient reports doing well. No falls or LOB. Got to see his grandchildren over the weekend.    Pt accompanied by: self  PERTINENT HISTORY: Parkinsonism, Vitamin D deficiency, Hyperlipidemia, HTN, poor medication compliance (per chart review)  PAIN:  Are you having pain? Yes: NPRS scale: 5/10, but states "it ain't that bad" Pain  location: R shoulder and low back Pain description: throbbing in shoulder Aggravating factors: overhead movements or reaching back Relieving factors: rest and at home uses a "massager"  PRECAUTIONS: Fall  RED FLAGS: None   WEIGHT BEARING RESTRICTIONS: No  FALLS: Has patient fallen in last 6 months? Yes. 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   PATIENT GOALS: Get my balance right with my walking, help get my R arm stronger and be able to move it more, improve strength in my legs (specifically the Right)  OBJECTIVE:  Note: Objective measures were completed at Evaluation unless otherwise noted.  DIAGNOSTIC FINDINGS: N/A  COGNITION: Overall cognitive status: Within functional limits for tasks assessed; however, did not assess higher level cognitive tasks   SENSATION: WFL Light touch on screen Pt reports R foot falls asleep sitting on toilet and he has difficult time getting feeling back in it before feeling safe standing up.  COORDINATION: Overall bradykinesia with decreased speed and amplitude of movements  EDEMA:  None present  MUSCLE TONE: Not formally assessed  MUSCLE LENGTH: Not formally assessed, but may have some hamstring tightness on R associated with quad weakness and inability to achieve full knee extension in sitting  DTRs:  Not assessed  POSTURE: rounded shoulders, forward head,  increased thoracic kyphosis, posterior pelvic tilt, and flexed trunk    LOWER EXTREMITY ROM:     Active ROM Right Eval Left Eval  Hip flexion Decreased in sitting compared to L   Hip extension    Hip abduction    Hip adduction    Hip internal rotation    Hip external rotation    Knee flexion    Knee extension Lacks full knee extension in sitting   Ankle dorsiflexion Limited in sitting Limited in sitting  Ankle plantarflexion    Ankle inversion    Ankle eversion     (Blank rows = not tested)  LOWER EXTREMITY MMT:    MMT Right Eval Left Eval  Hip flexion 3- 4+  Hip extension    Hip abduction    Hip adduction    Hip internal rotation    Hip external rotation    Knee flexion 3 4+  Knee extension 3- 4+  Ankle dorsiflexion 3- 4+  Ankle plantarflexion 3- 4+  Ankle inversion    Ankle eversion    (Blank rows = not tested)  Manual Muscle Test Scale 0/5 = No muscle contraction can be seen or felt 1/5 = Contraction can be felt, but there is no motion 2-/5 = Part moves through incomplete ROM w/ gravity decreased 2/5 = Part moves through complete ROM w/ gravity decreased 2+/5 = Part moves through incomplete ROM (<50%) against gravity or through complete ROM w/ gravity 3-/5 = Part moves through incomplete ROM (>50%) against gravity 3/5 = Part moves through complete ROM against gravity 3+/5 = Part moves through complete ROM against gravity/slight resistance 4-/5= Holds test position against slight to moderate pressure 4/5 = Part moves through complete ROM against gravity/moderate resistance 4+/5= Holds test position against moderate to strong pressure 5/5 = Part moves through complete ROM against gravity/full resistance  BED MOBILITY:  Sit to supine   Supine to sit   Need to assess  ADL: Reports dificulty getitng on R shoe, specifically having trouble pushing his foot down in his shoe  TRANSFERS: Assistive device utilized: Hurry cane  Sit to stand: Modified independence  and SBA Stand to sit: Modified independence and SBA Chair to chair: Modified independence and SBA Floor: would benefit  from testing in future  RAMP:  Level of Assistance:   Assistive device utilized: Hurry cane Ramp Comments: would benefit from assessing  CURB:  Level of Assistance:   Assistive device utilized: [employer] Comments: would benefit from assessing  STAIRS: Level of Assistance: CGA and Min A Stair Negotiation Technique: Step to Pattern Forwards with Bilateral Rails Number of Stairs: 4  Height of Stairs: 6in  Comments: leading with R LE in both directions, attempts reciprocal pattern on ascent with sufficient strength, but having uncontrolled anterior lean during descent  GAIT: Gait pattern: step through pattern, decreased arm swing- Right, decreased arm swing- Left, decreased step length- Right, decreased step length- Left, decreased stride length, decreased hip/knee flexion- Right, decreased hip/knee flexion- Left, decreased ankle dorsiflexion- Right, decreased ankle dorsiflexion- Left, decreased trunk rotation, trunk flexed, poor foot clearance- Right, and poor foot clearance- Left Distance walked: 168ft Assistive device utilized: None Level of assistance: CGA Comments: significantly decreased  FUNCTIONAL TESTS:  5 times sit to stand: 38.53 seconds arms across chest Timed up and go (TUG): 19.46 seconds without AD 6 minute walk test: 10/04/2023: 545ft (159m at 0.19m/s avg) 10 meter walk test: 0.56 m/s without AD Mini-Best: 10/04/2023: 8/28  PATIENT SURVEYS:  ABC scale 17.5% with pt feeling <40% confident on all of the items, pt feels only 30% confident walking around the house, 10% confident getting in/out of car, and 20% reaching for items  OCULOMOTOR EXAM from 11/16/2023 - performed due to Neurology note reporting concerns of decreased vertical gaze:  Ocular Alignment: normal  Ocular ROM: No Limitations  Spontaneous Nystagmus: absent  Gaze-Induced Nystagmus:  absent  Smooth Pursuits: intact, good vertical movements with full range  Horizontal and Vertical Saccades: extra eye movements, slow, and frequently blinks, a few extra eye movements when moving to superior target                                                                                                                               TREATMENT DATE: 12/14/23    Unless otherwise stated, CGA was provided and gait belt donned in order to ensure pt safety throughout session.   TherAct:  Postural intervention - seated scapular retraction 10x with 3 sec hold/rep - cuing for increased amplitude of movement/speed, able to immediately correct with cuing  STS with arm-swing/large-amplitude movement and continued emphasis on upright posture 10x  Squat down pick up ball and stand up and throw into hoop x 25 balls.  Lateral step over theraband and back 10x each LE  6" step toe taps seated 30 seconds x 3 trials    Gait: Large-amplitude gait training: emphasis on tall posture and increased step-length 300 ft with use of SPC, able to correct with cuing, but technique does decrease with fatigue      Manual: Stm to R upper trap for shoulder tension reduction   PATIENT EDUCATION: Education details: exercise technique Person educated: Patient Education method: Explanation Education comprehension:  verbalized understanding and needs further education  HOME EXERCISE PROGRAM:  Access Code: [email or web address] URL: [email or web address] Date: 12/02/2023 Prepared by: Carlen Chasten  Exercises - Sidelying Thoracic Rotation with Open Book  - 1 x daily - 7 x weekly - 2 sets - 10 reps - Supine Shoulder External Rotation in Abduction  - 1 x daily - 7 x weekly - 2 sets - 10 reps - 10 seconds hold - Seated Scapular Retraction  - 1 x daily - 7 x weekly - 3 sets - 10 reps - 3 seconds hold - Sit to Stand with Arm Swing  - 1 x daily - 7 x weekly - 2 sets - 10 reps   GOALS: Goals reviewed with patient?  Yes  SHORT TERM GOALS: Target date: 11/09/2023    Patient will be independent in home exercise program to improve strength/mobility for better functional independence with ADLs. Baseline: need to initiate 10/07/2023: provided Goal status: IN PROGRESS   LONG TERM GOALS: Target date: 12/21/2023  1.  Patient (< 35 years old) will complete five times sit to stand test in < 12 seconds indicating an increased LE strength and improved balance. Baseline: 09/28/23: 38.53 seconds 11/09/2023: 22.13 seconds with arms across chest Goal status: IN PROGRESS   2. Patient will reduce timed up and go to <11 seconds to reduce fall risk and demonstrate improved transfer/gait ability. Baseline: 09/28/23: 19.46 seconds without AD 11/09/2023: 16.96 seconds, no AD, with close SBA for balance safety Goal status: IN PROGRESS  3.  Patient will increase 10 meter walk test to >1.63m/s as to improve gait speed for better community ambulation and to reduce fall risk. Baseline: 09/28/23: 0.27m/s without AD 11/09/2023: 0.89 m/s (11.50 seconds seconds 10.94 seconds) no AD, with close SBA for safety Goal status: IN PROGRESS  4.  Patient will increase six minute walk test distance to >1010ft for progression to community ambulator and improve gait ability Baseline: 10/04/2023: 52ft (151m at 0.53m/s avg) 11/09/2023: 1061 feet (323 meters, Avg speed 0.897 m/s) without AD Goal status: MET and Advanced 11/09/2023 Goal Updated: distance > 1353ft  5. Patient will increase MiniBest Test score to >18/28 to indicate a reduced risk for falling and demonstrate increased independence with functional mobility and ADLs.  Baseline: 10/04/2023: 8/28  11/16/2023: 16/28   Goal status: IN PROGRESS  6. Patient will increase ABC scale score >80% to demonstrate better functional mobility and better confidence with ADLs.   Baseline: 09/28/23: 17.5% 11/09/2023: 21.25% - pt reports he rated the test as is if he was not using his walker because this is his long  term goal   Goal status: IN PROGRESS   ASSESSMENT:  CLINICAL IMPRESSION:   Patient tolerates progressive sit to stand demand without LOB. Forward reach to pick up ball is challenging towards end as patient fatigues. Six inch step seated toe taps is very challenging for coordination with significant RLE fatigue this session indicating area of continued focus through future sessions. Patient is eager to progress his functional independent mobility as he wants to play with his grandkids.  Mr. Syzdek will benefit from continued skilled PT to improve these deficits in order to increase QOL and ease/safety with ADLs and functional mobility as well as decrease risk for falls.   OBJECTIVE IMPAIRMENTS: Abnormal gait, decreased activity tolerance, decreased balance, decreased coordination, decreased endurance, decreased knowledge of condition, decreased knowledge of use of DME, decreased mobility, difficulty walking, decreased ROM, decreased strength, decreased safety awareness, improper body mechanics, postural dysfunction, and pain.  ACTIVITY LIMITATIONS: carrying, lifting, bending, standing, squatting, sleeping, stairs, transfers, bed mobility, continence, bathing, toileting, dressing, reach over head, hygiene/grooming, and locomotion level  PARTICIPATION LIMITATIONS: meal prep, cleaning, laundry, and community activity  PERSONAL FACTORS: Fitness and 3+ comorbidities: Vitamin D deficiency, Hyperlipidemia, and HTN are also affecting patient's functional outcome.   REHAB POTENTIAL: Good  CLINICAL DECISION MAKING: Evolving/moderate complexity  EVALUATION COMPLEXITY: Moderate  PLAN:  PT FREQUENCY: 1-2x/week  PT DURATION: 12 weeks  PLANNED INTERVENTIONS: 97164- PT Re-evaluation, 97110-Therapeutic exercises, 97530- Therapeutic activity, 97112- Neuromuscular re-education, 97535- Self Care, 16109- Manual therapy, [phone]- Gait training, [phone]- Orthotic Fit/training, [phone]- Canalith repositioning, [phone]-  Electrical stimulation (manual), Patient/Family education, Balance training, Stair training, Joint mobilization, Vestibular training, Visual/preceptual remediation/compensation, DME instructions, Cryotherapy, and Moist heat  PLAN FOR NEXT SESSION: - review updated HEP  - gait training with increased intensity - dynamic gait with focus on increased upright posture and speed of ambulation with arm swing (add AWs to UEs with boxing gloves)  - add dual-task challenges of head rotations, stepping over obstacles, sudden turns, and changes in gait speed - stepping in different directions   - with dual-task of Blaze Pods to promote increased speed/amplitude of movements  - continue arm reaching and trunk rotation (specifically to R) - standing balance: narrow BOS/SLS, on compliant surfaces, incline, eyes open&eyes closed - stepping strategy re-training: focus on posterior stepping - large amplitude UE movements with promotion of improved upright posture - B LE strengthening (R>L)  - progress to floor transfers   Larua Collier  Brain Cahill, PT, DPT Physical Therapist - [facility]  Outpatient Physical Therapy- Main Campus [phone]    10:15 AM 12/14/23

## 2023-12-14 ENCOUNTER — Ambulatory Visit: Payer: 59 | Admitting: Occupational Therapy

## 2023-12-14 ENCOUNTER — Ambulatory Visit: Payer: 59

## 2023-12-14 DIAGNOSIS — R262 Difficulty in walking, not elsewhere classified: Secondary | ICD-10-CM

## 2023-12-14 DIAGNOSIS — R2681 Unsteadiness on feet: Secondary | ICD-10-CM

## 2023-12-14 DIAGNOSIS — M6281 Muscle weakness (generalized): Secondary | ICD-10-CM

## 2023-12-14 NOTE — Therapy (Signed)
 Occupational TherapyNeuro Treatment Note  Patient Name: Daniel Daugherty MRN: [id number] DOB:May 19, 1966, 58 y.o., male Today's Date: 12/14/2023  PCP: N/A REFERRING PROVIDER: Arlen Belton, MD  END OF SESSION:  OT End of Session - 12/14/23 1210     Visit Number 18    Number of Visits 24    Date for OT Re-Evaluation 12/21/23    OT Start Time 1020    OT Stop Time 1100    OT Time Calculation (min) 40 min    Activity Tolerance Patient tolerated treatment well    Behavior During Therapy WFL for tasks assessed/performed                 Past Medical History:  Diagnosis Date   Hypertension    Past Surgical History:  Procedure Laterality Date   DG HAND LEFT COMPLETE ([facility] HX) Left 04/13/1999   There are no active problems to display for this patient.   ONSET DATE: 05/2021  REFERRING DIAG: Parkinson's Disease  THERAPY DIAG:  Muscle weakness (generalized)  Rationale for Evaluation and Treatment: Rehabilitation  SUBJECTIVE:   SUBJECTIVE STATEMENT:  Pt. reports  having had a nice weekend. Pt accompanied by: self  PERTINENT HISTORY:   Pt. is a 58 y.o. male who was diagnosed with Parkinson's Disease in 2022. Pt. reports having had a progressive decline in function since having hernia surgery, and right shoulder rotator cuff surgery. PMHx included: HTN. Of note; Pt. was being followed by [facility], and receiving Home health therapy services. Pt. changed physicians, and was referred for outpatient rehab services here.  PRECAUTIONS: None  WEIGHT BEARING RESTRICTIONS: No  PAIN:  Are you having pain? 0/10  FALLS: Has patient fallen in last 6 months? No, 1 small slip near the bed tripped over shoes  LIVING ENVIRONMENT: Lives with: lives alone Lives in: [place],  Stairs: Entry level Has following equipment at home: Quad cane small base, rollator, shower chair, grab bars, hand rail at [employer]  PLOF: Independent  PATIENT GOALS: Improve self-care  OBJECTIVE:   Note: Objective measures were completed at Evaluation unless otherwise noted.  HAND DOMINANCE: Right  ADLs:  Assist at home:  Ex wife, and children assist several times a week  Transfers/ambulation related to ADLs: Modified Independent Eating: Difficulty using dominant right hand. Drops food when scooping, Both hands for stabilizing drinks, and uses a straw. Unable to cut food.  Grooming: Increased time required for all grooming tasks. UB Dressing: Difficulty managing buttons LB Dressing: Less assist with pants with elastic, more assist with dress pants Toileting: Increased time for hygiene. Bathing: Requires increased time to complete Tub Shower transfers: Modified Independence with increased time  IADLs: Shopping: Son assists with grocery shopping Light housekeeping: Son's assist. Pt. Does light rising off of dishes. Meal Prep: Increased time. Independent with microwave, and air fryer. Difficulty opening bottles, containers Community mobility:  Transportation, light driving to grocery store Medication management:  Son's set-up weekly pillbox, Pt. Is responsible for taking the medications Financial management: Son's assist with monthly finances Handwriting: 50% legible Hobbies: Dancing, watching sports Career/work: Restaurant manager, fast food  MOBILITY STATUS: Uses a cane; shuffling gait  FUNCTIONAL OUTCOME MEASURES:  TBD  UPPER EXTREMITY ROM:    Active ROM Right eval Right 11/09/23 Left Eval [facility] overall  Shoulder flexion WFL 120(134)   Shoulder abduction 52(100) 60(110)   Shoulder adduction     Shoulder extension     Shoulder internal rotation     Shoulder external rotation     Elbow  flexion [facility] WFL   Elbow extension [facility]   Wrist flexion     Wrist extension [facility]   Wrist ulnar deviation     Wrist radial deviation     Wrist pronation     Wrist supination     (Blank rows = not tested)  UPPER EXTREMITY MMT:     MMT Right eval Right 11/09/23  Left Eval 4-/5 overall Left 11/09/23 4/5 Overall  Shoulder flexion 3/5 3-/5    Shoulder abduction 2/5 2/5    Shoulder adduction      Shoulder extension      Shoulder internal rotation      Shoulder external rotation      Middle trapezius      Lower trapezius      Elbow flexion 3/5 3+/5    Elbow extension 3/5 3+5    Wrist flexion      Wrist extension 3/5 3/5    Wrist ulnar deviation      Wrist radial deviation      Wrist pronation      Wrist supination      (Blank rows = not tested)  HAND FUNCTION:   Eval: Grip strength: Right: 50 lbs; Left: 34 lbs, Lateral pinch: Right: 17 lbs, Left: 9 lbs, 3 point pinch: Right: 12 lbs, Left: 11 lbs  11/09/23: Grip strength: Right: 60 lbs; Left: 35 lbs, Lateral pinch: Right: 19 lbs, Left: 11 lbs, 3 point pinch: Right: 13 lbs, Left: 9 lbs  COORDINATION:  Eval: 9 Hole Peg test: Right: 1 min. & 46 sec; Left: 53 sec  11/09/2023: 9 Hole Peg test: Right: 1 min. & 22 sec; Left: 46 sec   SENSATION:  Light touch: WFL Proprioception: WFL  EDEMA: N/A  COGNITION: Overall cognitive status: Within functional limits for tasks assessed  VISION:  Does not wear glasses.  VISION ASSESSMENT: To be further assessed in functional context  PERCEPTION: WFL  PRAXIS: Impaired: Motor planning                                                                                                                         TREATMENT DATE: 12/14/23  Therapeutic Ex:   -A/AAROM for the RUE through all joint ranges of motion following manual therapy. -UE strengthening through reciprocal motion using the SciFit for 10 min. at resistance level 4.0 with constant monitoring of the BUEs.   -Progressive gross grip strengthening with 11.2# of grip strength resistive force.  Manual Therapy:   -Soft tissue massage was performed to the scapular, and UE musculature 2/2 tightness -Pt. tolerated scapular mobilization for elevation, depression, and abduction/rotation in sitting  to normalize tone, decrease tightness, and prepare the RUE ROM, strengthening, and functional use.   -Manual therapy was performed independent of, and in preparation for therapeutic Ex.    Therapeutic Activities:   -Lateral, and 3pt. pinch strengthening using yellow, red, and green level resistive clips  -Incorporated reaching in multiple planes to further challenge the task.   -  Facilitated translatory movements moving clips from the lateral pinch position to the 3pt. Pinch position in preparation for securely placing them on the dowel.   PATIENT EDUCATION: Education details: UE ROM, ADL functional status Person educated: Patient Education method: Explanation, Demonstration, Tactile cues, and Verbal cues Education comprehension: needs further education  HOME EXERCISE PROGRAM:  Continue to assess, and provide as indicated.   GOALS: Goals reviewed with patient? Yes  SHORT TERM GOALS: Target date: 11/09/2023    Pt. Will be independent with HEPs for RUE. Baseline: 11/09/2023: Independent Eval: No current HEP Goal status:Ongoing  LONG TERM GOALS: Target date: 12/21/2023  1.  Pt. Will increase right shoulder abduction by 10 degrees to assist with UE dressing Baseline: 11/09/23: Right 60/(110) Eval: Right 52(100) Goal status: Ongoing  2.  Pt. Will increase BUE strength by 2 mm grades to assist with ADLs. Baseline: 11/09/23: Right: shoulder flexion: 3-/5, abduction: 2/5, elbow flexion/extension: 3+/5, wrist extension: 3/5. Left: 4/5 overallEval: Right: shoulder flexion: 3/5, abduction: 2/5, elbow flexion/extension: 3/5, wrist extension: 3/5. Left: 4-/5 overall Goal status: Ongoing  3.  Pt. Will perform self-feeding with modified independence Baseline: 11/09/23: Pt. Continues to have difficulty using dominant right hand. Drops food when scooping, Both hands for stabilizing drinks, and uses a straw. Unable to cut food. Eval: Difficulty using dominant right hand. Drops food when scooping, Both  hands for stabilizing drinks, and uses a straw. Unable to cut food. Goal status: Ongoing  4.  Pt. Will demonstrate independence with proper A/E techniques/compensatory strategies for ADL/IADLs.  Baseline: 11/09/23: Continue Eval: Education to be provided Goal status: Ongoing  5.  Pt. Will write a sentence with 75% legibility with modified independence Baseline: 11/09/2023: 50% legibility for one sentence. Eval: 50% legibility for one printed sentence Goal status: Ongoing    ASSESSMENT:  CLINICAL IMPRESSION:  Pt. continues to present with significantly less tone, and tightness through the right shoulder, RUE, and hand today. Pt. tolerated increased work level from 2.0 to 4.0 on the SCiFit today. Pt.'s right hand fatigues with grip strengthening. Pt. presents with difficulty attempting to perform translatory movements with the the right hand. Pt. continues to benefit from OT services to work on improving overall bilateral UE functioning in order to maximize engagement in, and efficiency in ADLs, and IADL tasks, and provide education about compensatory strategies.     PERFORMANCE DEFICITS: in functional skills including ADLs, IADLs, coordination, dexterity, proprioception, sensation, ROM, strength, pain, Fine motor control, Gross motor control, and UE functional use, cognitive skills including , and psychosocial skills including coping strategies, environmental adaptation, and routines and behaviors.   IMPAIRMENTS: are limiting patient from ADLs, IADLs, and leisure.   CO-MORBIDITIES: may have co-morbidities  that affects occupational performance. Patient will benefit from skilled OT to address above impairments and improve overall function.  MODIFICATION OR ASSISTANCE TO COMPLETE EVALUATION: Min-Moderate modification of tasks or assist with assess necessary to complete an evaluation.  OT OCCUPATIONAL PROFILE AND HISTORY: Detailed assessment: Review of records and additional review of physical,  cognitive, psychosocial history related to current functional performance.  CLINICAL DECISION MAKING: Moderate - several treatment options, min-mod task modification necessary  REHAB POTENTIAL: Good  EVALUATION COMPLEXITY: Moderate    PLAN:  OT FREQUENCY: 2x/week  OT DURATION: 12 weeks  PLANNED INTERVENTIONS: 97168 OT Re-evaluation, 97535 self care/ADL training, 16109 therapeutic exercise, 97530 therapeutic activity, 97112 neuromuscular re-education, 97140 manual therapy, 97018 paraffin, 60454 moist heat, 97034 contrast bath, 97760 Orthotics management and training, 09811 Splinting (initial encounter), energy  conservation, patient/family education, and DME and/or AE instructions  RECOMMENDED OTHER SERVICES:   PT  CONSULTED AND AGREED WITH PLAN OF CARE: Patient  PLAN FOR NEXT SESSION:   Treatment  Jaynie Hitch, MS, OTR/L   12/14/2023, 12:13 PM

## 2023-12-16 ENCOUNTER — Ambulatory Visit: Payer: 59 | Admitting: Physical Therapy

## 2023-12-16 ENCOUNTER — Ambulatory Visit: Payer: 59 | Admitting: Occupational Therapy

## 2023-12-16 DIAGNOSIS — R278 Other lack of coordination: Secondary | ICD-10-CM

## 2023-12-16 DIAGNOSIS — R2681 Unsteadiness on feet: Secondary | ICD-10-CM

## 2023-12-16 DIAGNOSIS — M6281 Muscle weakness (generalized): Secondary | ICD-10-CM | POA: Diagnosis not present

## 2023-12-16 DIAGNOSIS — R531 Weakness: Secondary | ICD-10-CM

## 2023-12-16 DIAGNOSIS — R269 Unspecified abnormalities of gait and mobility: Secondary | ICD-10-CM

## 2023-12-16 DIAGNOSIS — R262 Difficulty in walking, not elsewhere classified: Secondary | ICD-10-CM

## 2023-12-16 NOTE — Therapy (Signed)
 OUTPATIENT PHYSICAL THERAPY NEURO TREATMENT   Patient Name: Daniel Daugherty MRN: [id number] DOB:Dec 25, 1965, 58 y.o., male Today's Date: 12/16/2023   PCP: Tawnya Fava, Waddell Guess, MD  REFERRING PROVIDER: Tawnya Fava, Waddell Guess, MD   END OF SESSION: ***   PT End of Session - 12/16/23 0939     Visit Number 18    Number of Visits 24    Date for PT Re-Evaluation 12/21/23    Authorization Type Medicare 2025    PT Start Time 0935    PT Stop Time 1015    PT Time Calculation (min) 40 min    Equipment Utilized During Treatment Gait belt    Activity Tolerance Patient tolerated treatment well    Behavior During Therapy WFL for tasks assessed/performed             Past Medical History:  Diagnosis Date   Hypertension    Past Surgical History:  Procedure Laterality Date   DG HAND LEFT COMPLETE ([facility] HX) Left 04/13/1999   There are no active problems to display for this patient.   ONSET DATE: 2022 is when he noticed the change and that was around the same time he was diagnosed with Parkinson's  REFERRING DIAG:  G20.A1 (ICD-10-CM) - Parkinson disease (HCC)  Z74.1 (ICD-10-CM) - Requires daily assistance for activities of daily living (ADL) and comfort needs   THERAPY DIAG:  Muscle weakness (generalized)  Difficulty in walking, not elsewhere classified  Unsteadiness on feet  Other lack of coordination  Abnormality of gait  Generalized weakness  Rationale for Evaluation and Treatment: Rehabilitation  SUBJECTIVE:                                                                                                                                                                                             SUBJECTIVE STATEMENT:  Pt states "it's just one of them days...ya know where you don't want to be bothered." Pt states "I feel like I'm trying to get better, but I'm just not going anywhere." ***   Denies stumbles/falls since last session. Pt states he is irritated because he  wants his old lifestyle back. ***   Call neurologist ***    Pt accompanied by: self  PERTINENT HISTORY: Parkinsonism, Vitamin D deficiency, Hyperlipidemia, HTN, poor medication compliance (per chart review)  PAIN:  Are you having pain? Yes: NPRS scale: 5/10, but states "it ain't that bad" Pain location: R shoulder and low back Pain description: throbbing in shoulder Aggravating factors: overhead movements or reaching back Relieving factors: rest and at home uses a "massager"  PRECAUTIONS: Fall  RED FLAGS: None  WEIGHT BEARING RESTRICTIONS: No  FALLS: Has patient fallen in last 6 months? Yes. 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   PATIENT GOALS: Get my balance right with my walking, help get my R arm stronger and be able to move it more, improve strength in my legs (specifically the Right)  OBJECTIVE:  Note: Objective measures were completed at Evaluation unless otherwise noted.  DIAGNOSTIC FINDINGS: N/A  COGNITION: Overall cognitive status: Within functional limits for tasks assessed; however, did not assess higher level cognitive tasks   SENSATION: WFL Light touch on screen Pt reports R foot falls asleep sitting on toilet and he has difficult time getting feeling back in it before feeling safe standing up.  COORDINATION: Overall bradykinesia with decreased speed and amplitude of  movements  EDEMA:  None present  MUSCLE TONE: Not formally assessed  MUSCLE LENGTH: Not formally assessed, but may have some hamstring tightness on R associated with quad weakness and inability to achieve full knee extension in sitting  DTRs:  Not assessed  POSTURE: rounded shoulders, forward head, increased thoracic kyphosis, posterior pelvic tilt, and flexed trunk    LOWER EXTREMITY ROM:     Active ROM Right Eval Left Eval  Hip flexion Decreased in sitting compared to L   Hip extension    Hip abduction    Hip adduction    Hip internal rotation    Hip external rotation    Knee flexion    Knee extension Lacks full knee extension in sitting   Ankle dorsiflexion Limited in sitting Limited in sitting  Ankle plantarflexion    Ankle inversion    Ankle eversion     (Blank rows = not tested)  LOWER EXTREMITY MMT:    MMT Right Eval Left Eval  Hip flexion 3- 4+  Hip extension    Hip abduction    Hip adduction    Hip internal rotation    Hip external rotation    Knee flexion 3 4+  Knee extension 3- 4+  Ankle dorsiflexion 3- 4+  Ankle plantarflexion 3- 4+  Ankle inversion    Ankle eversion    (Blank rows = not tested)  Manual Muscle Test Scale 0/5 = No muscle contraction can be seen or felt 1/5 = Contraction can be felt, but there is no motion 2-/5 = Part moves through incomplete ROM w/ gravity decreased 2/5 = Part moves through complete ROM w/ gravity decreased 2+/5 = Part moves through incomplete ROM (<50%) against gravity or through complete ROM w/ gravity 3-/5 = Part moves through incomplete ROM (>50%) against gravity 3/5 = Part moves through complete ROM against gravity 3+/5 = Part moves through complete ROM against gravity/slight resistance 4-/5= Holds test position against slight to moderate pressure 4/5 = Part moves through complete ROM against gravity/moderate resistance 4+/5= Holds test position against moderate to strong pressure 5/5 = Part moves  through complete ROM against gravity/full resistance  BED MOBILITY:  Sit to supine   Supine  to sit   Need to assess  ADL: Reports dificulty getitng on R shoe, specifically having trouble pushing his foot down in his shoe  TRANSFERS: Assistive device utilized: Hurry cane  Sit to stand: Modified independence and SBA Stand to sit: Modified independence and SBA Chair to chair: Modified independence and SBA Floor: would benefit from testing in future  RAMP:  Level of Assistance:   Assistive device utilized: Hurry cane Ramp Comments: would benefit from assessing  CURB:  Level of Assistance:   Assistive device utilized: [employer] Comments: would benefit from assessing  STAIRS: Level of Assistance: CGA and Min A Stair Negotiation Technique: Step to Pattern Forwards with Bilateral Rails Number of Stairs: 4  Height of Stairs: 6in  Comments: leading with R LE in both directions, attempts reciprocal pattern on ascent with sufficient strength, but having uncontrolled anterior lean during descent  GAIT: Gait pattern: step through pattern, decreased arm swing- Right, decreased arm swing- Left, decreased step length- Right, decreased step length- Left, decreased stride length, decreased hip/knee flexion- Right, decreased hip/knee flexion- Left, decreased ankle dorsiflexion- Right, decreased ankle dorsiflexion- Left, decreased trunk rotation, trunk flexed, poor foot clearance- Right, and poor foot clearance- Left Distance walked: 16ft Assistive device utilized: None Level of assistance: CGA Comments: significantly decreased  FUNCTIONAL TESTS:  5 times sit to stand: 38.53 seconds arms across chest Timed up and go (TUG): 19.46 seconds without AD 6 minute walk test: 10/04/2023: 563ft (153m at 0.46m/s avg) 10 meter walk test: 0.56 m/s without AD Mini-Best: 10/04/2023: 8/28  PATIENT SURVEYS:  ABC scale 17.5% with pt feeling <40% confident on all of the items, pt feels only 30% confident  walking around the house, 10% confident getting in/out of car, and 20% reaching for items  OCULOMOTOR EXAM from 11/16/2023 - performed due to Neurology note reporting concerns of decreased vertical gaze:  Ocular Alignment: normal  Ocular ROM: No Limitations  Spontaneous Nystagmus: absent  Gaze-Induced Nystagmus: absent  Smooth Pursuits: intact, good vertical movements with full range  Horizontal and Vertical Saccades: extra eye movements, slow, and frequently blinks, a few extra eye movements when moving to superior target                                                                                                                               TREATMENT DATE: 12/16/23   ***  Unless otherwise stated, CGA was provided and gait belt donned in order to ensure pt safety throughout session.  *** Therapist providing pt encouragement on his progress thus far by reviewing his progress made from initial eval to 1st progress note below. ***  76bpm ***   Dynamic gait training for 5 minutes achieves *** ~520ft with focus on fast forwards walking to achieve reciprocal stepping pattern with increased arm swing using boxing gloves with external targets - pt does excellent identifying if he is not moving contralateral limbs together - continues to have excessive forward trunk flexion  with thoracic kyphosis and forward head  Therapist stays close for SBA with 2x providing light min A for safety due to minor posterior LOB Pt with a few instances of freezing when turning while trying to maintain boxing of external targets  ***  Circuit training focusing on improved transfer and upright posture:  45sec sit<>stand Could have increased to 60 seconds  20sec rest 45 sec standing rows against 2.5lbs using matrix cable machine Maybe could go up ~2lbs using AW next time 20sec rest REPEAT the above   HR elevates to 91bpm, quickly decreased to 74bpm    *** Gait training ~178ft to OT room, no AD, with  continued focus on carryover of improved step lengths, upright posture, and arm swing with pt demoing good carryover.      PATIENT EDUCATION: Education details: exercise technique Person educated: Patient Education method: Explanation Education comprehension: verbalized understanding and needs further education  HOME EXERCISE PROGRAM:  Access Code: [email or web address] URL: [email or web address] Date: 12/02/2023 Prepared by: Carlen Chasten  Exercises - Sidelying Thoracic Rotation with Open Book  - 1 x daily - 7 x weekly - 2 sets - 10 reps - Supine Shoulder External Rotation in Abduction  - 1 x daily - 7 x weekly - 2 sets - 10 reps - 10 seconds hold - Seated Scapular Retraction  - 1 x daily - 7 x weekly - 3 sets - 10 reps - 3 seconds hold - Sit to Stand with Arm Swing  - 1 x daily - 7 x weekly - 2 sets - 10 reps   GOALS: Goals reviewed with patient? Yes  SHORT TERM GOALS: Target date: 11/09/2023    Patient will be independent in home exercise program to improve strength/mobility for better functional independence with ADLs. Baseline: need to initiate 10/07/2023: provided Goal status: IN PROGRESS   LONG TERM GOALS: Target date: 12/21/2023  1.  Patient (< 21 years old) will complete five times sit to stand test in < 12 seconds indicating an increased LE strength and improved balance. Baseline: 09/28/23: 38.53 seconds 11/09/2023: 22.13 seconds with arms across chest Goal status: IN PROGRESS   2. Patient will reduce timed up and go to <11 seconds to reduce fall risk and demonstrate improved transfer/gait ability. Baseline: 09/28/23: 19.46 seconds without AD 11/09/2023: 16.96 seconds, no AD, with close SBA for balance safety Goal status: IN PROGRESS  3.  Patient will increase 10 meter walk test to >1.31m/s as to improve gait speed for better community ambulation and to reduce fall risk. Baseline: 09/28/23: 0.43m/s without AD 11/09/2023: 0.89 m/s (11.50 seconds seconds 10.94 seconds) no  AD, with close SBA for safety Goal status: IN PROGRESS  4.  Patient will increase six minute walk test distance to >1070ft for progression to community ambulator and improve gait ability Baseline: 10/04/2023: 546ft (162m at 0.28m/s avg) 11/09/2023: 1061 feet (323 meters, Avg speed 0.897 m/s) without AD Goal status: MET and Advanced 11/09/2023 Goal Updated: distance > 1325ft  5. Patient will increase MiniBest Test score to >18/28 to indicate a reduced risk for falling and demonstrate increased independence with functional mobility and ADLs.  Baseline: 10/04/2023: 8/28  11/16/2023: 16/28   Goal status: IN PROGRESS  6. Patient will increase ABC scale score >80% to demonstrate better functional mobility and better confidence with ADLs.   Baseline: 09/28/23: 17.5% 11/09/2023: 21.25% - pt reports he rated the test as is if he was not using his walker because this is his long term goal   Goal  status: IN PROGRESS   ASSESSMENT:  CLINICAL IMPRESSION:  *** Patient tolerates progressive sit to stand demand without LOB. Forward reach to pick up ball is challenging towards end as patient fatigues. Six inch step seated toe taps is very challenging for coordination with significant RLE fatigue this session indicating area of continued focus through future sessions. Patient is eager to progress his functional independent mobility as he wants to play with his grandkids.  Mr. Mesner will benefit from continued skilled PT to improve these deficits in order to increase QOL and ease/safety with ADLs and functional mobility as well as decrease risk for falls.   OBJECTIVE IMPAIRMENTS: Abnormal gait, decreased activity tolerance, decreased balance, decreased coordination, decreased endurance, decreased knowledge of condition, decreased knowledge of use of DME, decreased mobility, difficulty walking, decreased ROM, decreased strength, decreased safety awareness, improper body mechanics, postural dysfunction, and pain.   ACTIVITY  LIMITATIONS: carrying, lifting, bending, standing, squatting, sleeping, stairs, transfers, bed mobility, continence, bathing, toileting, dressing, reach over head, hygiene/grooming, and locomotion level  PARTICIPATION LIMITATIONS: meal prep, cleaning, laundry, and community activity  PERSONAL FACTORS: Fitness and 3+ comorbidities: Vitamin D deficiency, Hyperlipidemia, and HTN are also affecting patient's functional outcome.   REHAB POTENTIAL: Good  CLINICAL DECISION MAKING: Evolving/moderate complexity  EVALUATION COMPLEXITY: Moderate  PLAN:  PT FREQUENCY: 1-2x/week  PT DURATION: 12 weeks  PLANNED INTERVENTIONS: 97164- PT Re-evaluation, 97110-Therapeutic exercises, 97530- Therapeutic activity, 97112- Neuromuscular re-education, 97535- Self Care, 21308- Manual therapy, [phone]- Gait training, [phone]- Orthotic Fit/training, [phone]- Canalith repositioning, [phone]- Electrical stimulation (manual), Patient/Family education, Balance training, Stair training, Joint mobilization, Vestibular training, Visual/preceptual remediation/compensation, DME instructions, Cryotherapy, and Moist heat  PLAN FOR NEXT SESSION: *** - review updated HEP  - gait training with increased intensity - dynamic gait with focus on increased upright posture and speed of ambulation with arm swing (add AWs to UEs with boxing gloves)  - add dual-task challenges of head rotations, stepping over obstacles, sudden turns, and changes in gait speed - stepping in different directions   - with dual-task of Blaze Pods to promote increased speed/amplitude of movements  - continue arm reaching and trunk rotation (specifically to R) - standing balance: narrow BOS/SLS, on compliant surfaces, incline, eyes open&eyes closed - stepping strategy re-training: focus on posterior stepping - large amplitude UE movements with promotion of improved upright posture - B LE strengthening (R>L)  - progress to floor transfers   Carlen Chasten, PT,  DPT, NCS, CSRS Physical Therapist - [facility]  [facility]  10:18 AM 12/16/23

## 2023-12-16 NOTE — Therapy (Signed)
 Occupational TherapyNeuro Treatment Note  Patient Name: Daniel Daugherty MRN: [id number] DOB:1966/02/21, 58 y.o., male Today's Date: 12/16/2023  PCP: N/A REFERRING PROVIDER: Arlen Belton, MD  END OF SESSION:  OT End of Session - 12/16/23 1623     Visit Number 19    Number of Visits 24    Date for OT Re-Evaluation 12/21/23    OT Start Time 1015    OT Stop Time 1100    OT Time Calculation (min) 45 min    Activity Tolerance Patient tolerated treatment well    Behavior During Therapy WFL for tasks assessed/performed                 Past Medical History:  Diagnosis Date   Hypertension    Past Surgical History:  Procedure Laterality Date   DG HAND LEFT COMPLETE ([facility] HX) Left 04/13/1999   There are no active problems to display for this patient.   ONSET DATE: 05/2021  REFERRING DIAG: Parkinson's Disease  THERAPY DIAG:  Muscle weakness (generalized)  Other lack of coordination  Rationale for Evaluation and Treatment: Rehabilitation  SUBJECTIVE:   SUBJECTIVE STATEMENT:  Pt. reports having had a nice weekend. Pt accompanied by: self  PERTINENT HISTORY:   Pt. is a 58 y.o. male who was diagnosed with Parkinson's Disease in 2022. Pt. reports having had a progressive decline in function since having hernia surgery, and right shoulder rotator cuff surgery. PMHx included: HTN. Of note; Pt. was being followed by [facility], and receiving Home health therapy services. Pt. changed physicians, and was referred for outpatient rehab services here.  PRECAUTIONS: None  WEIGHT BEARING RESTRICTIONS: No  PAIN:  Are you having pain? 0/10  FALLS: Has patient fallen in last 6 months? No, 1 small slip near the bed tripped over shoes  LIVING ENVIRONMENT: Lives with: lives alone Lives in: [place],  Stairs: Entry level Has following equipment at home: Quad cane small base, rollator, shower chair, grab bars, hand rail at [employer]  PLOF: Independent  PATIENT GOALS: Improve  self-care  OBJECTIVE:  Note: Objective measures were completed at Evaluation unless otherwise noted.  HAND DOMINANCE: Right  ADLs:  Assist at home:  Ex wife, and children assist several times a week  Transfers/ambulation related to ADLs: Modified Independent Eating: Difficulty using dominant right hand. Drops food when scooping, Both hands for stabilizing drinks, and uses a straw. Unable to cut food.  Grooming: Increased time required for all grooming tasks. UB Dressing: Difficulty managing buttons LB Dressing: Less assist with pants with elastic, more assist with dress pants Toileting: Increased time for hygiene. Bathing: Requires increased time to complete Tub Shower transfers: Modified Independence with increased time  IADLs: Shopping: Son assists with grocery shopping Light housekeeping: Son's assist. Pt. Does light rising off of dishes. Meal Prep: Increased time. Independent with microwave, and air fryer. Difficulty opening bottles, containers Community mobility:  Transportation, light driving to grocery store Medication management:  Son's set-up weekly pillbox, Pt. Is responsible for taking the medications Financial management: Son's assist with monthly finances Handwriting: 50% legible Hobbies: Dancing, watching sports Career/work: Restaurant manager, fast food  MOBILITY STATUS: Uses a cane; shuffling gait  FUNCTIONAL OUTCOME MEASURES:  TBD  UPPER EXTREMITY ROM:    Active ROM Right eval Right 11/09/23 Left Eval [facility] overall  Shoulder flexion WFL 120(134)   Shoulder abduction 52(100) 60(110)   Shoulder adduction     Shoulder extension     Shoulder internal rotation     Shoulder external rotation  Elbow flexion [facility] WFL   Elbow extension [facility]   Wrist flexion     Wrist extension [facility]   Wrist ulnar deviation     Wrist radial deviation     Wrist pronation     Wrist supination     (Blank rows = not tested)  UPPER EXTREMITY MMT:     MMT Right eval  Right 11/09/23 Left Eval 4-/5 overall Left 11/09/23 4/5 Overall  Shoulder flexion 3/5 3-/5    Shoulder abduction 2/5 2/5    Shoulder adduction      Shoulder extension      Shoulder internal rotation      Shoulder external rotation      Middle trapezius      Lower trapezius      Elbow flexion 3/5 3+/5    Elbow extension 3/5 3+5    Wrist flexion      Wrist extension 3/5 3/5    Wrist ulnar deviation      Wrist radial deviation      Wrist pronation      Wrist supination      (Blank rows = not tested)  HAND FUNCTION:   Eval: Grip strength: Right: 50 lbs; Left: 34 lbs, Lateral pinch: Right: 17 lbs, Left: 9 lbs, 3 point pinch: Right: 12 lbs, Left: 11 lbs  11/09/23: Grip strength: Right: 60 lbs; Left: 35 lbs, Lateral pinch: Right: 19 lbs, Left: 11 lbs, 3 point pinch: Right: 13 lbs, Left: 9 lbs  COORDINATION:  Eval: 9 Hole Peg test: Right: 1 min. & 46 sec; Left: 53 sec  11/09/2023: 9 Hole Peg test: Right: 1 min. & 22 sec; Left: 46 sec   SENSATION:  Light touch: WFL Proprioception: WFL  EDEMA: N/A  COGNITION: Overall cognitive status: Within functional limits for tasks assessed  VISION:  Does not wear glasses.  VISION ASSESSMENT: To be further assessed in functional context  PERCEPTION: WFL  PRAXIS: Impaired: Motor planning                                                                                                                         TREATMENT DATE: 12/16/23  Therapeutic Ex:   -UE strengthening through reciprocal motion using the SciFit for 10 min. at resistance level 4.0 with constant monitoring of the BUEs. -Pectoral stretches were performed in sitting. -Education was provided about home pectoral stretches in standing at the wall.    Therapeutic Activities:   -Combinations of movement were performed  including: gross grip strengthening with 17.9# of grip strength resistive force, sustaining grip strength while reaching to place the pegs into a  container  at at various distances encouraging scapular protraction, shoulder flexion, shoulder abduction, and elbow extension. -translatory movements were performed to set the pegs up into a pegboard.  PATIENT EDUCATION: Education details: UE ROM, ADL functional status Person educated: Patient Education method: Explanation, Demonstration, Tactile cues, and Verbal cues Education comprehension: needs further education  HOME EXERCISE PROGRAM:  Continue to assess, and provide as indicated.   GOALS: Goals reviewed with patient? Yes  SHORT TERM GOALS: Target date: 11/09/2023    Pt. Will be independent with HEPs for RUE. Baseline: 11/09/2023: Independent Eval: No current HEP Goal status:Ongoing  LONG TERM GOALS: Target date: 12/21/2023  1.  Pt. Will increase right shoulder abduction by 10 degrees to assist with UE dressing Baseline: 11/09/23: Right 60/(110) Eval: Right 52(100) Goal status: Ongoing  2.  Pt. Will increase BUE strength by 2 mm grades to assist with ADLs. Baseline: 11/09/23: Right: shoulder flexion: 3-/5, abduction: 2/5, elbow flexion/extension: 3+/5, wrist extension: 3/5. Left: 4/5 overallEval: Right: shoulder flexion: 3/5, abduction: 2/5, elbow flexion/extension: 3/5, wrist extension: 3/5. Left: 4-/5 overall Goal status: Ongoing  3.  Pt. Will perform self-feeding with modified independence Baseline: 11/09/23: Pt. Continues to have difficulty using dominant right hand. Drops food when scooping, Both hands for stabilizing drinks, and uses a straw. Unable to cut food. Eval: Difficulty using dominant right hand. Drops food when scooping, Both hands for stabilizing drinks, and uses a straw. Unable to cut food. Goal status: Ongoing  4.  Pt. Will demonstrate independence with proper A/E techniques/compensatory strategies for ADL/IADLs.  Baseline: 11/09/23: Continue Eval: Education to be provided Goal status: Ongoing  5.  Pt. Will write a sentence with 75% legibility with modified  independence Baseline: 11/09/2023: 50% legibility for one sentence. Eval: 50% legibility for one printed sentence Goal status: Ongoing    ASSESSMENT:  CLINICAL IMPRESSION:  Pt. continues to present with significantly less tone, and tightness through the right shoulder, RUE, and hand today. Pt. tolerated the SciFit at level 4.0 for the entire duration on the SCiFit today. Pt.'s required rest breaks with grip strengthening, and required increased time with cues to perform translatory movements when setting the pegs up vertically in the pegboard. Pt. continues to present with difficulty attempting to perform translatory movements with the right hand. Pt. presented with difficulty when reaching to place the pegs into the container at a distance. Pt. was able to complete the task with the container closer to the board more efficiently. Pt. continues to benefit from OT services to work on improving overall bilateral UE functioning in order to maximize engagement in, and efficiency in ADLs, and IADL tasks, and provide education about compensatory strategies.    PERFORMANCE DEFICITS: in functional skills including ADLs, IADLs, coordination, dexterity, proprioception, sensation, ROM, strength, pain, Fine motor control, Gross motor control, and UE functional use, cognitive skills including , and psychosocial skills including coping strategies, environmental adaptation, and routines and behaviors.   IMPAIRMENTS: are limiting patient from ADLs, IADLs, and leisure.   CO-MORBIDITIES: may have co-morbidities  that affects occupational performance. Patient will benefit from skilled OT to address above impairments and improve overall function.  MODIFICATION OR ASSISTANCE TO COMPLETE EVALUATION: Min-Moderate modification of tasks or assist with assess necessary to complete an evaluation.  OT OCCUPATIONAL PROFILE AND HISTORY: Detailed assessment: Review of records and additional review of physical, cognitive,  psychosocial history related to current functional performance.  CLINICAL DECISION MAKING: Moderate - several treatment options, min-mod task modification necessary  REHAB POTENTIAL: Good  EVALUATION COMPLEXITY: Moderate    PLAN:  OT FREQUENCY: 2x/week  OT DURATION: 12 weeks  PLANNED INTERVENTIONS: 97168 OT Re-evaluation, 97535 self care/ADL training, 96045 therapeutic exercise, 97530 therapeutic activity, 97112 neuromuscular re-education, 97140 manual therapy, 97018 paraffin, 40981 moist heat, 97034 contrast bath, 97760 Orthotics management and training, 19147 Splinting (initial encounter), energy conservation, patient/family education, and  DME and/or AE instructions  RECOMMENDED OTHER SERVICES:   PT  CONSULTED AND AGREED WITH PLAN OF CARE: Patient  PLAN FOR NEXT SESSION:   Treatment  Shriyan Arakawa, MS, OTR/L   12/16/2023, 4:26 PM

## 2023-12-20 ENCOUNTER — Telehealth: Payer: Self-pay | Admitting: Physical Therapy

## 2023-12-20 ENCOUNTER — Ambulatory Visit: Admitting: Physical Therapy

## 2023-12-20 ENCOUNTER — Ambulatory Visit

## 2023-12-20 NOTE — Therapy (Incomplete)
 OUTPATIENT PHYSICAL THERAPY NEURO TREATMENT   Patient Name: Daniel Daugherty MRN: [id number] DOB:01-20-1966, 58 y.o., male Today's Date: 12/20/2023   PCP: Tawnya Fava, Waddell Guess, MD  REFERRING PROVIDER: Tawnya Fava, Waddell Guess, MD   END OF SESSION:  ***    Past Medical History:  Diagnosis Date   Hypertension    Past Surgical History:  Procedure Laterality Date   DG HAND LEFT COMPLETE ([facility] HX) Left 04/13/1999   There are no active problems to display for this patient.   ONSET DATE: 2022 is when he noticed the change and that was around the same time he was diagnosed with Parkinson's  REFERRING DIAG:  G20.A1 (ICD-10-CM) - Parkinson disease (HCC)  Z74.1 (ICD-10-CM) - Requires daily assistance for activities of daily living (ADL) and comfort needs   THERAPY DIAG: *** No diagnosis found.  Rationale for Evaluation and Treatment: Rehabilitation  SUBJECTIVE:                                                                                                                                                                                             SUBJECTIVE STATEMENT:  ***  Pt states "it's just one of them days...ya know where you don't want to be bothered." Pt states "I feel like I'm trying to get better, but I'm just not going anywhere."    Denies stumbles/falls since last session. Pt states he is irritated because he wants his old lifestyle back.   ***  Pt accompanied by: self  PERTINENT HISTORY: Parkinsonism, Vitamin D deficiency, Hyperlipidemia, HTN, poor medication compliance (per chart review)  PAIN:  Are you having pain? Yes: NPRS scale: 5/10, but states "it ain't that bad" Pain location: R shoulder and low back Pain description: throbbing in shoulder Aggravating factors: overhead movements or reaching back Relieving factors: rest and at home uses a "massager"  PRECAUTIONS: Fall  RED FLAGS: None   WEIGHT BEARING RESTRICTIONS: No  FALLS: Has patient fallen  in last 6 months? Yes. 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   PATIENT GOALS: Get my balance right with my walking, help  get my R arm stronger and be able to move it more, improve strength in my legs (specifically the Right)  OBJECTIVE:  Note: Objective measures were completed at Evaluation unless otherwise noted.  DIAGNOSTIC FINDINGS: N/A  COGNITION: Overall cognitive status: Within functional limits for tasks assessed; however, did not assess higher level cognitive tasks   SENSATION: WFL Light touch on screen Pt reports R foot falls asleep sitting on toilet and he has difficult time getting feeling back in it before feeling safe standing up.  COORDINATION: Overall bradykinesia with decreased speed and amplitude of movements  EDEMA:  None present  MUSCLE TONE: Not formally assessed  MUSCLE LENGTH: Not formally assessed, but may have some hamstring tightness on R associated with quad weakness and inability to achieve full knee extension in sitting  DTRs:  Not assessed  POSTURE: rounded shoulders, forward head, increased thoracic kyphosis, posterior pelvic tilt, and flexed trunk    LOWER EXTREMITY ROM:     Active ROM Right Eval Left Eval  Hip flexion Decreased in sitting compared to L   Hip extension    Hip abduction    Hip adduction    Hip internal rotation    Hip external rotation    Knee  flexion    Knee extension Lacks full knee extension in sitting   Ankle dorsiflexion Limited in sitting Limited in sitting  Ankle plantarflexion    Ankle inversion    Ankle eversion     (Blank rows = not tested)  LOWER EXTREMITY MMT:    MMT Right Eval Left Eval  Hip flexion 3- 4+  Hip extension    Hip abduction    Hip adduction    Hip internal rotation    Hip external rotation    Knee flexion 3 4+  Knee extension 3- 4+  Ankle dorsiflexion 3- 4+  Ankle plantarflexion 3- 4+  Ankle inversion    Ankle eversion    (Blank rows = not tested)  Manual Muscle Test Scale 0/5 = No muscle contraction can be seen or felt 1/5 = Contraction can be felt, but there is no motion 2-/5 = Part moves through incomplete ROM w/ gravity decreased 2/5 = Part moves through complete ROM w/ gravity decreased 2+/5 = Part moves through incomplete ROM (<50%) against gravity or through complete ROM w/ gravity 3-/5 = Part moves through incomplete ROM (>50%) against gravity 3/5 = Part moves through complete ROM against gravity 3+/5 = Part moves through complete ROM against gravity/slight resistance 4-/5= Holds test position against slight to moderate pressure 4/5 = Part moves through complete ROM against gravity/moderate resistance 4+/5= Holds test position against moderate to strong pressure 5/5 = Part moves through complete ROM against gravity/full resistance  BED MOBILITY:  Sit to supine   Supine to sit   Need to assess  ADL: Reports dificulty getitng on R shoe, specifically having trouble pushing his foot down in his shoe  TRANSFERS: Assistive device utilized: Hurry cane  Sit to stand: Modified independence and SBA Stand to sit: Modified independence and SBA Chair to chair: Modified independence and SBA Floor: would benefit from testing in future  RAMP:  Level of Assistance:   Assistive device utilized: Hurry cane Ramp Comments: would benefit from assessing  CURB:  Level of Assistance:    Assistive device utilized: [employer] Comments: would benefit from assessing  STAIRS: Level of Assistance: CGA and Min A Stair Negotiation Technique: Step to Pattern Forwards with Bilateral Rails Number of Stairs: 4  Height  of Stairs: 6in  Comments: leading with R LE in both directions, attempts reciprocal pattern on ascent with sufficient strength, but having uncontrolled anterior lean during descent  GAIT: Gait pattern: step through pattern, decreased arm swing- Right, decreased arm swing- Left, decreased step length- Right, decreased step length- Left, decreased stride length, decreased hip/knee flexion- Right, decreased hip/knee flexion- Left, decreased ankle dorsiflexion- Right, decreased ankle dorsiflexion- Left, decreased trunk rotation, trunk flexed, poor foot clearance- Right, and poor foot clearance- Left Distance walked: 168ft Assistive device utilized: None Level of assistance: CGA Comments: significantly decreased  FUNCTIONAL TESTS:  5 times sit to stand: 38.53 seconds arms across chest Timed up and go (TUG): 19.46 seconds without AD 6 minute walk test: 10/04/2023: 580ft (163m at 0.40m/s avg) 10 meter walk test: 0.56 m/s without AD Mini-Best: 10/04/2023: 8/28  PATIENT SURVEYS:  ABC scale 17.5% with pt feeling <40% confident on all of the items, pt feels only 30% confident walking around the house, 10% confident getting in/out of car, and 20% reaching for items  OCULOMOTOR EXAM from 11/16/2023 - performed due to Neurology note reporting concerns of decreased vertical gaze:  Ocular Alignment: normal  Ocular ROM: No Limitations  Spontaneous Nystagmus: absent  Gaze-Induced Nystagmus: absent  Smooth Pursuits: intact, good vertical movements with full range  Horizontal and Vertical Saccades: extra eye movements, slow, and frequently blinks, a few extra eye movements when moving to superior target                                                                                                                                TREATMENT DATE: 12/20/23   *** *RECERT & PN at next visit* ***  Unless otherwise stated, CGA was provided and gait belt donned in order to ensure pt safety throughout session.  Therapist providing pt encouragement on his progress thus far by reviewing his progress made from initial eval to 1st progress note in LTGs below. Pt's mood improved following this.   Dynamic gait training for 5 minutes achieving ~544ft with focus on fast forwards walking to achieve reciprocal stepping pattern with increased arm swing using boxing gloves with external targets - pt does excellent identifying if he is not moving contralateral limbs together - continues to have excessive forward trunk flexion with thoracic kyphosis and forward head  Therapist stays close for SBA but no LOB occurred today Pt with a few instances of freezing when turning while trying to maintain boxing of external targets, but less severe compared to last session HR 76bpm following gait   Circuit training focusing on improved transfers and upright posture while promoting increased intensity, including: 45sec sit<>stand Could have increased to 60 seconds  20sec rest 45 sec standing rows against 2.5lbs using matrix cable machine Maybe could go up ~2lbs using AW next time 20sec rest REPEAT the above   HR elevates to 91bpm, quickly decreased to 74bpm    Gait training ~129ft to OT room,  no AD, with continued focus on carryover of improved step lengths, upright posture, and arm swing with pt demoing good carryover today.  ***     PATIENT EDUCATION: Education details: exercise technique Person educated: Patient Education method: Explanation Education comprehension: verbalized understanding and needs further education  HOME EXERCISE PROGRAM:  Access Code: [email or web address] URL: [email or web address] Date: 12/02/2023 Prepared by: Carlen Chasten  Exercises - Sidelying  Thoracic Rotation with Open Book  - 1 x daily - 7 x weekly - 2 sets - 10 reps - Supine Shoulder External Rotation in Abduction  - 1 x daily - 7 x weekly - 2 sets - 10 reps - 10 seconds hold - Seated Scapular Retraction  - 1 x daily - 7 x weekly - 3 sets - 10 reps - 3 seconds hold - Sit to Stand with Arm Swing  - 1 x daily - 7 x weekly - 2 sets - 10 reps   GOALS: Goals reviewed with patient? Yes  SHORT TERM GOALS: Target date: 11/09/2023    Patient will be independent in home exercise program to improve strength/mobility for better functional independence with ADLs. Baseline: need to initiate 10/07/2023: provided Goal status: IN PROGRESS   LONG TERM GOALS: Target date: 12/21/2023  1.  Patient (< 40 years old) will complete five times sit to stand test in < 12 seconds indicating an increased LE strength and improved balance. Baseline: 09/28/23: 38.53 seconds 11/09/2023: 22.13 seconds with arms across chest Goal status: IN PROGRESS   2. Patient will reduce timed up and go to <11 seconds to reduce fall risk and demonstrate improved transfer/gait ability. Baseline: 09/28/23: 19.46 seconds without AD 11/09/2023: 16.96 seconds, no AD, with close SBA for balance safety Goal status: IN PROGRESS  3.  Patient will increase 10 meter walk test to >1.57m/s as to improve gait speed for better community ambulation and to reduce fall risk. Baseline: 09/28/23: 0.36m/s without AD 11/09/2023: 0.89 m/s (11.50 seconds seconds 10.94 seconds) no AD, with close SBA for safety Goal status: IN PROGRESS  4.  Patient will increase six minute walk test distance to >1032ft for progression to community ambulator and improve gait ability Baseline: 10/04/2023: 548ft (176m at 0.4m/s avg) 11/09/2023: 1061 feet (323 meters, Avg speed 0.897 m/s) without AD Goal status: MET and Advanced 11/09/2023 Goal Updated: distance > 1341ft  5. Patient will increase MiniBest Test score to >18/28 to indicate a reduced risk for falling and  demonstrate increased independence with functional mobility and ADLs.  Baseline: 10/04/2023: 8/28  11/16/2023: 16/28   Goal status: IN PROGRESS  6. Patient will increase ABC scale score >80% to demonstrate better functional mobility and better confidence with ADLs.   Baseline: 09/28/23: 17.5% 11/09/2023: 21.25% - pt reports he rated the test as is if he was not using his walker because this is his long term goal   Goal status: IN PROGRESS   ASSESSMENT:  CLINICAL IMPRESSION:  *** Patient arrives to session with reports of overall decreased mood due to being upset regarding his CLOF. Therapist provides encouragement and education to pt on his progress thus far with therapy and this improves patient's outlook. Therapy session focused on promoting overall increased intensity of interventions targeting improved gait mechanics, transfers, and upright posture. Patient tolerated use of circuit training with a noticeable increase in his HR and will benefit from continuing this in future sessions. Patient remains eager to progress his functional mobility to remain independent. Mr. Lembo will benefit from continued skilled PT to improve  these deficits in order to increase QOL and ease/safety with ADLs and functional mobility as well as decrease risk for falls.   OBJECTIVE IMPAIRMENTS: Abnormal gait, decreased activity tolerance, decreased balance, decreased coordination, decreased endurance, decreased knowledge of condition, decreased knowledge of use of DME, decreased mobility, difficulty walking, decreased ROM, decreased strength, decreased safety awareness, improper body mechanics, postural dysfunction, and pain.   ACTIVITY LIMITATIONS: carrying, lifting, bending, standing, squatting, sleeping, stairs, transfers, bed mobility, continence, bathing, toileting, dressing, reach over head, hygiene/grooming, and locomotion level  PARTICIPATION LIMITATIONS: meal prep, cleaning, laundry, and community  activity  PERSONAL FACTORS: Fitness and 3+ comorbidities: Vitamin D deficiency, Hyperlipidemia, and HTN are also affecting patient's functional outcome.   REHAB POTENTIAL: Good  CLINICAL DECISION MAKING: Evolving/moderate complexity  EVALUATION COMPLEXITY: Moderate  PLAN:  PT FREQUENCY: 1-2x/week  PT DURATION: 12 weeks  PLANNED INTERVENTIONS: 97164- PT Re-evaluation, 97110-Therapeutic exercises, 97530- Therapeutic activity, 97112- Neuromuscular re-education, 97535- Self Care, 82956- Manual therapy, [phone]- Gait training, [phone]- Orthotic Fit/training, [phone]- Canalith repositioning, [phone]- Electrical stimulation (manual), Patient/Family education, Balance training, Stair training, Joint mobilization, Vestibular training, Visual/preceptual remediation/compensation, DME instructions, Cryotherapy, and Moist heat  PLAN FOR NEXT SESSION:  ***  *RECERT and Progress Note* - review updated HEP  - gait training with increased intensity - dynamic gait with focus on increased upright posture and speed of ambulation with arm swing (add AWs to UEs with boxing gloves)  - add dual-task challenges of head rotations, stepping over obstacles, sudden turns, and changes in gait speed - increased focus on turns - stepping in different directions   - with dual-task of Blaze Pods to promote increased speed/amplitude of movements  - continue arm reaching and trunk rotation (specifically to R) - standing balance: narrow BOS/SLS, on compliant surfaces, incline, eyes open&eyes closed - stepping strategy re-training: focus on posterior stepping - large amplitude UE movements with promotion of improved upright posture - B LE strengthening (R>L)  - progress to floor transfers   Carlen Chasten, PT, DPT, NCS, CSRS Physical Therapist - [facility]  [facility]  7:46 AM 12/20/23

## 2023-12-20 NOTE — Telephone Encounter (Signed)
 Pt contacted via telephone and reports he mixed up the dates and thought his appointment was tomorrow. Attempted to move patient's PT and OT appointments to a later time today; however, pt not available. Informed pt of next PT appointment date and time.   Carlen Chasten, PT, DPT, NCS, CSRS

## 2023-12-23 ENCOUNTER — Telehealth: Payer: Self-pay | Admitting: Physical Therapy

## 2023-12-23 ENCOUNTER — Ambulatory Visit: Admitting: Occupational Therapy

## 2023-12-23 ENCOUNTER — Ambulatory Visit: Admitting: Physical Therapy

## 2023-12-23 NOTE — Therapy (Incomplete)
 OUTPATIENT PHYSICAL THERAPY NEURO TREATMENT   Patient Name: Daniel Daugherty MRN: [id number] DOB:[birth year], 58 y.o., male Today's Date: 12/23/2023   PCP: Tawnya Fava, Waddell Guess, MD  REFERRING PROVIDER: Tawnya Fava, Waddell Guess, MD   END OF SESSION:  ***    Past Medical History:  Diagnosis Date   Hypertension    Past Surgical History:  Procedure Laterality Date   DG HAND LEFT COMPLETE ([facility] HX) Left 04/13/1999   There are no active problems to display for this patient.   ONSET DATE: 2022 is when he noticed the change and that was around the same time he was diagnosed with Parkinson's  REFERRING DIAG:  G20.A1 (ICD-10-CM) - Parkinson disease (HCC)  Z74.1 (ICD-10-CM) - Requires daily assistance for activities of daily living (ADL) and comfort needs   THERAPY DIAG: *** No diagnosis found.  Rationale for Evaluation and Treatment: Rehabilitation  SUBJECTIVE:                                                                                                                                                                                             SUBJECTIVE STATEMENT:  ***  Pt states "it's just one of them days...ya know where you don't want to be bothered." Pt states "I feel like I'm trying to get better, but I'm just not going anywhere."    Denies stumbles/falls since last session. Pt states he is irritated because he wants his old lifestyle back.   ***  Pt accompanied by: self  PERTINENT HISTORY: Parkinsonism, Vitamin D deficiency, Hyperlipidemia, HTN, poor medication compliance (per chart review)  PAIN:  Are you having pain? Yes: NPRS scale: 5/10, but states "it ain't that bad" Pain location: R shoulder and low back Pain description: throbbing in shoulder Aggravating factors: overhead movements or reaching back Relieving factors: rest and at home uses a "massager"  PRECAUTIONS: Fall  RED FLAGS: None   WEIGHT BEARING RESTRICTIONS: No  FALLS: Has patient fallen  in last 6 months? Yes. 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   PATIENT GOALS: Get my balance right with my walking, help  get my R arm stronger and be able to move it more, improve strength in my legs (specifically the Right)  OBJECTIVE:  Note: Objective measures were completed at Evaluation unless otherwise noted.  DIAGNOSTIC FINDINGS: N/A  COGNITION: Overall cognitive status: Within functional limits for tasks assessed; however, did not assess higher level cognitive tasks   SENSATION: WFL Light touch on screen Pt reports R foot falls asleep sitting on toilet and he has difficult time getting feeling back in it before feeling safe standing up.  COORDINATION: Overall bradykinesia with decreased speed and amplitude of movements  EDEMA:  None present  MUSCLE TONE: Not formally assessed  MUSCLE LENGTH: Not formally assessed, but may have some hamstring tightness on R associated with quad weakness and inability to achieve full knee extension in sitting  DTRs:  Not assessed  POSTURE: rounded shoulders, forward head, increased thoracic kyphosis, posterior pelvic tilt, and flexed trunk    LOWER EXTREMITY ROM:     Active ROM Right Eval Left Eval  Hip flexion Decreased in sitting compared to L   Hip extension    Hip abduction    Hip adduction    Hip internal rotation    Hip external rotation    Knee  flexion    Knee extension Lacks full knee extension in sitting   Ankle dorsiflexion Limited in sitting Limited in sitting  Ankle plantarflexion    Ankle inversion    Ankle eversion     (Blank rows = not tested)  LOWER EXTREMITY MMT:    MMT Right Eval Left Eval  Hip flexion 3- 4+  Hip extension    Hip abduction    Hip adduction    Hip internal rotation    Hip external rotation    Knee flexion 3 4+  Knee extension 3- 4+  Ankle dorsiflexion 3- 4+  Ankle plantarflexion 3- 4+  Ankle inversion    Ankle eversion    (Blank rows = not tested)  Manual Muscle Test Scale 0/5 = No muscle contraction can be seen or felt 1/5 = Contraction can be felt, but there is no motion 2-/5 = Part moves through incomplete ROM w/ gravity decreased 2/5 = Part moves through complete ROM w/ gravity decreased 2+/5 = Part moves through incomplete ROM (<50%) against gravity or through complete ROM w/ gravity 3-/5 = Part moves through incomplete ROM (>50%) against gravity 3/5 = Part moves through complete ROM against gravity 3+/5 = Part moves through complete ROM against gravity/slight resistance 4-/5= Holds test position against slight to moderate pressure 4/5 = Part moves through complete ROM against gravity/moderate resistance 4+/5= Holds test position against moderate to strong pressure 5/5 = Part moves through complete ROM against gravity/full resistance  BED MOBILITY:  Sit to supine   Supine to sit   Need to assess  ADL: Reports dificulty getitng on R shoe, specifically having trouble pushing his foot down in his shoe  TRANSFERS: Assistive device utilized: Hurry cane  Sit to stand: Modified independence and SBA Stand to sit: Modified independence and SBA Chair to chair: Modified independence and SBA Floor: would benefit from testing in future  RAMP:  Level of Assistance:   Assistive device utilized: Hurry cane Ramp Comments: would benefit from assessing  CURB:  Level of Assistance:    Assistive device utilized: [employer] Comments: would benefit from assessing  STAIRS: Level of Assistance: CGA and Min A Stair Negotiation Technique: Step to Pattern Forwards with Bilateral Rails Number of Stairs: 4  Height  of Stairs: 6in  Comments: leading with R LE in both directions, attempts reciprocal pattern on ascent with sufficient strength, but having uncontrolled anterior lean during descent  GAIT: Gait pattern: step through pattern, decreased arm swing- Right, decreased arm swing- Left, decreased step length- Right, decreased step length- Left, decreased stride length, decreased hip/knee flexion- Right, decreased hip/knee flexion- Left, decreased ankle dorsiflexion- Right, decreased ankle dorsiflexion- Left, decreased trunk rotation, trunk flexed, poor foot clearance- Right, and poor foot clearance- Left Distance walked: 183ft Assistive device utilized: None Level of assistance: CGA Comments: significantly decreased  FUNCTIONAL TESTS:  5 times sit to stand: 38.53 seconds arms across chest Timed up and go (TUG): 19.46 seconds without AD 6 minute walk test: 10/04/2023: 533ft (159m at 0.28m/s avg) 10 meter walk test: 0.56 m/s without AD Mini-Best: 10/04/2023: 8/28  PATIENT SURVEYS:  ABC scale 17.5% with pt feeling <40% confident on all of the items, pt feels only 30% confident walking around the house, 10% confident getting in/out of car, and 20% reaching for items  OCULOMOTOR EXAM from 11/16/2023 - performed due to Neurology note reporting concerns of decreased vertical gaze:  Ocular Alignment: normal  Ocular ROM: No Limitations  Spontaneous Nystagmus: absent  Gaze-Induced Nystagmus: absent  Smooth Pursuits: intact, good vertical movements with full range  Horizontal and Vertical Saccades: extra eye movements, slow, and frequently blinks, a few extra eye movements when moving to superior target                                                                                                                                TREATMENT DATE: 12/23/23   *** *RECERT & PN at next visit* *** ABC scale 6 min walk test ? 5xSTS TUG    Unless otherwise stated, CGA was provided and gait belt donned in order to ensure pt safety throughout session.  Therapist providing pt encouragement on his progress thus far by reviewing his progress made from initial eval to 1st progress note in LTGs below. Pt's mood improved following this.   Dynamic gait training for 5 minutes achieving ~519ft with focus on fast forwards walking to achieve reciprocal stepping pattern with increased arm swing using boxing gloves with external targets - pt does excellent identifying if he is not moving contralateral limbs together - continues to have excessive forward trunk flexion with thoracic kyphosis and forward head  Therapist stays close for SBA but no LOB occurred today Pt with a few instances of freezing when turning while trying to maintain boxing of external targets, but less severe compared to last session HR 76bpm following gait   Circuit training focusing on improved transfers and upright posture while promoting increased intensity, including: 45sec sit<>stand Could have increased to 60 seconds  20sec rest 45 sec standing rows against 2.5lbs using matrix cable machine Maybe could go up ~2lbs using AW next time 20sec rest REPEAT the above   HR elevates to 91bpm, quickly decreased  to 74bpm    Gait training ~161ft to OT room, no AD, with continued focus on carryover of improved step lengths, upright posture, and arm swing with pt demoing good carryover today.  ***     PATIENT EDUCATION: Education details: exercise technique Person educated: Patient Education method: Explanation Education comprehension: verbalized understanding and needs further education  HOME EXERCISE PROGRAM:  Access Code: [email or web address] URL: [email or web address] Date:  12/02/2023 Prepared by: Carlen Chasten  Exercises - Sidelying Thoracic Rotation with Open Book  - 1 x daily - 7 x weekly - 2 sets - 10 reps - Supine Shoulder External Rotation in Abduction  - 1 x daily - 7 x weekly - 2 sets - 10 reps - 10 seconds hold - Seated Scapular Retraction  - 1 x daily - 7 x weekly - 3 sets - 10 reps - 3 seconds hold - Sit to Stand with Arm Swing  - 1 x daily - 7 x weekly - 2 sets - 10 reps   GOALS: Goals reviewed with patient? Yes  SHORT TERM GOALS: Target date: 11/09/2023    Patient will be independent in home exercise program to improve strength/mobility for better functional independence with ADLs. Baseline: need to initiate 10/07/2023: provided Goal status: IN PROGRESS   LONG TERM GOALS: Target date: 12/21/2023  1.  Patient (< 9 years old) will complete five times sit to stand test in < 12 seconds indicating an increased LE strength and improved balance. Baseline: 09/28/23: 38.53 seconds 11/09/2023: 22.13 seconds with arms across chest Goal status: IN PROGRESS   2. Patient will reduce timed up and go to <11 seconds to reduce fall risk and demonstrate improved transfer/gait ability. Baseline: 09/28/23: 19.46 seconds without AD 11/09/2023: 16.96 seconds, no AD, with close SBA for balance safety Goal status: IN PROGRESS  3.  Patient will increase 10 meter walk test to >1.91m/s as to improve gait speed for better community ambulation and to reduce fall risk. Baseline: 09/28/23: 0.23m/s without AD 11/09/2023: 0.89 m/s (11.50 seconds seconds 10.94 seconds) no AD, with close SBA for safety Goal status: IN PROGRESS  4.  Patient will increase six minute walk test distance to >1037ft for progression to community ambulator and improve gait ability Baseline: 10/04/2023: 534ft (184m at 0.99m/s avg) 11/09/2023: 1061 feet (323 meters, Avg speed 0.897 m/s) without AD Goal status: MET and Advanced 11/09/2023 Goal Updated: distance > 1368ft  5. Patient will increase MiniBest Test  score to >18/28 to indicate a reduced risk for falling and demonstrate increased independence with functional mobility and ADLs.  Baseline: 10/04/2023: 8/28  11/16/2023: 16/28   Goal status: IN PROGRESS  6. Patient will increase ABC scale score >80% to demonstrate better functional mobility and better confidence with ADLs.   Baseline: 09/28/23: 17.5% 11/09/2023: 21.25% - pt reports he rated the test as is if he was not using his walker because this is his long term goal   Goal status: IN PROGRESS   ASSESSMENT:  CLINICAL IMPRESSION:  *** Patient arrives to session with reports of overall decreased mood due to being upset regarding his CLOF. Therapist provides encouragement and education to pt on his progress thus far with therapy and this improves patient's outlook. Therapy session focused on promoting overall increased intensity of interventions targeting improved gait mechanics, transfers, and upright posture. Patient tolerated use of circuit training with a noticeable increase in his HR and will benefit from continuing this in future sessions. Patient remains eager to progress his functional mobility to remain  independent. Mr. Garrels will benefit from continued skilled PT to improve these deficits in order to increase QOL and ease/safety with ADLs and functional mobility as well as decrease risk for falls.   OBJECTIVE IMPAIRMENTS: Abnormal gait, decreased activity tolerance, decreased balance, decreased coordination, decreased endurance, decreased knowledge of condition, decreased knowledge of use of DME, decreased mobility, difficulty walking, decreased ROM, decreased strength, decreased safety awareness, improper body mechanics, postural dysfunction, and pain.   ACTIVITY LIMITATIONS: carrying, lifting, bending, standing, squatting, sleeping, stairs, transfers, bed mobility, continence, bathing, toileting, dressing, reach over head, hygiene/grooming, and locomotion level  PARTICIPATION LIMITATIONS:  meal prep, cleaning, laundry, and community activity  PERSONAL FACTORS: Fitness and 3+ comorbidities: Vitamin D deficiency, Hyperlipidemia, and HTN are also affecting patient's functional outcome.   REHAB POTENTIAL: Good  CLINICAL DECISION MAKING: Evolving/moderate complexity  EVALUATION COMPLEXITY: Moderate  PLAN:  PT FREQUENCY: 1-2x/week  PT DURATION: 12 weeks  PLANNED INTERVENTIONS: 97164- PT Re-evaluation, 97110-Therapeutic exercises, 97530- Therapeutic activity, 97112- Neuromuscular re-education, 97535- Self Care, 45409- Manual therapy, [phone]- Gait training, [phone]- Orthotic Fit/training, [phone]- Canalith repositioning, [phone]- Electrical stimulation (manual), Patient/Family education, Balance training, Stair training, Joint mobilization, Vestibular training, Visual/preceptual remediation/compensation, DME instructions, Cryotherapy, and Moist heat  PLAN FOR NEXT SESSION:  ***  *RECERT & PN at next visit* - review updated HEP  - gait training with increased intensity - dynamic gait with focus on increased upright posture and speed of ambulation with arm swing (add AWs to UEs with boxing gloves)  - add dual-task challenges of head rotations, stepping over obstacles, sudden turns, and changes in gait speed - increased focus on turns - stepping in different directions   - with dual-task of Blaze Pods to promote increased speed/amplitude of movements  - continue arm reaching and trunk rotation (specifically to R) - standing balance: narrow BOS/SLS, on compliant surfaces, incline, eyes open&eyes closed - stepping strategy re-training: focus on posterior stepping - large amplitude UE movements with promotion of improved upright posture - B LE strengthening (R>L)  - progress to floor transfers   Carlen Chasten, PT, DPT, NCS, CSRS Physical Therapist - [facility]  [facility]  7:46 AM 12/23/23

## 2023-12-29 ENCOUNTER — Ambulatory Visit: Admitting: Physical Therapy

## 2023-12-29 ENCOUNTER — Telehealth: Payer: Self-pay | Admitting: Physical Therapy

## 2023-12-29 ENCOUNTER — Ambulatory Visit: Admitting: Occupational Therapy

## 2023-12-29 NOTE — Therapy (Incomplete)
 OUTPATIENT PHYSICAL THERAPY NEURO TREATMENT/RECERT ***   Patient Name: Daniel Daugherty MRN: [id number] DOB:Nov 25, 1965, 58 y.o., male Today's Date: 12/29/2023   PCP: Tawnya Fava, Waddell Guess, MD  REFERRING PROVIDER: Tawnya Fava, Waddell Guess, MD   END OF SESSION:  ***    Past Medical History:  Diagnosis Date   Hypertension    Past Surgical History:  Procedure Laterality Date   DG HAND LEFT COMPLETE ([facility] HX) Left 04/13/1999   There are no active problems to display for this patient.   ONSET DATE: 2022 is when he noticed the change and that was around the same time he was diagnosed with Parkinson's  REFERRING DIAG:  G20.A1 (ICD-10-CM) - Parkinson disease (HCC)  Z74.1 (ICD-10-CM) - Requires daily assistance for activities of daily living (ADL) and comfort needs   THERAPY DIAG: *** No diagnosis found.  Rationale for Evaluation and Treatment: Rehabilitation  SUBJECTIVE:                                                                                                                                                                                             SUBJECTIVE STATEMENT:  ***RECERT  Pt states "it's just one of them days...ya know where you don't want to be bothered." Pt states "I feel like I'm trying to get better, but I'm just not going anywhere."    Denies stumbles/falls since last session. Pt states he is irritated because he wants his old lifestyle back.   ***  Pt accompanied by: self  PERTINENT HISTORY: Parkinsonism, Vitamin D deficiency, Hyperlipidemia, HTN, poor medication compliance (per chart review)  PAIN:  Are you having pain? Yes: NPRS scale: 5/10, but states "it ain't that bad" Pain location: R shoulder and low back Pain description: throbbing in shoulder Aggravating factors: overhead movements or reaching back Relieving factors: rest and at home uses a "massager"  PRECAUTIONS: Fall  RED FLAGS: None   WEIGHT BEARING RESTRICTIONS: No  FALLS:  Has patient fallen in last 6 months? Yes. 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   PATIENT GOALS: Get my balance right with my walking,  help get my R arm stronger and be able to move it more, improve strength in my legs (specifically the Right)  OBJECTIVE:  Note: Objective measures were completed at Evaluation unless otherwise noted.  DIAGNOSTIC FINDINGS: N/A  COGNITION: Overall cognitive status: Within functional limits for tasks assessed; however, did not assess higher level cognitive tasks   SENSATION: WFL Light touch on screen Pt reports R foot falls asleep sitting on toilet and he has difficult time getting feeling back in it before feeling safe standing up.  COORDINATION: Overall bradykinesia with decreased speed and amplitude of movements  EDEMA:  None present  MUSCLE TONE: Not formally assessed  MUSCLE LENGTH: Not formally assessed, but may have some hamstring tightness on R associated with quad weakness and inability to achieve full knee extension in sitting  DTRs:  Not assessed  POSTURE: rounded shoulders, forward head, increased thoracic kyphosis, posterior pelvic tilt, and flexed trunk    LOWER EXTREMITY ROM:     Active ROM Right Eval Left Eval  Hip flexion Decreased in sitting compared to L   Hip extension    Hip abduction    Hip adduction    Hip internal rotation    Hip  external rotation    Knee flexion    Knee extension Lacks full knee extension in sitting   Ankle dorsiflexion Limited in sitting Limited in sitting  Ankle plantarflexion    Ankle inversion    Ankle eversion     (Blank rows = not tested)  LOWER EXTREMITY MMT:    MMT Right Eval Left Eval  Hip flexion 3- 4+  Hip extension    Hip abduction    Hip adduction    Hip internal rotation    Hip external rotation    Knee flexion 3 4+  Knee extension 3- 4+  Ankle dorsiflexion 3- 4+  Ankle plantarflexion 3- 4+  Ankle inversion    Ankle eversion    (Blank rows = not tested)  Manual Muscle Test Scale 0/5 = No muscle contraction can be seen or felt 1/5 = Contraction can be felt, but there is no motion 2-/5 = Part moves through incomplete ROM w/ gravity decreased 2/5 = Part moves through complete ROM w/ gravity decreased 2+/5 = Part moves through incomplete ROM (<50%) against gravity or through complete ROM w/ gravity 3-/5 = Part moves through incomplete ROM (>50%) against gravity 3/5 = Part moves through complete ROM against gravity 3+/5 = Part moves through complete ROM against gravity/slight resistance 4-/5= Holds test position against slight to moderate pressure 4/5 = Part moves through complete ROM against gravity/moderate resistance 4+/5= Holds test position against moderate to strong pressure 5/5 = Part moves through complete ROM against gravity/full resistance  BED MOBILITY:  Sit to supine   Supine to sit   Need to assess  ADL: Reports dificulty getitng on R shoe, specifically having trouble pushing his foot down in his shoe  TRANSFERS: Assistive device utilized: Hurry cane  Sit to stand: Modified independence and SBA Stand to sit: Modified independence and SBA Chair to chair: Modified independence and SBA Floor: would benefit from testing in future  RAMP:  Level of Assistance:   Assistive device utilized: Hurry cane Ramp Comments: would benefit from  assessing  CURB:  Level of Assistance:   Assistive device utilized: [employer] Comments: would benefit from assessing  STAIRS: Level of Assistance: CGA and Min A Stair Negotiation Technique: Step to Pattern Forwards with Bilateral Rails Number of Stairs: 4  Height of Stairs: 6in  Comments: leading with R LE in both directions, attempts reciprocal pattern on ascent with sufficient strength, but having uncontrolled anterior lean during descent  GAIT: Gait pattern: step through pattern, decreased arm swing- Right, decreased arm swing- Left, decreased step length- Right, decreased step length- Left, decreased stride length, decreased hip/knee flexion- Right, decreased hip/knee flexion- Left, decreased ankle dorsiflexion- Right, decreased ankle dorsiflexion- Left, decreased trunk rotation, trunk flexed, poor foot clearance- Right, and poor foot clearance- Left Distance walked: 186ft Assistive device utilized: None Level of assistance: CGA Comments: significantly decreased  FUNCTIONAL TESTS:  5 times sit to stand: 38.53 seconds arms across chest Timed up and go (TUG): 19.46 seconds without AD 6 minute walk test: 10/04/2023: 571ft (117m at 0.24m/s avg) 10 meter walk test: 0.56 m/s without AD Mini-Best: 10/04/2023: 8/28  PATIENT SURVEYS:  ABC scale 17.5% with pt feeling <40% confident on all of the items, pt feels only 30% confident walking around the house, 10% confident getting in/out of car, and 20% reaching for items  OCULOMOTOR EXAM from 11/16/2023 - performed due to Neurology note reporting concerns of decreased vertical gaze:  Ocular Alignment: normal  Ocular ROM: No Limitations  Spontaneous Nystagmus: absent  Gaze-Induced Nystagmus: absent  Smooth Pursuits: intact, good vertical movements with full range  Horizontal and Vertical Saccades: extra eye movements, slow, and frequently blinks, a few extra eye movements when moving to superior target                                                                                                                                TREATMENT DATE: 12/29/23   *** *RECERT & PN at next visit* *** ABC scale 6 min walk test ? 5xSTS TUG    Unless otherwise stated, CGA was provided and gait belt donned in order to ensure pt safety throughout session.  Therapist providing pt encouragement on his progress thus far by reviewing his progress made from initial eval to 1st progress note in LTGs below. Pt's mood improved following this.   Dynamic gait training for 5 minutes achieving ~583ft with focus on fast forwards walking to achieve reciprocal stepping pattern with increased arm swing using boxing gloves with external targets - pt does excellent identifying if he is not moving contralateral limbs together - continues to have excessive forward trunk flexion with thoracic kyphosis and forward head  Therapist stays close for SBA but no LOB occurred today Pt with a few instances of freezing when turning while trying to maintain boxing of external targets, but less severe compared to last session HR 76bpm following gait   Circuit training focusing on improved transfers and upright posture while promoting increased intensity, including: 45sec sit<>stand Could have increased to 60 seconds  20sec rest 45 sec standing rows against 2.5lbs using matrix cable machine Maybe could go up ~2lbs using AW next time 20sec rest REPEAT the above   HR elevates to 91bpm, quickly  decreased to 74bpm    Gait training ~115ft to OT room, no AD, with continued focus on carryover of improved step lengths, upright posture, and arm swing with pt demoing good carryover today.  ***     PATIENT EDUCATION: Education details: exercise technique Person educated: Patient Education method: Explanation Education comprehension: verbalized understanding and needs further education  HOME EXERCISE PROGRAM:  Access Code: [email or web address] URL:  [email or web address] Date: 12/02/2023 Prepared by: Carlen Chasten  Exercises - Sidelying Thoracic Rotation with Open Book  - 1 x daily - 7 x weekly - 2 sets - 10 reps - Supine Shoulder External Rotation in Abduction  - 1 x daily - 7 x weekly - 2 sets - 10 reps - 10 seconds hold - Seated Scapular Retraction  - 1 x daily - 7 x weekly - 3 sets - 10 reps - 3 seconds hold - Sit to Stand with Arm Swing  - 1 x daily - 7 x weekly - 2 sets - 10 reps   GOALS: Goals reviewed with patient? Yes  SHORT TERM GOALS: Target date: 11/09/2023    Patient will be independent in home exercise program to improve strength/mobility for better functional independence with ADLs. Baseline: need to initiate 10/07/2023: provided Goal status: IN PROGRESS   LONG TERM GOALS: Target date: 12/21/2023  1.  Patient (< 25 years old) will complete five times sit to stand test in < 12 seconds indicating an increased LE strength and improved balance. Baseline: 09/28/23: 38.53 seconds 11/09/2023: 22.13 seconds with arms across chest Goal status: IN PROGRESS   2. Patient will reduce timed up and go to <11 seconds to reduce fall risk and demonstrate improved transfer/gait ability. Baseline: 09/28/23: 19.46 seconds without AD 11/09/2023: 16.96 seconds, no AD, with close SBA for balance safety Goal status: IN PROGRESS  3.  Patient will increase 10 meter walk test to >1.4m/s as to improve gait speed for better community ambulation and to reduce fall risk. Baseline: 09/28/23: 0.82m/s without AD 11/09/2023: 0.89 m/s (11.50 seconds seconds 10.94 seconds) no AD, with close SBA for safety Goal status: IN PROGRESS  4.  Patient will increase six minute walk test distance to >1065ft for progression to community ambulator and improve gait ability Baseline: 10/04/2023: 569ft (121m at 0.56m/s avg) 11/09/2023: 1061 feet (323 meters, Avg speed 0.897 m/s) without AD Goal status: MET and Advanced 11/09/2023 Goal Updated: distance >  1333ft  5. Patient will increase MiniBest Test score to >18/28 to indicate a reduced risk for falling and demonstrate increased independence with functional mobility and ADLs.  Baseline: 10/04/2023: 8/28  11/16/2023: 16/28   Goal status: IN PROGRESS  6. Patient will increase ABC scale score >80% to demonstrate better functional mobility and better confidence with ADLs.   Baseline: 09/28/23: 17.5% 11/09/2023: 21.25% - pt reports he rated the test as is if he was not using his walker because this is his long term goal   Goal status: IN PROGRESS   ASSESSMENT:  CLINICAL IMPRESSION:  *** Patient arrives to session with reports of overall decreased mood due to being upset regarding his CLOF. Therapist provides encouragement and education to pt on his progress thus far with therapy and this improves patient's outlook. Therapy session focused on promoting overall increased intensity of interventions targeting improved gait mechanics, transfers, and upright posture. Patient tolerated use of circuit training with a noticeable increase in his HR and will benefit from continuing this in future sessions. Patient remains eager to progress his functional mobility to  remain independent. Mr. Proehl will benefit from continued skilled PT to improve these deficits in order to increase QOL and ease/safety with ADLs and functional mobility as well as decrease risk for falls.   OBJECTIVE IMPAIRMENTS: Abnormal gait, decreased activity tolerance, decreased balance, decreased coordination, decreased endurance, decreased knowledge of condition, decreased knowledge of use of DME, decreased mobility, difficulty walking, decreased ROM, decreased strength, decreased safety awareness, improper body mechanics, postural dysfunction, and pain.   ACTIVITY LIMITATIONS: carrying, lifting, bending, standing, squatting, sleeping, stairs, transfers, bed mobility, continence, bathing, toileting, dressing, reach over head, hygiene/grooming, and  locomotion level  PARTICIPATION LIMITATIONS: meal prep, cleaning, laundry, and community activity  PERSONAL FACTORS: Fitness and 3+ comorbidities: Vitamin D deficiency, Hyperlipidemia, and HTN are also affecting patient's functional outcome.   REHAB POTENTIAL: Good  CLINICAL DECISION MAKING: Evolving/moderate complexity  EVALUATION COMPLEXITY: Moderate  PLAN:  PT FREQUENCY: 1-2x/week  PT DURATION: 12 weeks  PLANNED INTERVENTIONS: 97164- PT Re-evaluation, 97110-Therapeutic exercises, 97530- Therapeutic activity, 97112- Neuromuscular re-education, 97535- Self Care, 16109- Manual therapy, [phone]- Gait training, [phone]- Orthotic Fit/training, [phone]- Canalith repositioning, [phone]- Electrical stimulation (manual), Patient/Family education, Balance training, Stair training, Joint mobilization, Vestibular training, Visual/preceptual remediation/compensation, DME instructions, Cryotherapy, and Moist heat  PLAN FOR NEXT SESSION:  ***  *RECERT & PN at next visit* - review updated HEP  - gait training with increased intensity - dynamic gait with focus on increased upright posture and speed of ambulation with arm swing (add AWs to UEs with boxing gloves)  - add dual-task challenges of head rotations, stepping over obstacles, sudden turns, and changes in gait speed - increased focus on turns - stepping in different directions   - with dual-task of Blaze Pods to promote increased speed/amplitude of movements  - continue arm reaching and trunk rotation (specifically to R) - standing balance: narrow BOS/SLS, on compliant surfaces, incline, eyes open&eyes closed - stepping strategy re-training: focus on posterior stepping - large amplitude UE movements with promotion of improved upright posture - B LE strengthening (R>L)  - progress to floor transfers   Carlen Chasten, PT, DPT, NCS, CSRS Physical Therapist - [facility]  [facility]  7:43 AM 12/29/23

## 2023-12-29 NOTE — Telephone Encounter (Signed)
 Pt contacted via telephone to inform him of missed appointment. Patient reports he has an MD appointment this morning and forgot to call and cancel his therapy. Patient reports it is difficult for him to attend therapy appointments before 9:30AM due to him requiring significantly increased time to complete morning ADLs. Therapist able to reschedule patient's next appointment to a later time and pt in agreement with next PT appointment date and time.   Carlen Chasten, PT, DPT, NCS, CSRS

## 2024-01-04 ENCOUNTER — Ambulatory Visit: Attending: Family Medicine | Admitting: Occupational Therapy

## 2024-01-04 ENCOUNTER — Ambulatory Visit: Admitting: Physical Therapy

## 2024-01-04 DIAGNOSIS — R531 Weakness: Secondary | ICD-10-CM

## 2024-01-04 DIAGNOSIS — R2681 Unsteadiness on feet: Secondary | ICD-10-CM | POA: Insufficient documentation

## 2024-01-04 DIAGNOSIS — M6281 Muscle weakness (generalized): Secondary | ICD-10-CM | POA: Diagnosis present

## 2024-01-04 DIAGNOSIS — R278 Other lack of coordination: Secondary | ICD-10-CM | POA: Insufficient documentation

## 2024-01-04 DIAGNOSIS — R262 Difficulty in walking, not elsewhere classified: Secondary | ICD-10-CM | POA: Diagnosis present

## 2024-01-04 DIAGNOSIS — R269 Unspecified abnormalities of gait and mobility: Secondary | ICD-10-CM | POA: Insufficient documentation

## 2024-01-04 NOTE — Therapy (Signed)
 OUTPATIENT PHYSICAL THERAPY NEURO TREATMENT/RECERT    Patient Name: Daniel Daugherty MRN: [id number] DOB:28-Jul-1966, 58 y.o., male Today's Date: 01/04/2024   PCP: Tawnya Fava, Waddell Guess, MD  REFERRING PROVIDER: Tawnya Fava, Waddell Guess, MD   END OF SESSION:    PT End of Session - 01/04/24 1102     Visit Number 19    Number of Visits 24    Date for PT Re-Evaluation 03/28/24    Authorization Type Medicare 2025    PT Start Time 1105    PT Stop Time 1148    PT Time Calculation (min) 43 min    Equipment Utilized During Treatment Gait belt    Activity Tolerance Patient tolerated treatment well    Behavior During Therapy WFL for tasks assessed/performed              Past Medical History:  Diagnosis Date   Hypertension    Past Surgical History:  Procedure Laterality Date   DG HAND LEFT COMPLETE ([facility] HX) Left 04/13/1999   There are no active problems to display for this patient.   ONSET DATE: 2022 is when he noticed the change and that was around the same time he was diagnosed with Parkinson's  REFERRING DIAG:  G20.A1 (ICD-10-CM) - Parkinson disease (HCC)  Z74.1 (ICD-10-CM) - Requires daily assistance for activities of daily living (ADL) and comfort needs   THERAPY DIAG:  Unsteadiness on feet  Muscle weakness (generalized)  Other lack of coordination  Difficulty in walking, not elsewhere classified  Abnormality of gait  Generalized weakness  Rationale for Evaluation and Treatment: Rehabilitation  SUBJECTIVE:                                                                                                                                                                                             SUBJECTIVE STATEMENT:  Pt reports he has been doing "alright."  States his son turned 34y.o. this past Sunday and they had a cookout to celebrate. Pt reports he has gotten lazy since last session, not performing his HEP, and has been "kicking back." However, later states  he has been encouraging himself to stay active and walk every day using his walker as needed  Pt reports he still hasn't called Neurology office to set-up an appointment. Pt reports he stopped coming to therapy because he felt like he wasn't getting any better and it was discouraging. Reports he doesn't feel safe trying to do the things at home that he does at therapy. Therapist educated pt that he is only to do the prescribed HEP at home and that the focus of therapy sessions  is to challenge him to perform tasks that he is not safe to perform without a skilled physical therapist in order to advance his mobility.  Pt states he is ready to get better and he is frustrated with his CLOF. Pt reports he has looked up some things on social media for support and to find ideas on how to move more safely and independently. Pt sates the idea of the [employer] group is intimidating to him and he is concerned he will get frustrated by his inability to do as well as other people in the group.   Pt sates he is trying to get dressed faster, but when he times himself he hasn't increased his speed. Pt states when he gets flustered and is trying to move fast, then his tremor increase and he has to stop and relax to get them to calm back down.  Pt states he knows he needs to keep coming to therapy, but he isn't improving as fast as he wants to. Pt states he doesn't want to keep missing out on life events because of his impairments. But he can't tell how the improvements he is making in therapy are improving his every day life. Despite this, pt in agreement to complete re-certification at this time to allow him to continue with therapy because he wants to get better. Therapist educated pt on goal to focus more on specific functional mobility tasks he is having difficulty with at home.  Denies stumbles/falls since last session.   Pt accompanied by: self  PERTINENT HISTORY: Parkinsonism, Vitamin D deficiency,  Hyperlipidemia, HTN, poor medication compliance (per chart review)  PAIN:  Are you having pain? Yes: NPRS scale: 5/10, but states "it ain't that bad" Pain location: R shoulder and low back Pain description: throbbing in shoulder Aggravating factors: overhead movements or reaching back Relieving factors: rest and at home uses a "massager"  PRECAUTIONS: Fall  RED FLAGS: None   WEIGHT BEARING RESTRICTIONS: No  FALLS: Has patient fallen in last 6 months? Yes. 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   PATIENT GOALS: Get my balance right with my walking, help get my R arm stronger and be able to move it more, improve strength in my legs (specifically the Right)  OBJECTIVE:  Note: Objective measures were completed at Evaluation unless otherwise noted.  DIAGNOSTIC FINDINGS: N/A  COGNITION: Overall cognitive status: Within functional limits for tasks assessed; however, did not assess higher level cognitive tasks   SENSATION: WFL Light touch on screen Pt reports R foot falls asleep sitting on toilet and he has difficult time getting feeling back in it before feeling safe standing up.  COORDINATION: Overall bradykinesia with decreased speed and amplitude of movements  EDEMA:  None present  MUSCLE TONE: Not formally assessed  MUSCLE LENGTH: Not formally assessed, but may have some hamstring tightness on R  associated with quad weakness and inability to achieve full knee extension in sitting  DTRs:  Not assessed  POSTURE: rounded shoulders, forward head, increased thoracic kyphosis, posterior pelvic tilt, and flexed trunk    LOWER EXTREMITY ROM:     Active ROM Right Eval Left Eval  Hip flexion Decreased in sitting compared to L   Hip extension    Hip abduction    Hip adduction    Hip internal rotation    Hip external rotation    Knee flexion    Knee extension Lacks full knee extension in sitting   Ankle dorsiflexion Limited in sitting Limited in sitting  Ankle plantarflexion    Ankle inversion    Ankle eversion     (Blank rows = not tested)  LOWER EXTREMITY MMT:    MMT Right Eval Left Eval  Hip flexion 3- 4+  Hip extension    Hip abduction    Hip adduction    Hip internal rotation    Hip external rotation    Knee flexion 3 4+  Knee extension 3- 4+  Ankle dorsiflexion 3- 4+  Ankle plantarflexion 3- 4+  Ankle inversion    Ankle eversion    (Blank rows = not tested)  Manual Muscle Test Scale 0/5 = No muscle contraction can be seen or felt 1/5 = Contraction can be felt, but there is no motion 2-/5 = Part moves through incomplete ROM w/ gravity decreased 2/5 = Part moves through complete ROM w/ gravity decreased 2+/5 = Part moves through incomplete ROM (<50%) against gravity or through complete ROM w/ gravity 3-/5 = Part moves through incomplete ROM (>50%) against gravity 3/5 = Part moves through complete ROM against gravity 3+/5 = Part moves through complete ROM against gravity/slight resistance 4-/5= Holds test position against slight to moderate pressure 4/5 = Part moves through complete ROM against gravity/moderate resistance 4+/5= Holds test position against moderate to strong pressure 5/5 = Part moves through complete ROM against gravity/full resistance  BED MOBILITY:  Sit to supine   Supine to sit   Need to assess  ADL: Reports dificulty getitng on R  shoe, specifically having trouble pushing his foot down in his shoe  TRANSFERS: Assistive device utilized: Hurry cane  Sit to stand: Modified independence and SBA Stand to sit: Modified independence and SBA Chair to chair: Modified independence and SBA Floor: would benefit from testing in future  RAMP:  Level of Assistance:   Assistive device utilized: Hurry cane Ramp Comments: would benefit from assessing  CURB:  Level of Assistance:   Assistive device utilized: [employer] Comments: would benefit from assessing  STAIRS: Level of Assistance: CGA and Min A Stair Negotiation Technique: Step to Pattern Forwards with Bilateral Rails Number of Stairs: 4  Height of Stairs: 6in  Comments: leading with R LE in both directions, attempts reciprocal pattern on ascent with sufficient strength, but having uncontrolled anterior lean during descent  GAIT: Gait pattern: step through pattern, decreased arm swing- Right, decreased arm swing- Left, decreased step length- Right, decreased step length- Left, decreased stride length, decreased hip/knee flexion- Right, decreased hip/knee flexion- Left, decreased ankle dorsiflexion- Right, decreased ankle dorsiflexion- Left, decreased trunk rotation, trunk flexed, poor foot clearance- Right, and poor foot clearance- Left Distance walked: 159ft Assistive device utilized: None Level of assistance: CGA Comments: significantly decreased  FUNCTIONAL TESTS:  5 times sit to stand: 38.53 seconds arms across chest Timed up and go (TUG): 19.46 seconds without AD 6 minute walk test: 10/04/2023: 556ft (137m at 0.59m/s avg) 10 meter walk test: 0.56 m/s without AD Mini-Best: 10/04/2023: 8/28  PATIENT SURVEYS:  ABC scale 17.5% with pt feeling <40% confident on all of the items, pt feels only 30% confident walking around the house, 10% confident getting in/out of car, and 20% reaching for items  OCULOMOTOR EXAM from 11/16/2023 - performed due to Neurology note  reporting concerns of decreased vertical gaze:  Ocular Alignment: normal  Ocular ROM: No Limitations  Spontaneous Nystagmus: absent  Gaze-Induced Nystagmus: absent  Smooth Pursuits: intact, good vertical movements with full range  Horizontal and Vertical Saccades: extra eye movements, slow, and frequently blinks, a few extra eye movements when moving to superior target                                                                                                                               TREATMENT DATE: 01/04/24    Therapist provides extensive education during session on importance of continuing to stay active at home while maintaining his safety. Encouraged pt to continue walking every day as well as doing sit<>stands at home and HEP stretching exercises as he feels he can. Therapist provided encouragement and education on his progress made in therapy thus far and plan to continue addressing the functional mobility tasks he finds to be challenging in order to help him be more independent.  Therapist provides encouragement for patient to follow-up with movement specialist neurology MD to have medical support for his diagnosis and pt in agreement with therapist calling the neurology office to have them reach out and set-up an appointment.    Unless otherwise stated, CGA/light min A was provided and gait belt donned in order to ensure pt safety throughout session.  Therapy session focused on re-assessment of standardized outcome measures and subjective questionnaire to determine pt's progress with therapy thus far for re-certification.  Participated in Timed Up and Go (TUG): 1st trial: 16.32 seconds 2nd trial: 15.37 seconds  Average: 15.845 seconds no AD with close SBA for safety Patient demonstrates high fall risk as indicated by requiring >13.5seconds to complete the TUG.    Five times Sit to Stand Test (FTSS) "Stand up and sit down as quickly as possible 5 times, keeping your arms  folded across your chest."    TIME: 20.12 seconds from green chair, no UE support  Times > 13.6 seconds is associated with increased disability and morbidity (Guralnik, 2000) Times > 15 seconds is predictive of recurrent falls in healthy individuals aged 28 and older (Buatois, et al., 2008) Normal performance values in community dwelling individuals aged 44 and older (Bohannon, 2006): 60-69 years: 11.4 seconds 70-79 years: 12.6 seconds 80-89 years: 14.8 seconds  MCID: >= 2.3 seconds for Vestibular Disorders (Meretta, 2006)   10 Meter Walk Test: Patient instructed to walk 10 meters (32.8 ft) as quickly and as safely as possible x2. Time measured from 2 meter mark to 8 meter mark to accommodate ramp-up and ramp-down.  Fast speed 1: 0.89 m/s (11.17 seconds) Fast speed 2: 0.98 m/s (10.17 seconds) Average Fast speed:  0.935 m/s without AD, CGA/close SBA for safety Cut off scores: <0.4 m/s = household Ambulator, 0.4-0.8 m/s = limited community Ambulator, >0.8 m/s = community Ambulator, >1.2 m/s = crossing a street, <1.0 = increased fall risk MCID 0.05 m/s (small), 0.13 m/s (moderate), 0.06 m/s (significant)  (ANPTA Core Set of Outcome Measures for Adults with Neurologic Conditions, 2018)   Activities-specific Balance Confidence Scale:  Score: 35.625% with pt continuing to rate these activities without his walker because that is his long term goal - pt rates most items between 30-40% with walking around house as 60% Increased risk of falls in community-dwelling, older adults <80% (79.89%)  0% = no confidence - 100% = complete confidence (ANPTA Core Set of Outcome Measures for Adults with Neurologic Conditions, 2018)  Educated pt on results of assessments performed today and his continued progress due to him being intentional to stay active at home since last visit.    PATIENT EDUCATION: Education details: exercise technique Person educated: Patient Education method: Explanation Education  comprehension: verbalized understanding and needs further education  HOME EXERCISE PROGRAM:  Access Code: [email or web address] URL: [email or web address] Date: 12/02/2023 Prepared by: Carlen Chasten  Exercises - Sidelying Thoracic Rotation with Open Book  - 1 x daily - 7 x weekly - 2 sets - 10 reps - Supine Shoulder External Rotation in Abduction  - 1 x daily - 7 x weekly - 2 sets - 10 reps - 10 seconds hold - Seated Scapular Retraction  - 1 x daily - 7 x weekly - 3 sets - 10 reps - 3 seconds hold - Sit to Stand with Arm Swing  - 1 x daily - 7 x weekly - 2 sets - 10 reps   GOALS: Goals reviewed with patient? Yes  SHORT TERM GOALS: Target date: 02/15/2024    Patient will be independent in home exercise program to improve strength/mobility for better functional independence with ADLs. Baseline: need to initiate 10/07/2023: provided 12/02/2023: updated Goal status: IN PROGRESS   LONG TERM GOALS: Target date: 03/28/2024   1.  Patient (< 60 years old) will complete five times sit to stand test in < 12 seconds indicating an increased LE strength and improved balance. Baseline: 09/28/23: 38.53 seconds 11/09/2023: 22.13 seconds with arms across chest 01/04/2024: 20.12 seconds without UE support Goal status: IN PROGRESS   2. Patient will reduce timed up and go to <11 seconds to reduce fall risk and demonstrate improved transfer/gait ability. Baseline: 09/28/23: 19.46 seconds without AD 11/09/2023: 16.96 seconds, no AD, with close SBA for balance safety 01/04/2024: 15.845 seconds, no AD with close SBA for safety Goal status: IN PROGRESS  3.  Patient will increase 10 meter walk test to >1.73m/s as to improve gait speed for better community ambulation and to reduce fall risk. Baseline: 09/28/23: 0.97m/s without AD 11/09/2023: 0.89 m/s (11.50 seconds seconds 10.94 seconds) no AD, with close SBA for safety 01/04/2024: 0.935 m/s without AD, CGA/close SBA for safety Goal status: IN PROGRESS  4.  Patient  will increase six minute walk test distance to >1057ft for progression to community ambulator and improve gait ability Baseline: 10/04/2023: 544ft (134m at 0.74m/s avg) 11/09/2023: 1061 feet (323 meters, Avg speed 0.897 m/s) without AD Goal status: MET and Advanced 11/09/2023 Goal Updated: distance > 1327ft  5. Patient will increase MiniBest Test score to >18/28 to indicate a reduced risk for falling and demonstrate increased independence with functional mobility and ADLs.  Baseline: 10/04/2023: 8/28  11/16/2023: 16/28   Goal status: IN  PROGRESS  6. Patient will increase ABC scale score >80% to demonstrate better functional mobility and better confidence with ADLs.   Baseline: 09/28/23: 17.5% 11/09/2023: 21.25% - pt reports he rated the test as is if he was not using his walker because this is his long term goal  01/04/2024: 35.625% - continuing to rate it as if he is not using his walker  Goal status: IN PROGRESS   ASSESSMENT:  CLINICAL IMPRESSION:   Patient arrives to session with reports of overall decreased mood and feeling frustrated with his CLOF and not seeing improvements as quickly as he would like. Therapist provides emotional support and encouragement on his progress towards LTGs thus far during therapy. Also, performed re-assessment of standardized outcome measures and subjective questionnaire to determine pt's continued progress for re-certification today. Patient continues to demo improvements on 5xSTS, TUG, and . However, patient reports he cannot see these benefits translating into his every day life; therefore, will benefit from more specific interventions targeting functional mobility tasks that pt reports as challenging. Patient remains eager to progress his functional mobility to remain independent. Mr. Matton will benefit from continued skilled PT to improve these deficits in order to increase QOL and ease/safety with ADLs and functional mobility as well as decrease risk for falls.    OBJECTIVE IMPAIRMENTS: Abnormal gait, decreased activity tolerance, decreased balance, decreased coordination, decreased endurance, decreased knowledge of condition, decreased knowledge of use of DME, decreased mobility, difficulty walking, decreased ROM, decreased strength, decreased safety awareness, improper body mechanics, postural dysfunction, and pain.   ACTIVITY LIMITATIONS: carrying, lifting, bending, standing, squatting, sleeping, stairs, transfers, bed mobility, continence, bathing, toileting, dressing, reach over head, hygiene/grooming, and locomotion level  PARTICIPATION LIMITATIONS: meal prep, cleaning, laundry, and community activity  PERSONAL FACTORS: Fitness and 3+ comorbidities: Vitamin D deficiency, Hyperlipidemia, and HTN are also affecting patient's functional outcome.   REHAB POTENTIAL: Good  CLINICAL DECISION MAKING: Evolving/moderate complexity  EVALUATION COMPLEXITY: Moderate  PLAN:  PT FREQUENCY: 1-2x/week  PT DURATION: 12 weeks  PLANNED INTERVENTIONS: 97164- PT Re-evaluation, 97110-Therapeutic exercises, 97530- Therapeutic activity, 97112- Neuromuscular re-education, 97535- Self Care, 62130- Manual therapy, [phone]- Gait training, [phone]- Orthotic Fit/training, [phone]- Canalith repositioning, [phone]- Electrical stimulation (manual), Patient/Family education, Balance training, Stair training, Joint mobilization, Vestibular training, Visual/preceptual remediation/compensation, DME instructions, Cryotherapy, and Moist heat  PLAN FOR NEXT SESSION:  *PN at next visit* - complete 6 min walk test and MiniBest **focus on functional mobility tasks patient reports as challenging** - review updated HEP  - gait training with increased intensity - dynamic gait with focus on increased upright posture and speed of ambulation with arm swing (add AWs to UEs with boxing gloves)  - add dual-task challenges of head rotations, stepping over obstacles, sudden turns, and changes in gait  speed - increased focus on turns - stepping in different directions   - with dual-task of Blaze Pods to promote increased speed/amplitude of movements  - continue arm reaching and trunk rotation (specifically to R) - standing balance: narrow BOS/SLS, on compliant surfaces, incline, eyes open&eyes closed - stepping strategy re-training: focus on posterior stepping - large amplitude UE movements with promotion of improved upright posture - B LE strengthening (R>L)  - progress to floor transfers   Carlen Chasten, PT, DPT, NCS, CSRS Physical Therapist - [facility]  12:13 PM 01/04/24

## 2024-01-04 NOTE — Therapy (Addendum)
 Occupational Therapy Progress/Recertification Note  Dates of reporting period  09/28/23   to   01/04/24   Patient Name: Daniel Daugherty MRN: [id number] DOB:January 21, 1966, 58 y.o., male Today's Date: 01/04/2024  PCP: N/A REFERRING PROVIDER: Arlen Belton, MD  END OF SESSION:  OT End of Session - 01/04/24 1152     Visit Number 20    Number of Visits 48    Date for OT Re-Evaluation 03/28/24    OT Start Time 1145    OT Stop Time 1230    OT Time Calculation (min) 45 min    Activity Tolerance Patient tolerated treatment well    Behavior During Therapy WFL for tasks assessed/performed                 Past Medical History:  Diagnosis Date   Hypertension    Past Surgical History:  Procedure Laterality Date   DG HAND LEFT COMPLETE ([facility] HX) Left 04/13/1999   There are no active problems to display for this patient.   ONSET DATE: 05/2021  REFERRING DIAG: Parkinson's Disease  THERAPY DIAG:  Muscle weakness (generalized)  Other lack of coordination  Rationale for Evaluation and Treatment: Rehabilitation  SUBJECTIVE:   SUBJECTIVE STATEMENT:  Pt. reports having had a nice weekend. Pt accompanied by: self  PERTINENT HISTORY:   Pt. is a 58 y.o. male who was diagnosed with Parkinson's Disease in 2022. Pt. reports having had a progressive decline in function since having hernia surgery, and right shoulder rotator cuff surgery. PMHx included: HTN. Of note; Pt. was being followed by [facility], and receiving Home health therapy services. Pt. changed physicians, and was referred for outpatient rehab services here.  PRECAUTIONS: None  WEIGHT BEARING RESTRICTIONS: No  PAIN:  Are you having pain? 0/10  FALLS: Has patient fallen in last 6 months? No, 1 small slip near the bed tripped over shoes  LIVING ENVIRONMENT: Lives with: lives alone Lives in: [place],  Stairs: Entry level Has following equipment at home: Quad cane small base, rollator, shower chair, grab bars, hand  rail at [employer]  PLOF: Independent  PATIENT GOALS: Improve self-care  OBJECTIVE:  Note: Objective measures were completed at Evaluation unless otherwise noted.  HAND DOMINANCE: Right  ADLs:  Assist at home:  Ex wife, and children assist several times a week  Transfers/ambulation related to ADLs: Modified Independent Eating: Difficulty using dominant right hand. Drops food when scooping, Both hands for stabilizing drinks, and uses a straw. Unable to cut food.  Grooming: Increased time required for all grooming tasks. UB Dressing: Difficulty managing buttons LB Dressing: Less assist with pants with elastic, more assist with dress pants Toileting: Increased time for hygiene. Bathing: Requires increased time to complete Tub Shower transfers: Modified Independence with increased time  IADLs: Shopping: Son assists with grocery shopping Light housekeeping: Son's assist. Pt. Does light rising off of dishes. Meal Prep: Increased time. Independent with microwave, and air fryer. Difficulty opening bottles, containers Community mobility:  Transportation, light driving to grocery store Medication management:  Son's set-up weekly pillbox, Pt. Is responsible for taking the medications Financial management: Son's assist with monthly finances Handwriting: 50% legible Hobbies: Dancing, watching sports Career/work: Restaurant manager, fast food  MOBILITY STATUS: Uses a cane; shuffling gait  FUNCTIONAL OUTCOME MEASURES:  Mam-20 sum score: 53/80, MAM Measure score: 52.4   UPPER EXTREMITY ROM:    Active ROM Right eval Right 11/09/23 01/04/24 Left Eval [facility] overall  Shoulder flexion WFL [phone]) [phone])   Shoulder abduction 52(100) 60(110) 91(478)  Shoulder adduction      Shoulder extension      Shoulder internal rotation      Shoulder external rotation      Elbow flexion [facility] WFL   Elbow extension [facility] WFL   Wrist flexion      Wrist extension [facility] WFL   Wrist ulnar deviation       Wrist radial deviation      Wrist pronation      Wrist supination      (Blank rows = not tested)  UPPER EXTREMITY MMT:     MMT Right eval Right 11/09/23 Right 01/04/24 Left Eval 4-/5 overall Left 11/09/23 4/5 Overall  Shoulder flexion 3/5 3-/5 3-/5 3-/5   Shoulder abduction 2/5 2/5 2+/5 2+/5   Shoulder adduction       Shoulder extension       Shoulder internal rotation       Shoulder external rotation       Middle trapezius       Lower trapezius       Elbow flexion 3/5 3+/5 3+/5 3+/5   Elbow extension 3/5 3+5 4-/5 4-/5   Wrist flexion       Wrist extension 3/5 3/5 3/5 3/5   Wrist ulnar deviation       Wrist radial deviation       Wrist pronation       Wrist supination       (Blank rows = not tested)  HAND FUNCTION:   Eval: Grip strength: Right: 50 lbs; Left: 34 lbs, Lateral pinch: Right: 17 lbs, Left: 9 lbs, 3 point pinch: Right: 12 lbs, Left: 11 lbs  11/09/23: Grip strength: Right: 60 lbs; Left: 35 lbs, Lateral pinch: Right: 19 lbs, Left: 11 lbs, 3 point pinch: Right: 13 lbs, Left: 9 lbs  01/04/24:Grip strength: Right: 40 lbs; Left: 41 lbs, Lateral pinch: Right: 17 lbs, Left: 8 lbs, 3 point pinch: Right: 9 lbs, Left: 10 lbs  COORDINATION:  Eval: 9 Hole Peg test: Right: 1 min. & 46 sec; Left: 53 sec  11/09/2023: 9 Hole Peg test: Right: 1 min. & 22 sec; Left: 46 sec  01/04/24: 9 Hole Peg test: Right: 1 min. & 11 sec; Left: 46 sec     SENSATION:  Light touch: WFL Proprioception: WFL  EDEMA: N/A  COGNITION: Overall cognitive status: Within functional limits for tasks assessed  VISION:  Does not wear glasses.  VISION ASSESSMENT: To be further assessed in functional context  PERCEPTION: WFL  PRAXIS: Impaired: Motor planning                                                                                                                         TREATMENT DATE: 01/14/24  Measurements were obtained, and goals were reviewed with the Pt.  PATIENT  EDUCATION: Education details: UE ROM, ADL functional status Person educated: Patient Education method: Explanation, Demonstration, Tactile cues, and Verbal cues Education comprehension: needs further education  HOME  EXERCISE PROGRAM:  Continue to assess, and provide as indicated.   GOALS: Goals reviewed with patient? Yes  SHORT TERM GOALS: Target date: 02/15/2024      Pt. Will be independent with HEPs for RUE. Baseline: 01/04/24: Independent 11/09/2023: Independent Eval: No current HEP Goal status:Ongoing  LONG TERM GOALS: Target date: 03/28/2024  1.  Pt. Will increase right shoulder abduction by 10 degrees to assist with UE dressing Baseline: 01/04/24: 16(109) Pt. reports having donning his shirt is easier with his new routine 11/09/23: Right 60/(110) Eval: Right 52(100) Goal status: Ongoing  2.  Pt. Will increase BUE strength by 2 mm grades to assist with ADLs. Baseline: 01/04/24:  Right: shoulder flexion: 3-/5, abduction: 2/5, elbow flexion 4-/5, extension: 3+/5, wrist extension: 3/5. Left: 4/5 overallEval: Right: shoulder flexion: 3/5, abduction: 2/5, elbow flexion/extension: 3/5, wrist extension: 3/5. Left: 4-/5 overall4/08/25: Right: shoulder flexion: 3-/5, abduction: 2/5, elbow flexion/extension: 3+/5, wrist extension: 3/5. Left: 4/5 overallEval: Right: shoulder flexion: 3/5, abduction: 2/5, elbow flexion/extension: 3/5, wrist extension: 3/5. Left: 4-/5 overall Goal status: Ongoing  3.  Pt. Will perform self-feeding with modified independence Baseline: 01/04/24: Pt. reports making improvements with self-feeding. Pt. has difficulty at times with bringing his spoon to his mouth, often bringing an empty spoon to his mouth 11/09/23: Pt. Continues to have difficulty using dominant right hand. Drops food when scooping, Both hands for stabilizing drinks, and uses a straw. Unable to cut food. Eval: Difficulty using dominant right hand. Drops food when scooping, Both hands for stabilizing  drinks, and uses a straw. Unable to cut food. Goal status: Ongoing  4.  Pt. Will demonstrate independence with proper A/E techniques/compensatory strategies for ADL/IADLs.  Baseline: 01/04/24: Continue ongoing as needed. 11/09/23: Continue Eval: Education to be provided Goal status: Ongoing  5.  Pt. Will write a sentence with 75% legibility with modified independence Baseline: 01/04/24:  One sentence completed in 2 min. & 51 sec. With 50% legibility.11/09/2023: 50% legibility for one sentence. Eval: 50% legibility for one printed sentence Goal status: Ongoing   6.  Pt. Will improve bilateral [facility] skills by 3 sec. on the 9 Hole Peg Test to be able to manipulate small objects.  Baseline: 9 Hole Peg Test: R: 1 min. & 11 sec.  L: 46 sec. 11/09/2023: 9 Hole Peg test: Right: 1 min. & 22 sec; Left: 46 sec Eval: 9 Hole Peg test: Right: 1 min. & 46 sec; Left: 53 sec Goal status:New  7. Pt. Will improve the Mam-20 score by 5 points to reflect ADL/IADL improvement. Baseline: Mam-20 sum score: 53/80, Mam Measure score: 52.4 Goal status New   ASSESSMENT:  CLINICAL IMPRESSION:  Pt. is making progress with right shoulder ROM, left grip, and pinch strength, and right hand [facility] skills. Pt. has progressed with left hand [facility] using the 9-Hole Peg Test by 11 sec. of speed. Pt. reports that he is has improved with UE dressing skills using a modified technique, and has progressed with holding, a spoon for self-feeding. Pt. Continues to present with overall weakness, and expresses frustration with everything taking longer to perform. Pt. Reports having had an increase in his medication, however reports being discouraged about having to take more, and would like to get to the point of not needing it. Pt. continues to benefit from OT services to work on improving overall bilateral UE functioning in order to maximize engagement in, and efficiency in ADLs, and IADL tasks, and provide education about compensatory strategies.     PERFORMANCE DEFICITS: in  functional skills including ADLs, IADLs, coordination, dexterity, proprioception, sensation, ROM, strength, pain, Fine motor control, Gross motor control, and UE functional use, cognitive skills including , and psychosocial skills including coping strategies, environmental adaptation, and routines and behaviors.   IMPAIRMENTS: are limiting patient from ADLs, IADLs, and leisure.   CO-MORBIDITIES: may have co-morbidities  that affects occupational performance. Patient will benefit from skilled OT to address above impairments and improve overall function.  MODIFICATION OR ASSISTANCE TO COMPLETE EVALUATION: Min-Moderate modification of tasks or assist with assess necessary to complete an evaluation.  OT OCCUPATIONAL PROFILE AND HISTORY: Detailed assessment: Review of records and additional review of physical, cognitive, psychosocial history related to current functional performance.  CLINICAL DECISION MAKING: Moderate - several treatment options, min-mod task modification necessary  REHAB POTENTIAL: Good  EVALUATION COMPLEXITY: Moderate    PLAN:  OT FREQUENCY: 2x/week  OT DURATION: 12 weeks  PLANNED INTERVENTIONS: 97168 OT Re-evaluation, 97535 self care/ADL training, 16109 therapeutic exercise, 97530 therapeutic activity, 97112 neuromuscular re-education, 97140 manual therapy, 97018 paraffin, 60454 moist heat, 97034 contrast bath, 97760 Orthotics management and training, 09811 Splinting (initial encounter), energy conservation, patient/family education, and DME and/or AE instructions  RECOMMENDED OTHER SERVICES:   PT  CONSULTED AND AGREED WITH PLAN OF CARE: Patient  PLAN FOR NEXT SESSION:   Treatment  Amardeep Beckers, MS, OTR/L   01/04/2024, 1:43 PM

## 2024-01-06 ENCOUNTER — Ambulatory Visit

## 2024-01-06 ENCOUNTER — Ambulatory Visit: Admitting: Physical Therapy

## 2024-01-06 NOTE — Therapy (Incomplete)
 OUTPATIENT PHYSICAL THERAPY NEURO TREATMENT  Physical Therapy Progress Note   Dates of reporting period  11/09/2023   to   01/06/2024   Patient Name: Daniel Daugherty MRN: [id number] DOB:05-05-1966, 58 y.o., male Today's Date: 01/06/2024   PCP: Tawnya Fava, Waddell Guess, MD  REFERRING PROVIDER: Tawnya Fava, Waddell Guess, MD   END OF SESSION:  ***     Past Medical History:  Diagnosis Date   Hypertension    Past Surgical History:  Procedure Laterality Date   DG HAND LEFT COMPLETE ([facility] HX) Left 04/13/1999   There are no active problems to display for this patient.   ONSET DATE: 2022 is when he noticed the change and that was around the same time he was diagnosed with Parkinson's  REFERRING DIAG:  G20.A1 (ICD-10-CM) - Parkinson disease (HCC)  Z74.1 (ICD-10-CM) - Requires daily assistance for activities of daily living (ADL) and comfort needs   THERAPY DIAG: *** No diagnosis found.  Rationale for Evaluation and Treatment: Rehabilitation  SUBJECTIVE:                                                                                                                                                                                             SUBJECTIVE STATEMENT:   ***  Pt reports he has been doing "alright."  States his son turned 34y.o. this past Sunday and they had a cookout to celebrate. Pt reports he has gotten lazy since last session, not performing his HEP, and has been "kicking back." However, later states he has been encouraging himself to stay active and walk every day using his walker as needed  Pt reports he still hasn't called Neurology office to set-up an appointment. Pt reports he stopped coming to therapy because he felt like he wasn't getting any better and it was discouraging. Reports he doesn't feel safe trying to do the things at home that he does at therapy. Therapist educated pt that he is only to do the prescribed HEP at home and that the focus of therapy  sessions is to challenge him to perform tasks that he is not safe to perform without a skilled physical therapist in order to advance his mobility.  Pt states he is ready to get better and he is frustrated with his CLOF. Pt reports he has looked up some things on social media for support and to find ideas on how to move more safely and independently. Pt sates the idea of the [employer] group is intimidating to him and he is concerned he will get frustrated by his inability to do as well as other people in  the group.   Pt sates he is trying to get dressed faster, but when he times himself he hasn't increased his speed. Pt states when he gets flustered and is trying to move fast, then his tremor increase and he has to stop and relax to get them to calm back down.  Pt states he knows he needs to keep coming to therapy, but he isn't improving as fast as he wants to. Pt states he doesn't want to keep missing out on life events because of his impairments. But he can't tell how the improvements he is making in therapy are improving his every day life. Despite this, pt in agreement to complete re-certification at this time to allow him to continue with therapy because he wants to get better. Therapist educated pt on goal to focus more on specific functional mobility tasks he is having difficulty with at home.  Denies stumbles/falls since last session.   ***  Pt accompanied by: self  PERTINENT HISTORY: Parkinsonism, Vitamin D deficiency, Hyperlipidemia, HTN, poor medication compliance (per chart review)  PAIN:  Are you having pain? Yes: NPRS scale: 5/10, but states "it ain't that bad" Pain location: R shoulder and low back Pain description: throbbing in shoulder Aggravating factors: overhead movements or reaching back Relieving factors: rest and at home uses a "massager"  PRECAUTIONS: Fall  RED FLAGS: None   WEIGHT BEARING RESTRICTIONS: No  FALLS: Has patient fallen in last 6 months?  Yes. 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   PATIENT GOALS: Get my balance right with my walking, help get my R arm stronger and be able to move it more, improve strength in my legs (specifically the Right)  OBJECTIVE:  Note: Objective measures were completed at Evaluation unless otherwise noted.  DIAGNOSTIC FINDINGS: N/A  COGNITION: Overall cognitive status: Within functional limits for tasks assessed; however, did not assess higher level cognitive tasks   SENSATION: WFL Light touch on screen Pt reports R foot falls asleep sitting on toilet and he has difficult time getting feeling back in it before feeling safe standing up.  COORDINATION: Overall bradykinesia with decreased speed and amplitude of movements  EDEMA:  None present  MUSCLE TONE: Not formally assessed  MUSCLE LENGTH: Not formally assessed, but may have some hamstring tightness on R associated with quad weakness and inability to achieve full knee extension in sitting  DTRs:  Not assessed  POSTURE: rounded shoulders, forward head, increased thoracic kyphosis, posterior pelvic tilt, and flexed trunk    LOWER EXTREMITY ROM:     Active ROM Right Eval Left Eval  Hip flexion Decreased in sitting compared to L   Hip extension    Hip abduction    Hip adduction    Hip internal rotation    Hip external rotation    Knee flexion    Knee  extension Lacks full knee extension in sitting   Ankle dorsiflexion Limited in sitting Limited in sitting  Ankle plantarflexion  Ankle inversion    Ankle eversion     (Blank rows = not tested)  LOWER EXTREMITY MMT:    MMT Right Eval Left Eval  Hip flexion 3- 4+  Hip extension    Hip abduction    Hip adduction    Hip internal rotation    Hip external rotation    Knee flexion 3 4+  Knee extension 3- 4+  Ankle dorsiflexion 3- 4+  Ankle plantarflexion 3- 4+  Ankle inversion    Ankle eversion    (Blank rows = not tested)  Manual Muscle Test Scale 0/5 = No muscle contraction can be seen or felt 1/5 = Contraction can be felt, but there is no motion 2-/5 = Part moves through incomplete ROM w/ gravity decreased 2/5 = Part moves through complete ROM w/ gravity decreased 2+/5 = Part moves through incomplete ROM (<50%) against gravity or through complete ROM w/ gravity 3-/5 = Part moves through incomplete ROM (>50%) against gravity 3/5 = Part moves through complete ROM against gravity 3+/5 = Part moves through complete ROM against gravity/slight resistance 4-/5= Holds test position against slight to moderate pressure 4/5 = Part moves through complete ROM against gravity/moderate resistance 4+/5= Holds test position against moderate to strong pressure 5/5 = Part moves through complete ROM against gravity/full resistance  BED MOBILITY:  Sit to supine   Supine to sit   Need to assess  ADL: Reports dificulty getitng on R shoe, specifically having trouble pushing his foot down in his shoe  TRANSFERS: Assistive device utilized: Hurry cane  Sit to stand: Modified independence and SBA Stand to sit: Modified independence and SBA Chair to chair: Modified independence and SBA Floor: would benefit from testing in future  RAMP:  Level of Assistance:   Assistive device utilized: Hurry cane Ramp Comments: would benefit from assessing  CURB:  Level of Assistance:   Assistive device  utilized: [employer] Comments: would benefit from assessing  STAIRS: Level of Assistance: CGA and Min A Stair Negotiation Technique: Step to Pattern Forwards with Bilateral Rails Number of Stairs: 4  Height of Stairs: 6in  Comments: leading with R LE in both directions, attempts reciprocal pattern on ascent with sufficient strength, but having uncontrolled anterior lean during descent  GAIT: Gait pattern: step through pattern, decreased arm swing- Right, decreased arm swing- Left, decreased step length- Right, decreased step length- Left, decreased stride length, decreased hip/knee flexion- Right, decreased hip/knee flexion- Left, decreased ankle dorsiflexion- Right, decreased ankle dorsiflexion- Left, decreased trunk rotation, trunk flexed, poor foot clearance- Right, and poor foot clearance- Left Distance walked: 169ft Assistive device utilized: None Level of assistance: CGA Comments: significantly decreased  FUNCTIONAL TESTS:  5 times sit to stand: 38.53 seconds arms across chest Timed up and go (TUG): 19.46 seconds without AD 6 minute walk test: 10/04/2023: 561ft (163m at 0.33m/s avg) 10 meter walk test: 0.56 m/s without AD Mini-Best: 10/04/2023: 8/28  PATIENT SURVEYS:  ABC scale 17.5% with pt feeling <40% confident on all of the items, pt feels only 30% confident walking around the house, 10% confident getting in/out of car, and 20% reaching for items  OCULOMOTOR EXAM from 11/16/2023 - performed due to Neurology note reporting concerns of decreased vertical gaze:  Ocular Alignment: normal  Ocular ROM: No Limitations  Spontaneous Nystagmus: absent  Gaze-Induced Nystagmus: absent  Smooth Pursuits: intact, good vertical movements with full range  Horizontal and Vertical Saccades: extra eye movements, slow, and frequently blinks, a few extra eye movements when moving  to superior target                                                                                                                                TREATMENT DATE: 01/06/24   ***  *PN at next visit* - complete 6 min walk test and MiniBest **focus on functional mobility tasks patient reports as challenging**    Therapist provides extensive education during session on importance of continuing to stay active at home while maintaining his safety. Encouraged pt to continue walking every day as well as doing sit<>stands at home and HEP stretching exercises as he feels he can. Therapist provided encouragement and education on his progress made in therapy thus far and plan to continue addressing the functional mobility tasks he finds to be challenging in order to help him be more independent.  Therapist provides encouragement for patient to follow-up with movement specialist neurology MD to have medical support for his diagnosis and pt in agreement with therapist calling the neurology office to have them reach out and set-up an appointment.   ***   Unless otherwise stated, CGA/light min A was provided and gait belt donned in order to ensure pt safety throughout session.  Therapy session focused on re-assessment of standardized outcome measures and subjective questionnaire to determine pt's progress with therapy thus far for re-certification.  Participated in Timed Up and Go (TUG): 1st trial: 16.32 seconds 2nd trial: 15.37 seconds  Average: 15.845 seconds no AD with close SBA for safety Patient demonstrates high fall risk as indicated by requiring >13.5seconds to complete the TUG.    Five times Sit to Stand Test (FTSS) "Stand up and sit down as quickly as possible 5 times, keeping your arms folded across your chest."    TIME: 20.12 seconds from green chair, no UE support  Times > 13.6 seconds is associated with increased disability and morbidity (Guralnik, 2000) Times > 15 seconds is predictive of recurrent falls in healthy individuals aged 4 and older (Buatois, et al., 2008) Normal performance values in  community dwelling individuals aged 19 and older (Bohannon, 2006): 60-69 years: 11.4 seconds 70-79 years: 12.6 seconds 80-89 years: 14.8 seconds  MCID: >= 2.3 seconds for Vestibular Disorders (Meretta, 2006)   10 Meter Walk Test: Patient instructed to walk 10 meters (32.8 ft) as quickly and as safely as possible x2. Time measured from 2 meter mark to 8 meter mark to accommodate ramp-up and ramp-down.  Fast speed 1: 0.89 m/s (11.17 seconds) Fast speed 2: 0.98 m/s (10.17 seconds) Average Fast speed: 0.935 m/s without AD, CGA/close SBA for safety Cut off scores: <0.4 m/s = household Ambulator, 0.4-0.8 m/s = limited community Ambulator, >0.8 m/s = community Ambulator, >1.2 m/s = crossing a street, <1.0 = increased fall risk MCID 0.05 m/s (small), 0.13 m/s (moderate), 0.06 m/s (significant)  (ANPTA Core Set of Outcome Measures for Adults with Neurologic Conditions, 2018)   Activities-specific Balance Confidence Scale:  Score:  35.625% with pt continuing to rate these activities without his walker because that is his long term goal - pt rates most items between 30-40% with walking around house as 60% Increased risk of falls in community-dwelling, older adults <80% (79.89%)  0% = no confidence - 100% = complete confidence (ANPTA Core Set of Outcome Measures for Adults with Neurologic Conditions, 2018)  Educated pt on results of assessments performed today and his continued progress due to him being intentional to stay active at home since last visit.    ***  PATIENT EDUCATION: Education details: exercise technique Person educated: Patient Education method: Explanation Education comprehension: verbalized understanding and needs further education  HOME EXERCISE PROGRAM:  Access Code: [email or web address] URL: [email or web address] Date: 12/02/2023 Prepared by: Carlen Chasten  Exercises - Sidelying Thoracic Rotation with Open Book  - 1 x daily - 7 x weekly - 2 sets - 10 reps - Supine  Shoulder External Rotation in Abduction  - 1 x daily - 7 x weekly - 2 sets - 10 reps - 10 seconds hold - Seated Scapular Retraction  - 1 x daily - 7 x weekly - 3 sets - 10 reps - 3 seconds hold - Sit to Stand with Arm Swing  - 1 x daily - 7 x weekly - 2 sets - 10 reps   GOALS: Goals reviewed with patient? Yes  SHORT TERM GOALS: Target date: 02/15/2024    Patient will be independent in home exercise program to improve strength/mobility for better functional independence with ADLs. Baseline: need to initiate 10/07/2023: provided 12/02/2023: updated Goal status: IN PROGRESS   LONG TERM GOALS: Target date: 03/28/2024   1.  Patient (< 32 years old) will complete five times sit to stand test in < 12 seconds indicating an increased LE strength and improved balance. Baseline: 09/28/23: 38.53 seconds 11/09/2023: 22.13 seconds with arms across chest 01/04/2024: 20.12 seconds without UE support Goal status: IN PROGRESS   2. Patient will reduce timed up and go to <11 seconds to reduce fall risk and demonstrate improved transfer/gait ability. Baseline: 09/28/23: 19.46 seconds without AD 11/09/2023: 16.96 seconds, no AD, with close SBA for balance safety 01/04/2024: 15.845 seconds, no AD with close SBA for safety Goal status: IN PROGRESS  3.  Patient will increase 10 meter walk test to >1.5m/s as to improve gait speed for better community ambulation and to reduce fall risk. Baseline: 09/28/23: 0.62m/s without AD 11/09/2023: 0.89 m/s (11.50 seconds seconds 10.94 seconds) no AD, with close SBA for safety 01/04/2024: 0.935 m/s without AD, CGA/close SBA for safety Goal status: IN PROGRESS  4.  Patient will increase six minute walk test distance to >1078ft for progression to community ambulator and improve gait ability Baseline: 10/04/2023: 5105ft (178m at 0.35m/s avg) 11/09/2023: 1061 feet (323 meters, Avg speed 0.897 m/s) without AD Goal status: MET and Advanced 11/09/2023 Goal Updated: distance > 1357ft 01/06/2024:  ***   5. Patient will increase MiniBest Test score to >18/28 to indicate a reduced risk for falling and demonstrate increased independence with functional mobility and ADLs.  Baseline: 10/04/2023: 8/28  11/16/2023: 16/28  01/06/2024: ***  Goal status: IN PROGRESS  6. Patient will increase ABC scale score >80% to demonstrate better functional mobility and better confidence with ADLs.   Baseline: 09/28/23: 17.5% 11/09/2023: 21.25% - pt reports he rated the test as is if he was not using his walker because this is his long term goal  01/04/2024: 35.625% - continuing to rate it as if he is  not using his walker  Goal status: IN PROGRESS   ASSESSMENT:  CLINICAL IMPRESSION:  *** Patient arrives to session with reports of overall decreased mood and feeling frustrated with his CLOF and not seeing improvements as quickly as he would like. Therapist provides emotional support and encouragement on his progress towards LTGs thus far during therapy. Also, performed re-assessment of standardized outcome measures and subjective questionnaire to determine pt's continued progress for re-certification today. Patient continues to demo improvements on 5xSTS, TUG, and . However, patient reports he cannot see these benefits translating into his every day life; therefore, will benefit from more specific interventions targeting functional mobility tasks that pt reports as challenging. Patient remains eager to progress his functional mobility to remain independent. Mr. Ned will benefit from continued skilled PT to improve these deficits in order to increase QOL and ease/safety with ADLs and functional mobility as well as decrease risk for falls. *** Patient's condition has the potential to improve in response to therapy. Maximum improvement is yet to be obtained. The anticipated improvement is attainable and reasonable in a generally predictable time.  ***  OBJECTIVE IMPAIRMENTS: Abnormal gait, decreased activity tolerance,  decreased balance, decreased coordination, decreased endurance, decreased knowledge of condition, decreased knowledge of use of DME, decreased mobility, difficulty walking, decreased ROM, decreased strength, decreased safety awareness, improper body mechanics, postural dysfunction, and pain.   ACTIVITY LIMITATIONS: carrying, lifting, bending, standing, squatting, sleeping, stairs, transfers, bed mobility, continence, bathing, toileting, dressing, reach over head, hygiene/grooming, and locomotion level  PARTICIPATION LIMITATIONS: meal prep, cleaning, laundry, and community activity  PERSONAL FACTORS: Fitness and 3+ comorbidities: Vitamin D deficiency, Hyperlipidemia, and HTN are also affecting patient's functional outcome.   REHAB POTENTIAL: Good  CLINICAL DECISION MAKING: Evolving/moderate complexity  EVALUATION COMPLEXITY: Moderate  PLAN:  PT FREQUENCY: 1-2x/week  PT DURATION: 12 weeks  PLANNED INTERVENTIONS: 97164- PT Re-evaluation, 97110-Therapeutic exercises, 97530- Therapeutic activity, 97112- Neuromuscular re-education, 97535- Self Care, 40981- Manual therapy, [phone]- Gait training, [phone]- Orthotic Fit/training, [phone]- Canalith repositioning, [phone]- Electrical stimulation (manual), Patient/Family education, Balance training, Stair training, Joint mobilization, Vestibular training, Visual/preceptual remediation/compensation, DME instructions, Cryotherapy, and Moist heat  PLAN FOR NEXT SESSION: *** *PN at next visit* - complete 6 min walk test and MiniBest **focus on functional mobility tasks patient reports as challenging** - review updated HEP  - gait training with increased intensity - dynamic gait with focus on increased upright posture and speed of ambulation with arm swing (add AWs to UEs with boxing gloves)  - add dual-task challenges of head rotations, stepping over obstacles, sudden turns, and changes in gait speed - increased focus on turns - stepping in different  directions   - with dual-task of Blaze Pods to promote increased speed/amplitude of movements  - continue arm reaching and trunk rotation (specifically to R) - standing balance: narrow BOS/SLS, on compliant surfaces, incline, eyes open&eyes closed - stepping strategy re-training: focus on posterior stepping - large amplitude UE movements with promotion of improved upright posture - B LE strengthening (R>L)  - progress to floor transfers    Carlen Chasten, PT, DPT, NCS, CSRS Physical Therapist - [facility]  [facility]  7:53 AM 01/06/24

## 2024-01-11 ENCOUNTER — Ambulatory Visit: Admitting: Physical Therapy

## 2024-01-11 ENCOUNTER — Ambulatory Visit

## 2024-01-11 ENCOUNTER — Telehealth: Payer: Self-pay | Admitting: Physical Therapy

## 2024-01-11 NOTE — Telephone Encounter (Signed)
 Pt contacted via telephone and author left voice mail informing of missed appointment and informed pt of future PT appointment date and time.   Casimiro Needle, PT, DPT, NCS, CSRS

## 2024-01-11 NOTE — Therapy (Incomplete)
 OUTPATIENT PHYSICAL THERAPY NEURO TREATMENT  Physical Therapy Progress Note   Dates of reporting period  11/09/2023   to   01/11/2024   Patient Name: Daniel Daugherty MRN: [id number] DOB:06/28/1966, 58 y.o., male Today's Date: 01/11/2024   PCP: Tawnya Fava, Waddell Guess, MD  REFERRING PROVIDER: Tawnya Fava, Waddell Guess, MD   END OF SESSION:  ***     Past Medical History:  Diagnosis Date   Hypertension    Past Surgical History:  Procedure Laterality Date   DG HAND LEFT COMPLETE ([facility] HX) Left 04/13/1999   There are no active problems to display for this patient.   ONSET DATE: 2022 is when he noticed the change and that was around the same time he was diagnosed with Parkinson's  REFERRING DIAG:  G20.A1 (ICD-10-CM) - Parkinson disease (HCC)  Z74.1 (ICD-10-CM) - Requires daily assistance for activities of daily living (ADL) and comfort needs   THERAPY DIAG: *** No diagnosis found.  Rationale for Evaluation and Treatment: Rehabilitation  SUBJECTIVE:                                                                                                                                                                                             SUBJECTIVE STATEMENT:   ***  Pt reports he has been doing "alright."  States his son turned 34y.o. this past Sunday and they had a cookout to celebrate. Pt reports he has gotten lazy since last session, not performing his HEP, and has been "kicking back." However, later states he has been encouraging himself to stay active and walk every day using his walker as needed  Pt reports he still hasn't called Neurology office to set-up an appointment. Pt reports he stopped coming to therapy because he felt like he wasn't getting any better and it was discouraging. Reports he doesn't feel safe trying to do the things at home that he does at therapy. Therapist educated pt that he is only to do the prescribed HEP at home and that the focus of therapy  sessions is to challenge him to perform tasks that he is not safe to perform without a skilled physical therapist in order to advance his mobility.  Pt states he is ready to get better and he is frustrated with his CLOF. Pt reports he has looked up some things on social media for support and to find ideas on how to move more safely and independently. Pt sates the idea of the [employer] group is intimidating to him and he is concerned he will get frustrated by his inability to do as well as other people in  the group.   Pt sates he is trying to get dressed faster, but when he times himself he hasn't increased his speed. Pt states when he gets flustered and is trying to move fast, then his tremor increase and he has to stop and relax to get them to calm back down.  Pt states he knows he needs to keep coming to therapy, but he isn't improving as fast as he wants to. Pt states he doesn't want to keep missing out on life events because of his impairments. But he can't tell how the improvements he is making in therapy are improving his every day life. Despite this, pt in agreement to complete re-certification at this time to allow him to continue with therapy because he wants to get better. Therapist educated pt on goal to focus more on specific functional mobility tasks he is having difficulty with at home.  Denies stumbles/falls since last session.   ***  Pt accompanied by: self  PERTINENT HISTORY: Parkinsonism, Vitamin D deficiency, Hyperlipidemia, HTN, poor medication compliance (per chart review)  PAIN:  Are you having pain? Yes: NPRS scale: 5/10, but states "it ain't that bad" Pain location: R shoulder and low back Pain description: throbbing in shoulder Aggravating factors: overhead movements or reaching back Relieving factors: rest and at home uses a "massager"  PRECAUTIONS: Fall  RED FLAGS: None   WEIGHT BEARING RESTRICTIONS: No  FALLS: Has patient fallen in last 6 months?  Yes. 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   PATIENT GOALS: Get my balance right with my walking, help get my R arm stronger and be able to move it more, improve strength in my legs (specifically the Right)  OBJECTIVE:  Note: Objective measures were completed at Evaluation unless otherwise noted.  DIAGNOSTIC FINDINGS: N/A  COGNITION: Overall cognitive status: Within functional limits for tasks assessed; however, did not assess higher level cognitive tasks   SENSATION: WFL Light touch on screen Pt reports R foot falls asleep sitting on toilet and he has difficult time getting feeling back in it before feeling safe standing up.  COORDINATION: Overall bradykinesia with decreased speed and amplitude of movements  EDEMA:  None present  MUSCLE TONE: Not formally assessed  MUSCLE LENGTH: Not formally assessed, but may have some hamstring tightness on R associated with quad weakness and inability to achieve full knee extension in sitting  DTRs:  Not assessed  POSTURE: rounded shoulders, forward head, increased thoracic kyphosis, posterior pelvic tilt, and flexed trunk    LOWER EXTREMITY ROM:     Active ROM Right Eval Left Eval  Hip flexion Decreased in sitting compared to L   Hip extension    Hip abduction    Hip adduction    Hip internal rotation    Hip external rotation    Knee flexion    Knee  extension Lacks full knee extension in sitting   Ankle dorsiflexion Limited in sitting Limited in sitting  Ankle plantarflexion  Ankle inversion    Ankle eversion     (Blank rows = not tested)  LOWER EXTREMITY MMT:    MMT Right Eval Left Eval  Hip flexion 3- 4+  Hip extension    Hip abduction    Hip adduction    Hip internal rotation    Hip external rotation    Knee flexion 3 4+  Knee extension 3- 4+  Ankle dorsiflexion 3- 4+  Ankle plantarflexion 3- 4+  Ankle inversion    Ankle eversion    (Blank rows = not tested)  Manual Muscle Test Scale 0/5 = No muscle contraction can be seen or felt 1/5 = Contraction can be felt, but there is no motion 2-/5 = Part moves through incomplete ROM w/ gravity decreased 2/5 = Part moves through complete ROM w/ gravity decreased 2+/5 = Part moves through incomplete ROM (<50%) against gravity or through complete ROM w/ gravity 3-/5 = Part moves through incomplete ROM (>50%) against gravity 3/5 = Part moves through complete ROM against gravity 3+/5 = Part moves through complete ROM against gravity/slight resistance 4-/5= Holds test position against slight to moderate pressure 4/5 = Part moves through complete ROM against gravity/moderate resistance 4+/5= Holds test position against moderate to strong pressure 5/5 = Part moves through complete ROM against gravity/full resistance  BED MOBILITY:  Sit to supine   Supine to sit   Need to assess  ADL: Reports dificulty getitng on R shoe, specifically having trouble pushing his foot down in his shoe  TRANSFERS: Assistive device utilized: Hurry cane  Sit to stand: Modified independence and SBA Stand to sit: Modified independence and SBA Chair to chair: Modified independence and SBA Floor: would benefit from testing in future  RAMP:  Level of Assistance:   Assistive device utilized: Hurry cane Ramp Comments: would benefit from assessing  CURB:  Level of Assistance:   Assistive device  utilized: [employer] Comments: would benefit from assessing  STAIRS: Level of Assistance: CGA and Min A Stair Negotiation Technique: Step to Pattern Forwards with Bilateral Rails Number of Stairs: 4  Height of Stairs: 6in  Comments: leading with R LE in both directions, attempts reciprocal pattern on ascent with sufficient strength, but having uncontrolled anterior lean during descent  GAIT: Gait pattern: step through pattern, decreased arm swing- Right, decreased arm swing- Left, decreased step length- Right, decreased step length- Left, decreased stride length, decreased hip/knee flexion- Right, decreased hip/knee flexion- Left, decreased ankle dorsiflexion- Right, decreased ankle dorsiflexion- Left, decreased trunk rotation, trunk flexed, poor foot clearance- Right, and poor foot clearance- Left Distance walked: 177ft Assistive device utilized: None Level of assistance: CGA Comments: significantly decreased  FUNCTIONAL TESTS:  5 times sit to stand: 38.53 seconds arms across chest Timed up and go (TUG): 19.46 seconds without AD 6 minute walk test: 10/04/2023: 565ft (120m at 0.15m/s avg) 10 meter walk test: 0.56 m/s without AD Mini-Best: 10/04/2023: 8/28  PATIENT SURVEYS:  ABC scale 17.5% with pt feeling <40% confident on all of the items, pt feels only 30% confident walking around the house, 10% confident getting in/out of car, and 20% reaching for items  OCULOMOTOR EXAM from 11/16/2023 - performed due to Neurology note reporting concerns of decreased vertical gaze:  Ocular Alignment: normal  Ocular ROM: No Limitations  Spontaneous Nystagmus: absent  Gaze-Induced Nystagmus: absent  Smooth Pursuits: intact, good vertical movements with full range  Horizontal and Vertical Saccades: extra eye movements, slow, and frequently blinks, a few extra eye movements when moving  to superior target                                                                                                                                TREATMENT DATE: 01/11/24   ***  *PN at next visit* - complete 6 min walk test and MiniBest **focus on functional mobility tasks patient reports as challenging**    Therapist provides extensive education during session on importance of continuing to stay active at home while maintaining his safety. Encouraged pt to continue walking every day as well as doing sit<>stands at home and HEP stretching exercises as he feels he can. Therapist provided encouragement and education on his progress made in therapy thus far and plan to continue addressing the functional mobility tasks he finds to be challenging in order to help him be more independent.  Therapist provides encouragement for patient to follow-up with movement specialist neurology MD to have medical support for his diagnosis and pt in agreement with therapist calling the neurology office to have them reach out and set-up an appointment.   ***   Unless otherwise stated, CGA/light min A was provided and gait belt donned in order to ensure pt safety throughout session.  Therapy session focused on re-assessment of standardized outcome measures and subjective questionnaire to determine pt's progress with therapy thus far for re-certification.  Participated in Timed Up and Go (TUG): 1st trial: 16.32 seconds 2nd trial: 15.37 seconds  Average: 15.845 seconds no AD with close SBA for safety Patient demonstrates high fall risk as indicated by requiring >13.5seconds to complete the TUG.    Five times Sit to Stand Test (FTSS) "Stand up and sit down as quickly as possible 5 times, keeping your arms folded across your chest."    TIME: 20.12 seconds from green chair, no UE support  Times > 13.6 seconds is associated with increased disability and morbidity (Guralnik, 2000) Times > 15 seconds is predictive of recurrent falls in healthy individuals aged 102 and older (Buatois, et al., 2008) Normal performance values in  community dwelling individuals aged 89 and older (Bohannon, 2006): 60-69 years: 11.4 seconds 70-79 years: 12.6 seconds 80-89 years: 14.8 seconds  MCID: >= 2.3 seconds for Vestibular Disorders (Meretta, 2006)   10 Meter Walk Test: Patient instructed to walk 10 meters (32.8 ft) as quickly and as safely as possible x2. Time measured from 2 meter mark to 8 meter mark to accommodate ramp-up and ramp-down.  Fast speed 1: 0.89 m/s (11.17 seconds) Fast speed 2: 0.98 m/s (10.17 seconds) Average Fast speed: 0.935 m/s without AD, CGA/close SBA for safety Cut off scores: <0.4 m/s = household Ambulator, 0.4-0.8 m/s = limited community Ambulator, >0.8 m/s = community Ambulator, >1.2 m/s = crossing a street, <1.0 = increased fall risk MCID 0.05 m/s (small), 0.13 m/s (moderate), 0.06 m/s (significant)  (ANPTA Core Set of Outcome Measures for Adults with Neurologic Conditions, 2018)   Activities-specific Balance Confidence Scale:  Score:  35.625% with pt continuing to rate these activities without his walker because that is his long term goal - pt rates most items between 30-40% with walking around house as 60% Increased risk of falls in community-dwelling, older adults <80% (79.89%)  0% = no confidence - 100% = complete confidence (ANPTA Core Set of Outcome Measures for Adults with Neurologic Conditions, 2018)  Educated pt on results of assessments performed today and his continued progress due to him being intentional to stay active at home since last visit.    ***  PATIENT EDUCATION: Education details: exercise technique Person educated: Patient Education method: Explanation Education comprehension: verbalized understanding and needs further education  HOME EXERCISE PROGRAM:  Access Code: [email or web address] URL: [email or web address] Date: 12/02/2023 Prepared by: Carlen Chasten  Exercises - Sidelying Thoracic Rotation with Open Book  - 1 x daily - 7 x weekly - 2 sets - 10 reps - Supine  Shoulder External Rotation in Abduction  - 1 x daily - 7 x weekly - 2 sets - 10 reps - 10 seconds hold - Seated Scapular Retraction  - 1 x daily - 7 x weekly - 3 sets - 10 reps - 3 seconds hold - Sit to Stand with Arm Swing  - 1 x daily - 7 x weekly - 2 sets - 10 reps   GOALS: Goals reviewed with patient? Yes  SHORT TERM GOALS: Target date: 02/15/2024    Patient will be independent in home exercise program to improve strength/mobility for better functional independence with ADLs. Baseline: need to initiate 10/07/2023: provided 12/02/2023: updated Goal status: IN PROGRESS   LONG TERM GOALS: Target date: 03/28/2024   1.  Patient (< 2 years old) will complete five times sit to stand test in < 12 seconds indicating an increased LE strength and improved balance. Baseline: 09/28/23: 38.53 seconds 11/09/2023: 22.13 seconds with arms across chest 01/04/2024: 20.12 seconds without UE support Goal status: IN PROGRESS   2. Patient will reduce timed up and go to <11 seconds to reduce fall risk and demonstrate improved transfer/gait ability. Baseline: 09/28/23: 19.46 seconds without AD 11/09/2023: 16.96 seconds, no AD, with close SBA for balance safety 01/04/2024: 15.845 seconds, no AD with close SBA for safety Goal status: IN PROGRESS  3.  Patient will increase 10 meter walk test to >1.87m/s as to improve gait speed for better community ambulation and to reduce fall risk. Baseline: 09/28/23: 0.73m/s without AD 11/09/2023: 0.89 m/s (11.50 seconds seconds 10.94 seconds) no AD, with close SBA for safety 01/04/2024: 0.935 m/s without AD, CGA/close SBA for safety Goal status: IN PROGRESS  4.  Patient will increase six minute walk test distance to >1037ft for progression to community ambulator and improve gait ability Baseline: 10/04/2023: 527ft (127m at 0.37m/s avg) 11/09/2023: 1061 feet (323 meters, Avg speed 0.897 m/s) without AD Goal status: MET and Advanced 11/09/2023 Goal Updated: distance > 1344ft 01/06/2024:  ***   5. Patient will increase MiniBest Test score to >18/28 to indicate a reduced risk for falling and demonstrate increased independence with functional mobility and ADLs.  Baseline: 10/04/2023: 8/28  11/16/2023: 16/28  01/06/2024: ***  Goal status: IN PROGRESS  6. Patient will increase ABC scale score >80% to demonstrate better functional mobility and better confidence with ADLs.   Baseline: 09/28/23: 17.5% 11/09/2023: 21.25% - pt reports he rated the test as is if he was not using his walker because this is his long term goal  01/04/2024: 35.625% - continuing to rate it as if he is  not using his walker  Goal status: IN PROGRESS   ASSESSMENT:  CLINICAL IMPRESSION:  *** Patient arrives to session with reports of overall decreased mood and feeling frustrated with his CLOF and not seeing improvements as quickly as he would like. Therapist provides emotional support and encouragement on his progress towards LTGs thus far during therapy. Also, performed re-assessment of standardized outcome measures and subjective questionnaire to determine pt's continued progress for re-certification today. Patient continues to demo improvements on 5xSTS, TUG, and . However, patient reports he cannot see these benefits translating into his every day life; therefore, will benefit from more specific interventions targeting functional mobility tasks that pt reports as challenging. Patient remains eager to progress his functional mobility to remain independent. Mr. Dowdell will benefit from continued skilled PT to improve these deficits in order to increase QOL and ease/safety with ADLs and functional mobility as well as decrease risk for falls. *** Patient's condition has the potential to improve in response to therapy. Maximum improvement is yet to be obtained. The anticipated improvement is attainable and reasonable in a generally predictable time.  ***  OBJECTIVE IMPAIRMENTS: Abnormal gait, decreased activity tolerance,  decreased balance, decreased coordination, decreased endurance, decreased knowledge of condition, decreased knowledge of use of DME, decreased mobility, difficulty walking, decreased ROM, decreased strength, decreased safety awareness, improper body mechanics, postural dysfunction, and pain.   ACTIVITY LIMITATIONS: carrying, lifting, bending, standing, squatting, sleeping, stairs, transfers, bed mobility, continence, bathing, toileting, dressing, reach over head, hygiene/grooming, and locomotion level  PARTICIPATION LIMITATIONS: meal prep, cleaning, laundry, and community activity  PERSONAL FACTORS: Fitness and 3+ comorbidities: Vitamin D deficiency, Hyperlipidemia, and HTN are also affecting patient's functional outcome.   REHAB POTENTIAL: Good  CLINICAL DECISION MAKING: Evolving/moderate complexity  EVALUATION COMPLEXITY: Moderate  PLAN:  PT FREQUENCY: 1-2x/week  PT DURATION: 12 weeks  PLANNED INTERVENTIONS: 97164- PT Re-evaluation, 97110-Therapeutic exercises, 97530- Therapeutic activity, 97112- Neuromuscular re-education, 97535- Self Care, 09811- Manual therapy, [phone]- Gait training, [phone]- Orthotic Fit/training, [phone]- Canalith repositioning, [phone]- Electrical stimulation (manual), Patient/Family education, Balance training, Stair training, Joint mobilization, Vestibular training, Visual/preceptual remediation/compensation, DME instructions, Cryotherapy, and Moist heat  PLAN FOR NEXT SESSION: *** *PN at next visit* - complete 6 min walk test and MiniBest **focus on functional mobility tasks patient reports as challenging** - review updated HEP  - gait training with increased intensity - dynamic gait with focus on increased upright posture and speed of ambulation with arm swing (add AWs to UEs with boxing gloves)  - add dual-task challenges of head rotations, stepping over obstacles, sudden turns, and changes in gait speed - increased focus on turns - stepping in different  directions   - with dual-task of Blaze Pods to promote increased speed/amplitude of movements  - continue arm reaching and trunk rotation (specifically to R) - standing balance: narrow BOS/SLS, on compliant surfaces, incline, eyes open&eyes closed - stepping strategy re-training: focus on posterior stepping - large amplitude UE movements with promotion of improved upright posture - B LE strengthening (R>L)  - progress to floor transfers    Carlen Chasten, PT, DPT, NCS, CSRS Physical Therapist - [facility]  [facility]  7:56 AM 01/11/24

## 2024-01-13 ENCOUNTER — Ambulatory Visit

## 2024-01-13 ENCOUNTER — Telehealth: Payer: Self-pay | Admitting: Occupational Therapy

## 2024-01-13 ENCOUNTER — Ambulatory Visit: Admitting: Occupational Therapy

## 2024-01-13 DIAGNOSIS — R278 Other lack of coordination: Secondary | ICD-10-CM

## 2024-01-13 DIAGNOSIS — R262 Difficulty in walking, not elsewhere classified: Secondary | ICD-10-CM

## 2024-01-13 DIAGNOSIS — M6281 Muscle weakness (generalized): Secondary | ICD-10-CM

## 2024-01-13 DIAGNOSIS — R2681 Unsteadiness on feet: Secondary | ICD-10-CM

## 2024-01-13 NOTE — Therapy (Signed)
 OUTPATIENT PHYSICAL THERAPY NEURO TREATMENT     Patient Name: Daniel Daugherty MRN: [id number] DOB:Sep 29, 1965, 58 y.o., male Today's Date: 01/13/2024   PCP: Tawnya Fava, Waddell Guess, MD  REFERRING PROVIDER: Tawnya Fava, Waddell Guess, MD   END OF SESSION:    PT End of Session - 01/13/24 1403     Visit Number 20    Number of Visits 24    Date for PT Re-Evaluation 03/28/24    Authorization Type Medicare 2025    PT Start Time 1405    PT Stop Time 1424    PT Time Calculation (min) 19 min    Equipment Utilized During Treatment Gait belt    Activity Tolerance Patient tolerated treatment well    Behavior During Therapy WFL for tasks assessed/performed            Past Medical History:  Diagnosis Date   Hypertension    Past Surgical History:  Procedure Laterality Date   DG HAND LEFT COMPLETE ([facility] HX) Left 04/13/1999   There are no active problems to display for this patient.   ONSET DATE: 2022 is when he noticed the change and that was around the same time he was diagnosed with Parkinson's  REFERRING DIAG:  G20.A1 (ICD-10-CM) - Parkinson disease (HCC)  Z74.1 (ICD-10-CM) - Requires daily assistance for activities of daily living (ADL) and comfort needs   THERAPY DIAG:  Other lack of coordination  Difficulty in walking, not elsewhere classified  Muscle weakness (generalized)  Unsteadiness on feet  Rationale for Evaluation and Treatment: Rehabilitation  SUBJECTIVE:                                                                                                                                                                                             SUBJECTIVE STATEMENT: TODAY 01/13/24: Pt reports no stumbles/falls and no medication changes. Pt reports HEP is going well. He is having higher PD symptom day: less steady and more fatigue.    PREVIOUSLY-  Pt reports he has been doing alright.  States his son turned 34y.o. this past Sunday and they had a cookout to  celebrate. Pt reports he has gotten lazy since last session, not performing his HEP, and has been kicking back. However, later states he has been encouraging himself to stay active and walk every day using his walker as needed  Pt reports he still hasn't called Neurology office to set-up an appointment. Pt reports he stopped coming to therapy because he felt like he wasn't getting any better and it was discouraging. Reports he doesn't feel safe trying to do the things at home that he  does at therapy. Therapist educated pt that he is only to do the prescribed HEP at home and that the focus of therapy sessions is to challenge him to perform tasks that he is not safe to perform without a skilled physical therapist in order to advance his mobility.  Pt states he is ready to get better and he is frustrated with his CLOF. Pt reports he has looked up some things on social media for support and to find ideas on how to move more safely and independently. Pt sates the idea of the [employer] group is intimidating to him and he is concerned he will get frustrated by his inability to do as well as other people in the group.   Pt sates he is trying to get dressed faster, but when he times himself he hasn't increased his speed. Pt states when he gets flustered and is trying to move fast, then his tremor increase and he has to stop and relax to get them to calm back down.  Pt states he knows he needs to keep coming to therapy, but he isn't improving as fast as he wants to. Pt states he doesn't want to keep missing out on life events because of his impairments. But he can't tell how the improvements he is making in therapy are improving his every day life. Despite this, pt in agreement to complete re-certification at this time to allow him to continue with therapy because he wants to get better. Therapist educated pt on goal to focus more on specific functional mobility tasks he is having difficulty with at  home.  Denies stumbles/falls since last session.     Pt accompanied by: self  PERTINENT HISTORY: Parkinsonism, Vitamin D deficiency, Hyperlipidemia, HTN, poor medication compliance (per chart review)  PAIN:  Are you having pain? Yes: NPRS scale: 5/10, but states it ain't that bad Pain location: R shoulder and low back Pain description: throbbing in shoulder Aggravating factors: overhead movements or reaching back Relieving factors: rest and at home uses a massager  PRECAUTIONS: Fall  RED FLAGS: None   WEIGHT BEARING RESTRICTIONS: No  FALLS: Has patient fallen in last 6 months? Yes. 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   PATIENT GOALS: Get my balance right with my walking, help get my R arm stronger and be able to move it more, improve strength in my legs (specifically the Right)  OBJECTIVE:  Note: Objective measures were completed at Evaluation unless otherwise noted.  DIAGNOSTIC FINDINGS: N/A  COGNITION: Overall cognitive status: Within functional limits for tasks assessed; however, did not assess higher level cognitive tasks   SENSATION: WFL Light touch on screen Pt reports R foot falls asleep sitting on toilet and he has difficult time getting feeling back in it before feeling safe standing up.  COORDINATION: Overall bradykinesia with decreased speed and amplitude of  movements  EDEMA:  None present  MUSCLE TONE: Not formally assessed  MUSCLE LENGTH: Not formally assessed,  but may have some hamstring tightness on R associated with quad weakness and inability to achieve full knee extension in sitting  DTRs:  Not assessed  POSTURE: rounded shoulders, forward head, increased thoracic kyphosis, posterior pelvic tilt, and flexed trunk    LOWER EXTREMITY ROM:     Active ROM Right Eval Left Eval  Hip flexion Decreased in sitting compared to L   Hip extension    Hip abduction    Hip adduction    Hip internal rotation    Hip external rotation    Knee flexion    Knee extension Lacks full knee extension in sitting   Ankle dorsiflexion Limited in sitting Limited in sitting  Ankle plantarflexion    Ankle inversion    Ankle eversion     (Blank rows = not tested)  LOWER EXTREMITY MMT:    MMT Right Eval Left Eval  Hip flexion 3- 4+  Hip extension    Hip abduction    Hip adduction    Hip internal rotation    Hip external rotation    Knee flexion 3 4+  Knee extension 3- 4+  Ankle dorsiflexion 3- 4+  Ankle plantarflexion 3- 4+  Ankle inversion    Ankle eversion    (Blank rows = not tested)  Manual Muscle Test Scale 0/5 = No muscle contraction can be seen or felt 1/5 = Contraction can be felt, but there is no motion 2-/5 = Part moves through incomplete ROM w/ gravity decreased 2/5 = Part moves through complete ROM w/ gravity decreased 2+/5 = Part moves through incomplete ROM (<50%) against gravity or through complete ROM w/ gravity 3-/5 = Part moves through incomplete ROM (>50%) against gravity 3/5 = Part moves through complete ROM against gravity 3+/5 = Part moves through complete ROM against gravity/slight resistance 4-/5= Holds test position against slight to moderate pressure 4/5 = Part moves through complete ROM against gravity/moderate resistance 4+/5= Holds test position against moderate to strong pressure 5/5 = Part moves  through complete ROM against gravity/full resistance  BED MOBILITY:  Sit to supine   Supine to sit   Need to assess  ADL: Reports dificulty getitng on R shoe, specifically having trouble pushing his foot down in his shoe  TRANSFERS: Assistive device utilized: Hurry cane  Sit to stand: Modified independence and SBA Stand to sit: Modified independence and SBA Chair to chair: Modified independence and SBA Floor: would benefit from testing in future  RAMP:  Level of Assistance:   Assistive device utilized: Hurry cane Ramp Comments: would benefit from assessing  CURB:  Level of Assistance:   Assistive device utilized: [employer] Comments: would benefit from assessing  STAIRS: Level of Assistance: CGA and Min A Stair Negotiation Technique: Step to Pattern Forwards with Bilateral Rails Number of Stairs: 4  Height of Stairs: 6in  Comments: leading with R LE in both directions, attempts reciprocal pattern on ascent with sufficient strength, but having uncontrolled anterior lean during descent  GAIT: Gait pattern: step through pattern, decreased arm swing- Right, decreased arm swing- Left, decreased step length- Right, decreased step length- Left, decreased stride length, decreased hip/knee flexion- Right, decreased hip/knee flexion- Left, decreased ankle dorsiflexion- Right, decreased ankle dorsiflexion- Left, decreased trunk rotation, trunk flexed, poor foot clearance- Right, and poor foot clearance- Left Distance walked: 147ft Assistive device utilized: None Level of assistance: CGA Comments: significantly decreased  FUNCTIONAL TESTS:  5 times sit to stand: 38.53 seconds arms across chest Timed up and go (TUG): 19.46  seconds without AD 6 minute walk test: 10/04/2023: 543ft (11m at 0.24m/s avg) 10 meter walk test: 0.56 m/s without AD Mini-Best: 10/04/2023: 8/28  PATIENT SURVEYS:  ABC scale 17.5% with pt feeling <40% confident on all of the items, pt feels only 30% confident  walking around the house, 10% confident getting in/out of car, and 20% reaching for items  OCULOMOTOR EXAM from 11/16/2023 - performed due to Neurology note reporting concerns of decreased vertical gaze:  Ocular Alignment: normal  Ocular ROM: No Limitations  Spontaneous Nystagmus: absent  Gaze-Induced Nystagmus: absent  Smooth Pursuits: intact, good vertical movements with full range  Horizontal and Vertical Saccades: extra eye movements, slow, and frequently blinks, a few extra eye movements when moving to superior target                                                                                                                               TREATMENT DATE: 01/13/24   Physical Performance. 6 Min Walk Test:  Instructed patient to ambulate as quickly and as safely as possible for 6 minutes using LRAD. Patient was allowed to take standing rest breaks without stopping the test, but if the patient required a sitting rest break the clock would be stopped and the test would be over.  Results: 832 feet initially without AD, but after a few min required SPC due to fatigue/unsteadiness. Results indicate that the patient has reduced endurance with ambulation compared to age matched norms.  Age Matched Norms (in meters): 30-69 yo M: 47 F: 80, 69-79 yo M: 66 F: 471, 5-89 yo M: 417 F: 392 MDC: 58.21 meters (190.98 feet) or 50 meters (ANPTA Core Set of Outcome Measures for Adults with Neurologic Conditions, 2018) PT reviewed findings with pt  NMR: Postural intervention with emphasis on large-amplitude movement - PT provides explanation/VC/modeling of technique Seated trunk twist with cross-body UE reach and clap 6x each way   Session terminated early as pt had to leave due to transportation mix-up   PATIENT EDUCATION: Education details: exercise technique, reassessment  Person educated: Patient Education method: Explanation, demo, vc Education comprehension: verbalized understanding  and needs further education  HOME EXERCISE PROGRAM:  Access Code: [email or web address] URL: [email or web address] Date: 12/02/2023 Prepared by: Carlen Chasten  Exercises - Sidelying Thoracic Rotation with Open Book  - 1 x daily - 7 x weekly - 2 sets - 10 reps - Supine Shoulder External Rotation in Abduction  - 1 x daily - 7 x weekly - 2 sets - 10 reps - 10 seconds hold - Seated Scapular Retraction  - 1 x daily - 7 x weekly - 3 sets - 10 reps - 3 seconds hold - Sit to Stand with Arm Swing  - 1 x daily - 7 x weekly - 2 sets - 10 reps   GOALS: Goals reviewed with patient? Yes  SHORT TERM GOALS: Target date: 02/15/2024    Patient will be independent in home exercise  program to improve strength/mobility for better functional independence with ADLs. Baseline: need to initiate 10/07/2023: provided 12/02/2023: updated Goal status: IN PROGRESS   LONG TERM GOALS: Target date: 03/28/2024   1.  Patient (< 41 years old) will complete five times sit to stand test in < 12 seconds indicating an increased LE strength and improved balance. Baseline: 09/28/23: 38.53 seconds 11/09/2023: 22.13 seconds with arms across chest 01/04/2024: 20.12 seconds without UE support Goal status: IN PROGRESS   2. Patient will reduce timed up and go to <11 seconds to reduce fall risk and demonstrate improved transfer/gait ability. Baseline: 09/28/23: 19.46 seconds without AD 11/09/2023: 16.96 seconds, no AD, with close SBA for balance safety 01/04/2024: 15.845 seconds, no AD with close SBA for safety Goal status: IN PROGRESS  3.  Patient will increase 10 meter walk test to >1.36m/s as to improve gait speed for better community ambulation and to reduce fall risk. Baseline: 09/28/23: 0.90m/s without AD 11/09/2023: 0.89 m/s (11.50 seconds seconds 10.94 seconds) no AD, with close SBA for safety 01/04/2024: 0.935 m/s without AD, CGA/close SBA for safety Goal status: IN PROGRESS  4.  Patient will increase six minute walk test  distance to >1034ft for progression to community ambulator and improve gait ability Baseline: 10/04/2023: 597ft (144m at 0.13m/s avg) 11/09/2023: 1061 feet (323 meters, Avg speed 0.897 m/s) without AD Goal status: MET and Advanced 11/09/2023 Goal Updated: distance > 1340ft 01/13/2024: 832  ft mostly without SPC but then pt needed SPC due to fatigue, required one break (standing)   5. Patient will increase MiniBest Test score to >18/28 to indicate a reduced risk for falling and demonstrate increased independence with functional mobility and ADLs.  Baseline: 10/04/2023: 8/28  11/16/2023: 16/28  01/22/2024: deferred  Goal status: IN PROGRESS  6. Patient will increase ABC scale score >80% to demonstrate better functional mobility and better confidence with ADLs.   Baseline: 09/28/23: 17.5% 11/09/2023: 21.25% - pt reports he rated the test as is if he was not using his walker because this is his long term goal  01/04/2024: 35.625% - continuing to rate it as if he is not using his walker   Goal status: IN PROGRESS   ASSESSMENT:  CLINICAL IMPRESSION: Pt completed today with decreased performance compared to last visit. Pt did report to PT during test he woke up feeling like he was having a worse PD symptom day. PT provided encouragement and pt reported he was determined to improve. Session had to be terminated early as pt needed to leave due to transportation mix-up. MiniBest retesting deferred to future visit as a result. Patient's condition has the potential to improve in response to therapy. Maximum improvement is yet to be obtained. The anticipated improvement is attainable and reasonable in a generally predictable time.    OBJECTIVE IMPAIRMENTS: Abnormal gait, decreased activity tolerance, decreased balance, decreased coordination, decreased endurance, decreased knowledge of condition, decreased knowledge of use of DME, decreased mobility, difficulty walking, decreased ROM, decreased strength, decreased  safety awareness, improper body mechanics, postural dysfunction, and pain.   ACTIVITY LIMITATIONS: carrying, lifting, bending, standing, squatting, sleeping, stairs, transfers, bed mobility, continence, bathing, toileting, dressing, reach over head, hygiene/grooming, and locomotion level  PARTICIPATION LIMITATIONS: meal prep, cleaning, laundry, and community activity  PERSONAL FACTORS: Fitness and 3+ comorbidities: Vitamin D deficiency, Hyperlipidemia, and HTN are also affecting patient's functional outcome.   REHAB POTENTIAL: Good  CLINICAL DECISION MAKING: Evolving/moderate complexity  EVALUATION COMPLEXITY: Moderate  PLAN:  PT FREQUENCY: 1-2x/week  PT  DURATION: 12 weeks  PLANNED INTERVENTIONS: 97164- PT Re-evaluation, 97110-Therapeutic exercises, 97530- Therapeutic activity, [id number]- Neuromuscular re-education, [phone]- Self Care, 13086- Manual therapy, [phone]- Gait training, [phone]- Orthotic Fit/training, [phone]- Canalith repositioning, [phone]- Electrical stimulation (manual), Patient/Family education, Balance training, Stair training, Joint mobilization, Vestibular training, Visual/preceptual remediation/compensation, DME instructions, Cryotherapy, and Moist heat  PLAN FOR NEXT SESSION:  *PN at next visit* - complete 6 min walk test and MiniBest **focus on functional mobility tasks patient reports as challenging** - review updated HEP  - gait training with increased intensity - dynamic gait with focus on increased upright posture and speed of ambulation with arm swing (add AWs to UEs with boxing gloves)  - add dual-task challenges of head rotations, stepping over obstacles, sudden turns, and changes in gait speed - increased focus on turns - stepping in different directions   - with dual-task of Blaze Pods to promote increased speed/amplitude of movements  - continue arm reaching and trunk rotation (specifically to R) - standing balance: narrow BOS/SLS, on compliant surfaces, incline,  eyes open&eyes closed - stepping strategy re-training: focus on posterior stepping - large amplitude UE movements with promotion of improved upright posture - B LE strengthening (R>L)  - progress to floor transfers    Aminta Kales PT, DPT  Physical Therapist - [facility]  2:36 PM 01/13/24

## 2024-01-13 NOTE — Telephone Encounter (Signed)
 Pt. Did not arrive for his OT appointment this afternoon. Pt. Reported during a follow-up phone call that transportation was late picking him up today.

## 2024-01-18 ENCOUNTER — Ambulatory Visit

## 2024-01-18 ENCOUNTER — Ambulatory Visit: Admitting: Occupational Therapy

## 2024-01-18 DIAGNOSIS — R278 Other lack of coordination: Secondary | ICD-10-CM

## 2024-01-18 DIAGNOSIS — M6281 Muscle weakness (generalized): Secondary | ICD-10-CM | POA: Diagnosis not present

## 2024-01-18 DIAGNOSIS — R2681 Unsteadiness on feet: Secondary | ICD-10-CM

## 2024-01-18 DIAGNOSIS — R262 Difficulty in walking, not elsewhere classified: Secondary | ICD-10-CM

## 2024-01-18 NOTE — Therapy (Signed)
 Occupational Therapy Treatment Note   Patient Name: Daniel Daugherty MRN: [id number] DOB:[birth year], 58 y.o., male Today's Date: 01/18/2024  PCP: N/A REFERRING PROVIDER: Arlen Belton, MD  END OF SESSION:  OT End of Session - 01/18/24 1651     Visit Number 21    Number of Visits 48    Date for OT Re-Evaluation 03/28/24    OT Start Time 1445    OT Stop Time 1530    OT Time Calculation (min) 45 min    Activity Tolerance Patient tolerated treatment well    Behavior During Therapy WFL for tasks assessed/performed              Past Medical History:  Diagnosis Date   Hypertension    Past Surgical History:  Procedure Laterality Date   DG HAND LEFT COMPLETE ([facility] HX) Left 04/13/1999   There are no active problems to display for this patient.   ONSET DATE: 05/2021  REFERRING DIAG: Parkinson's Disease  THERAPY DIAG:  Muscle weakness (generalized)  Rationale for Evaluation and Treatment: Rehabilitation  SUBJECTIVE:   SUBJECTIVE STATEMENT:  Pt. reports  doing well today Pt accompanied by: self  PERTINENT HISTORY:   Pt. is a 58 y.o. male who was diagnosed with Parkinson's Disease in 2022. Pt. reports having had a progressive decline in function since having hernia surgery, and right shoulder rotator cuff surgery. PMHx included: HTN. Of note; Pt. was being followed by [facility], and receiving Home health therapy services. Pt. changed physicians, and was referred for outpatient rehab services here.  PRECAUTIONS: None  WEIGHT BEARING RESTRICTIONS: No  PAIN:  Are you having pain? 0/10  FALLS: Has patient fallen in last 6 months? No, 1 small slip near the bed tripped over shoes  LIVING ENVIRONMENT: Lives with: lives alone Lives in: [place],  Stairs: Entry level Has following equipment at home: Quad cane small base, rollator, shower chair, grab bars, hand rail at [employer]  PLOF: Independent  PATIENT GOALS: Improve self-care  OBJECTIVE:  Note: Objective  measures were completed at Evaluation unless otherwise noted.  HAND DOMINANCE: Right  ADLs:  Assist at home:  Ex wife, and children assist several times a week  Transfers/ambulation related to ADLs: Modified Independent Eating: Difficulty using dominant right hand. Drops food when scooping, Both hands for stabilizing drinks, and uses a straw. Unable to cut food.  Grooming: Increased time required for all grooming tasks. UB Dressing: Difficulty managing buttons LB Dressing: Less assist with pants with elastic, more assist with dress pants Toileting: Increased time for hygiene. Bathing: Requires increased time to complete Tub Shower transfers: Modified Independence with increased time  IADLs: Shopping: Son assists with grocery shopping Light housekeeping: Son's assist. Pt. Does light rising off of dishes. Meal Prep: Increased time. Independent with microwave, and air fryer. Difficulty opening bottles, containers Community mobility:  Transportation, light driving to grocery store Medication management:  Son's set-up weekly pillbox, Pt. Is responsible for taking the medications Financial management: Son's assist with monthly finances Handwriting: 50% legible Hobbies: Dancing, watching sports Career/work: Restaurant manager, fast food  MOBILITY STATUS: Uses a cane; shuffling gait  FUNCTIONAL OUTCOME MEASURES:  Mam-20 sum score: 53/80, MAM Measure score: 52.4   UPPER EXTREMITY ROM:    Active ROM Right eval Right 11/09/23 01/04/24 Left Eval [facility] overall  Shoulder flexion WFL 120(134) 147(829)   Shoulder abduction 52(100) 60(110) 56(213)   Shoulder adduction      Shoulder extension      Shoulder internal rotation  Shoulder external rotation      Elbow flexion [facility] WFL   Elbow extension [facility] WFL   Wrist flexion      Wrist extension [facility] WFL   Wrist ulnar deviation      Wrist radial deviation      Wrist pronation      Wrist supination      (Blank rows = not  tested)  UPPER EXTREMITY MMT:     MMT Right eval Right 11/09/23 Right 01/04/24 Left Eval 4-/5 overall Left 11/09/23 4/5 Overall  Shoulder flexion 3/5 3-/5 3-/5 3-/5   Shoulder abduction 2/5 2/5 2+/5 2+/5   Shoulder adduction       Shoulder extension       Shoulder internal rotation       Shoulder external rotation       Middle trapezius       Lower trapezius       Elbow flexion 3/5 3+/5 3+/5 3+/5   Elbow extension 3/5 3+5 4-/5 4-/5   Wrist flexion       Wrist extension 3/5 3/5 3/5 3/5   Wrist ulnar deviation       Wrist radial deviation       Wrist pronation       Wrist supination       (Blank rows = not tested)  HAND FUNCTION:   Eval: Grip strength: Right: 50 lbs; Left: 34 lbs, Lateral pinch: Right: 17 lbs, Left: 9 lbs, 3 point pinch: Right: 12 lbs, Left: 11 lbs  11/09/23: Grip strength: Right: 60 lbs; Left: 35 lbs, Lateral pinch: Right: 19 lbs, Left: 11 lbs, 3 point pinch: Right: 13 lbs, Left: 9 lbs  01/04/24:Grip strength: Right: 40 lbs; Left: 41 lbs, Lateral pinch: Right: 17 lbs, Left: 8 lbs, 3 point pinch: Right: 9 lbs, Left: 10 lbs  COORDINATION:  Eval: 9 Hole Peg test: Right: 1 min. & 46 sec; Left: 53 sec  11/09/2023: 9 Hole Peg test: Right: 1 min. & 22 sec; Left: 46 sec  01/04/24: 9 Hole Peg test: Right: 1 min. & 11 sec; Left: 46 sec   SENSATION:  Light touch: WFL Proprioception: WFL  EDEMA: N/A  COGNITION: Overall cognitive status: Within functional limits for tasks assessed  VISION:  Does not wear glasses.  VISION ASSESSMENT: To be further assessed in functional context  PERCEPTION: WFL  PRAXIS: Impaired: Motor planning                                                                                                                         TREATMENT DATE: 01/18/24  Therapeutic Ex:   -UE strengthening through reciprocal motion using the SciFit for 10 min. at resistance level 4.0 with constant monitoring of the BUEs. -Pectoral stretches were  performed in sitting. -Education was provided about home pectoral stretches in standing at the wall.     Manual Therapy:   -Soft tissue massage was performed to the scapular, and UE musculature 2/2  tightness -Pt. tolerated scapular mobilization for elevation, depression, and abduction/rotation in sitting to normalize tone, decrease tightness, and prepare the RUE ROM, strengthening, and functional use.   -Manual therapy was performed independent of, and in preparation for therapeutic Ex.    Self-care:  -Assessed spoon/utensil use using a large grip spoon, a right angled spoon, and a swivel spork for scooping with a sectional plate, and simulated hand-to face patterns    PATIENT EDUCATION: Education details: UE ROM, ADL functional status Person educated: Patient Education method: Explanation, Demonstration, Tactile cues, and Verbal cues Education comprehension: needs further education  HOME EXERCISE PROGRAM:  Continue to assess, and provide as indicated.   GOALS: Goals reviewed with patient? Yes  SHORT TERM GOALS: Target date: 02/15/2024      Pt. Will be independent with HEPs for RUE. Baseline: 01/04/24: Independent 11/09/2023: Independent Eval: No current HEP Goal status:Ongoing  LONG TERM GOALS: Target date: 03/28/2024  1.  Pt. Will increase right shoulder abduction by 10 degrees to assist with UE dressing Baseline: 01/04/24: 84(696) Pt. reports having donning his shirt is easier with his new routine 11/09/23: Right 60/(110) Eval: Right 52(100) Goal status: Ongoing  2.  Pt. Will increase BUE strength by 2 mm grades to assist with ADLs. Baseline: 01/04/24:  Right: shoulder flexion: 3-/5, abduction: 2/5, elbow flexion 4-/5, extension: 3+/5, wrist extension: 3/5. Left: 4/5 overallEval: Right: shoulder flexion: 3/5, abduction: 2/5, elbow flexion/extension: 3/5, wrist extension: 3/5. Left: 4-/5 overall4/08/25: Right: shoulder flexion: 3-/5, abduction: 2/5, elbow flexion/extension: 3+/5,  wrist extension: 3/5. Left: 4/5 overallEval: Right: shoulder flexion: 3/5, abduction: 2/5, elbow flexion/extension: 3/5, wrist extension: 3/5. Left: 4-/5 overall Goal status: Ongoing  3.  Pt. Will perform self-feeding with modified independence Baseline: 01/04/24: Pt. reports making improvements with self-feeding. Pt. has difficulty at times with bringing his spoon to his mouth, often bringing an empty spoon to his mouth 11/09/23: Pt. Continues to have difficulty using dominant right hand. Drops food when scooping, Both hands for stabilizing drinks, and uses a straw. Unable to cut food. Eval: Difficulty using dominant right hand. Drops food when scooping, Both hands for stabilizing drinks, and uses a straw. Unable to cut food. Goal status: Ongoing  4.  Pt. Will demonstrate independence with proper A/E techniques/compensatory strategies for ADL/IADLs.  Baseline: 01/04/24: Continue ongoing as needed. 11/09/23: Continue Eval: Education to be provided Goal status: Ongoing  5.  Pt. Will write a sentence with 75% legibility with modified independence Baseline: 01/04/24:  One sentence completed in 2 min. & 51 sec. With 50% legibility.11/09/2023: 50% legibility for one sentence. Eval: 50% legibility for one printed sentence Goal status: Ongoing   6.  Pt. Will improve bilateral [facility] skills by 3 sec. on the 9 Hole Peg Test to be able to manipulate small objects.  Baseline: 9 Hole Peg Test: R: 1 min. & 11 sec.  L: 46 sec. 11/09/2023: 9 Hole Peg test: Right: 1 min. & 22 sec; Left: 46 sec Eval: 9 Hole Peg test: Right: 1 min. & 46 sec; Left: 53 sec Goal status:New  7. Pt. Will improve the Mam-20 score by 5 points to reflect ADL/IADL improvement. Baseline: Mam-20 sum score: 53/80, Mam Measure score: 52.4 Goal status New   ASSESSMENT:  CLINICAL IMPRESSION:  Pt. reports feeling good today. Pt. tolerated the BUE strengthening well on the SciFit, as well as STM to the right scapular, and shoulder musculature with  decreased tightness following treatment. Pt. was able to efficiently use the right angled spoon with a  large grip consistently without spillage. Pt. continues to benefit from OT services to work on improving overall bilateral UE functioning in order to maximize engagement in, and efficiency in ADLs, and IADL tasks, and provide education about compensatory strategies.    PERFORMANCE DEFICITS: in functional skills including ADLs, IADLs, coordination, dexterity, proprioception, sensation, ROM, strength, pain, Fine motor control, Gross motor control, and UE functional use, cognitive skills including , and psychosocial skills including coping strategies, environmental adaptation, and routines and behaviors.   IMPAIRMENTS: are limiting patient from ADLs, IADLs, and leisure.   CO-MORBIDITIES: may have co-morbidities  that affects occupational performance. Patient will benefit from skilled OT to address above impairments and improve overall function.  MODIFICATION OR ASSISTANCE TO COMPLETE EVALUATION: Min-Moderate modification of tasks or assist with assess necessary to complete an evaluation.  OT OCCUPATIONAL PROFILE AND HISTORY: Detailed assessment: Review of records and additional review of physical, cognitive, psychosocial history related to current functional performance.  CLINICAL DECISION MAKING: Moderate - several treatment options, min-mod task modification necessary  REHAB POTENTIAL: Good  EVALUATION COMPLEXITY: Moderate    PLAN:  OT FREQUENCY: 2x/week  OT DURATION: 12 weeks  PLANNED INTERVENTIONS: 97168 OT Re-evaluation, 97535 self care/ADL training, 13086 therapeutic exercise, 97530 therapeutic activity, 97112 neuromuscular re-education, 97140 manual therapy, 97018 paraffin, 57846 moist heat, 97034 contrast bath, 97760 Orthotics management and training, 96295 Splinting (initial encounter), energy conservation, patient/family education, and DME and/or AE instructions  RECOMMENDED OTHER  SERVICES:   PT  CONSULTED AND AGREED WITH PLAN OF CARE: Patient  PLAN FOR NEXT SESSION:   Treatment  Leonetta Mcgivern, MS, OTR/L   01/18/2024, 4:54 PM

## 2024-01-18 NOTE — Therapy (Signed)
 OUTPATIENT PHYSICAL THERAPY NEURO TREATMENT     Patient Name: Daniel Daugherty MRN: [id number] DOB:Dec 05, 1965, 58 y.o., male Today's Date: 01/18/2024   PCP: Tawnya Fava, Waddell Guess, MD  REFERRING PROVIDER: Tawnya Fava, Waddell Guess, MD   END OF SESSION:    PT End of Session - 01/18/24 1346     Visit Number 21    Number of Visits 24    Date for PT Re-Evaluation 03/28/24    Authorization Type Medicare 2025    PT Start Time 1358    PT Stop Time 1442    PT Time Calculation (min) 44 min    Equipment Utilized During Treatment Gait belt    Activity Tolerance Patient tolerated treatment well    Behavior During Therapy WFL for tasks assessed/performed            Past Medical History:  Diagnosis Date   Hypertension    Past Surgical History:  Procedure Laterality Date   DG HAND LEFT COMPLETE ([facility] HX) Left 04/13/1999   There are no active problems to display for this patient.   ONSET DATE: 2022 is when he noticed the change and that was around the same time he was diagnosed with Parkinson's  REFERRING DIAG:  G20.A1 (ICD-10-CM) - Parkinson disease (HCC)  Z74.1 (ICD-10-CM) - Requires daily assistance for activities of daily living (ADL) and comfort needs   THERAPY DIAG:  Other lack of coordination  Muscle weakness (generalized)  Difficulty in walking, not elsewhere classified  Unsteadiness on feet  Rationale for Evaluation and Treatment: Rehabilitation  SUBJECTIVE:                                                                                                                                                                                             SUBJECTIVE STATEMENT: TODAY 01/18/24: Pt reports some neck soreness at the moment (not new). He reports no stumbles/falls. His shoulder is doing OK.     PREVIOUSLY-  Pt reports he has been doing alright.  States his son turned 34y.o. this past Sunday and they had a cookout to celebrate. Pt reports he has gotten lazy  since last session, not performing his HEP, and has been kicking back. However, later states he has been encouraging himself to stay active and walk every day using his walker as needed  Pt reports he still hasn't called Neurology office to set-up an appointment. Pt reports he stopped coming to therapy because he felt like he wasn't getting any better and it was discouraging. Reports he doesn't feel safe trying to do the things at home that he does at therapy. Therapist educated pt  that he is only to do the prescribed HEP at home and that the focus of therapy sessions is to challenge him to perform tasks that he is not safe to perform without a skilled physical therapist in order to advance his mobility.  Pt states he is ready to get better and he is frustrated with his CLOF. Pt reports he has looked up some things on social media for support and to find ideas on how to move more safely and independently. Pt sates the idea of the [employer] group is intimidating to him and he is concerned he will get frustrated by his inability to do as well as other people in the group.   Pt sates he is trying to get dressed faster, but when he times himself he hasn't increased his speed. Pt states when he gets flustered and is trying to move fast, then his tremor increase and he has to stop and relax to get them to calm back down.  Pt states he knows he needs to keep coming to therapy, but he isn't improving as fast as he wants to. Pt states he doesn't want to keep missing out on life events because of his impairments. But he can't tell how the improvements he is making in therapy are improving his every day life. Despite this, pt in agreement to complete re-certification at this time to allow him to continue with therapy because he wants to get better. Therapist educated pt on goal to focus more on specific functional mobility tasks he is having difficulty with at home.  Denies stumbles/falls since last  session.     Pt accompanied by: self  PERTINENT HISTORY: Parkinsonism, Vitamin D deficiency, Hyperlipidemia, HTN, poor medication compliance (per chart review)  PAIN:  Are you having pain? Yes: NPRS scale: 5/10, but states it ain't that bad Pain location: R shoulder and low back Pain description: throbbing in shoulder Aggravating factors: overhead movements or reaching back Relieving factors: rest and at home uses a massager  PRECAUTIONS: Fall  RED FLAGS: None   WEIGHT BEARING RESTRICTIONS: No  FALLS: Has patient fallen in last 6 months? Yes. 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   PATIENT GOALS: Get my balance right with my walking, help get my R arm stronger and be able to move it more, improve strength in my legs (specifically the Right)  OBJECTIVE:  Note: Objective measures were completed at Evaluation unless otherwise noted.  DIAGNOSTIC FINDINGS: N/A  COGNITION: Overall cognitive status: Within functional limits for tasks assessed; however, did not assess higher level cognitive tasks   SENSATION: WFL Light touch on screen Pt reports R foot falls asleep sitting on toilet and he has difficult time getting feeling back in it before feeling safe standing up.  COORDINATION: Overall bradykinesia with decreased speed and amplitude of movements  EDEMA:  None present  MUSCLE TONE: Not  formally assessed  MUSCLE LENGTH: Not formally assessed, but may have some hamstring tightness  on R associated with quad weakness and inability to achieve full knee extension in sitting  DTRs:  Not assessed  POSTURE: rounded shoulders, forward head, increased thoracic kyphosis, posterior pelvic tilt, and flexed trunk    LOWER EXTREMITY ROM:     Active ROM Right Eval Left Eval  Hip flexion Decreased in sitting compared to L   Hip extension    Hip abduction    Hip adduction    Hip internal rotation    Hip external rotation    Knee flexion    Knee extension Lacks full knee extension in sitting   Ankle dorsiflexion Limited in sitting Limited in sitting  Ankle plantarflexion    Ankle inversion    Ankle eversion     (Blank rows = not tested)  LOWER EXTREMITY MMT:    MMT Right Eval Left Eval  Hip flexion 3- 4+  Hip extension    Hip abduction    Hip adduction    Hip internal rotation    Hip external rotation    Knee flexion 3 4+  Knee extension 3- 4+  Ankle dorsiflexion 3- 4+  Ankle plantarflexion 3- 4+  Ankle inversion    Ankle eversion    (Blank rows = not tested)  Manual Muscle Test Scale 0/5 = No muscle contraction can be seen or felt 1/5 = Contraction can be felt, but there is no motion 2-/5 = Part moves through incomplete ROM w/ gravity decreased 2/5 = Part moves through complete ROM w/ gravity decreased 2+/5 = Part moves through incomplete ROM (<50%) against gravity or through complete ROM w/ gravity 3-/5 = Part moves through incomplete ROM (>50%) against gravity 3/5 = Part moves through complete ROM against gravity 3+/5 = Part moves through complete ROM against gravity/slight resistance 4-/5= Holds test position against slight to moderate pressure 4/5 = Part moves through complete ROM against gravity/moderate resistance 4+/5= Holds test position against moderate to strong pressure 5/5 = Part moves through complete ROM against gravity/full resistance  BED  MOBILITY:  Sit to supine   Supine to sit   Need to assess  ADL: Reports dificulty getitng on R shoe, specifically having trouble pushing his foot down in his shoe  TRANSFERS: Assistive device utilized: Hurry cane  Sit to stand: Modified independence and SBA Stand to sit: Modified independence and SBA Chair to chair: Modified independence and SBA Floor: would benefit from testing in future  RAMP:  Level of Assistance:   Assistive device utilized: Hurry cane Ramp Comments: would benefit from assessing  CURB:  Level of Assistance:   Assistive device utilized: [employer] Comments: would benefit from assessing  STAIRS: Level of Assistance: CGA and Min A Stair Negotiation Technique: Step to Pattern Forwards with Bilateral Rails Number of Stairs: 4  Height of Stairs: 6in  Comments: leading with R LE in both directions, attempts reciprocal pattern on ascent with sufficient strength, but having uncontrolled anterior lean during descent  GAIT: Gait pattern: step through pattern, decreased arm swing- Right, decreased arm swing- Left, decreased step length- Right, decreased step length- Left, decreased stride length, decreased hip/knee flexion- Right, decreased hip/knee flexion- Left, decreased ankle dorsiflexion- Right, decreased ankle dorsiflexion- Left, decreased trunk rotation, trunk flexed, poor foot clearance- Right, and poor foot clearance- Left Distance walked: 180ft Assistive device utilized: None Level of assistance: CGA Comments: significantly decreased  FUNCTIONAL TESTS:  5 times sit to stand: 38.53 seconds arms across chest Timed up and go (TUG): 19.46 seconds without AD 6 minute walk  test: 10/04/2023: 515ft (155m at 0.110m/s avg) 10 meter walk test: 0.56 m/s without AD Mini-Best: 10/04/2023: 8/28  PATIENT SURVEYS:  ABC scale 17.5% with pt feeling <40% confident on all of the items, pt feels only 30% confident walking around the house, 10% confident getting in/out of  car, and 20% reaching for items  OCULOMOTOR EXAM from 11/16/2023 - performed due to Neurology note reporting concerns of decreased vertical gaze:  Ocular Alignment: normal  Ocular ROM: No Limitations  Spontaneous Nystagmus: absent  Gaze-Induced Nystagmus: absent  Smooth Pursuits: intact, good vertical movements with full range  Horizontal and Vertical Saccades: extra eye movements, slow, and frequently blinks, a few extra eye movements when moving to superior target                                                                                                                               TREATMENT DATE: 01/18/24  Physical Performance:   [facility] Main PT Assessment - 01/18/24 0001       Mini-BESTest   Sit To Stand Normal: Comes to stand without use of hands and stabilizes independently.    Rise to Toes Normal: Stable for 3 s with maximum height.    Stand on one leg (left) Normal: 20 s.    Stand on one leg (right) Moderate: < 20 s    Stand on one leg - lowest score 1    Compensatory Stepping Correction - Forward Moderate: More than one step is required to recover equilibrium    Compensatory Stepping Correction - Backward Moderate: More than one step is required to recover equilibrium    Compensatory Stepping Correction - Left Lateral Moderate: Several steps to recover equilibrium    Compensatory Stepping Correction - Right Lateral Moderate: Several steps to recover equilibrium    Stepping Corredtion Lateral - lowest score 1    Stance - Feet together, eyes open, firm surface  Normal: 30s    Stance - Feet together, eyes closed, foam surface  Normal: 30s    Incline - Eyes Closed Moderate: Stands independently < 30s OR aligns with surface    Change in Gait Speed Normal: Significantly changes walkling speed without imbalance    Walk with head turns - Horizontal Normal: performs head turns with no change in gait speed and good balance    Walk with pivot turns Moderate:Turns with feet close SLOW (>4  steps) with good balance.    Step over obstacles Normal: Able to step over box with minimal change of gait speed and with good balance.    Timed UP & GO with Dual Task Severe: Stops counting while walking OR stops walking while counting.    Mini-BEST total score 20          NMR Large amplitude movement & postural training- Seated FWD reach to BUE abduction and seated upright posture 2x10 - somewhat fatiguing in shoulder, completes slowly, able to correct some for speed with cues  PATIENT EDUCATION: Education details: exercise technique, reassessment  Person educated: Patient Education method: Explanation, demo, vc Education comprehension: verbalized understanding and needs further education  HOME EXERCISE PROGRAM:  Access Code: [email or web address] URL: [email or web address] Date: 12/02/2023 Prepared by: Carlen Chasten  Exercises - Sidelying Thoracic Rotation with Open Book  - 1 x daily - 7 x weekly - 2 sets - 10 reps - Supine Shoulder External Rotation in Abduction  - 1 x daily - 7 x weekly - 2 sets - 10 reps - 10 seconds hold - Seated Scapular Retraction  - 1 x daily - 7 x weekly - 3 sets - 10 reps - 3 seconds hold - Sit to Stand with Arm Swing  - 1 x daily - 7 x weekly - 2 sets - 10 reps   GOALS: Goals reviewed with patient? Yes  SHORT TERM GOALS: Target date: 02/15/2024    Patient will be independent in home exercise program to improve strength/mobility for better functional independence with ADLs. Baseline: need to initiate 10/07/2023: provided 12/02/2023: updated Goal status: IN PROGRESS   LONG TERM GOALS: Target date: 03/28/2024   1.  Patient (< 54 years old) will complete five times sit to stand test in < 12 seconds indicating an increased LE strength and improved balance. Baseline: 09/28/23: 38.53 seconds 11/09/2023: 22.13 seconds with arms across chest 01/04/2024: 20.12 seconds without UE support Goal status: IN PROGRESS   2. Patient will reduce  timed up and go to <11 seconds to reduce fall risk and demonstrate improved transfer/gait ability. Baseline: 09/28/23: 19.46 seconds without AD 11/09/2023: 16.96 seconds, no AD, with close SBA for balance safety 01/04/2024: 15.845 seconds, no AD with close SBA for safety Goal status: IN PROGRESS  3.  Patient will increase 10 meter walk test to >1.40m/s as to improve gait speed for better community ambulation and to reduce fall risk. Baseline: 09/28/23: 0.63m/s without AD 11/09/2023: 0.89 m/s (11.50 seconds seconds 10.94 seconds) no AD, with close SBA for safety 01/04/2024: 0.935 m/s without AD, CGA/close SBA for safety Goal status: IN PROGRESS  4.  Patient will increase six minute walk test distance to >1077ft for progression to community ambulator and improve gait ability Baseline: 10/04/2023: 577ft (144m at 0.20m/s avg) 11/09/2023: 1061 feet (323 meters, Avg speed 0.897 m/s) without AD Goal status: MET and Advanced 11/09/2023 Goal Updated: distance > 1317ft 01/13/2024: 832  ft mostly without SPC but then pt needed SPC due to fatigue, required one break (standing)   5. Patient will increase MiniBest Test score to >18/28 to indicate a reduced risk for falling and demonstrate increased independence with functional mobility and ADLs.  Baseline: 10/04/2023: 8/28  11/16/2023: 16/28  01/22/2024: deferred 6/17: 20/28  Goal status: MET  6. Patient will increase ABC scale score >80% to demonstrate better functional mobility and better confidence with ADLs.   Baseline: 09/28/23: 17.5% 11/09/2023: 21.25% - pt reports he rated the test as is if he was not using his walker because this is his long term goal  01/04/2024: 35.625% - continuing to rate it as if he is not using his walker   Goal status: IN PROGRESS   ASSESSMENT:  CLINICAL IMPRESSION: MiniBest Test completed and pt has now achieved goal, scoring 20/28. Pt notably still had difficulty with reactive postural control tasks, TUG with dual cog task, and standing  on incline with EC. Overall, pt has made gains with balance AEB test result. The pt will continue to benefit from further skilled PT to improve gait,  balance, strength and mobility and to decrease fall risk and increase QOL.  OBJECTIVE IMPAIRMENTS: Abnormal gait, decreased activity tolerance, decreased balance, decreased coordination, decreased endurance, decreased knowledge of condition, decreased knowledge of use of DME, decreased mobility, difficulty walking, decreased ROM, decreased strength, decreased safety awareness, improper body mechanics, postural dysfunction, and pain.   ACTIVITY LIMITATIONS: carrying, lifting, bending, standing, squatting, sleeping, stairs, transfers, bed mobility, continence, bathing, toileting, dressing, reach over head, hygiene/grooming, and locomotion level  PARTICIPATION LIMITATIONS: meal prep, cleaning, laundry, and community activity  PERSONAL FACTORS: Fitness and 3+ comorbidities: Vitamin D deficiency, Hyperlipidemia, and HTN are also affecting patient's functional outcome.   REHAB POTENTIAL: Good  CLINICAL DECISION MAKING: Evolving/moderate complexity  EVALUATION COMPLEXITY: Moderate  PLAN:  PT FREQUENCY: 1-2x/week  PT DURATION: 12 weeks  PLANNED INTERVENTIONS: 97164- PT Re-evaluation, 97110-Therapeutic exercises, 97530- Therapeutic activity, 97112- Neuromuscular re-education, 97535- Self Care, 57846- Manual therapy, [phone]- Gait training, [phone]- Orthotic Fit/training, [phone]- Canalith repositioning, [phone]- Electrical stimulation (manual), Patient/Family education, Balance training, Stair training, Joint mobilization, Vestibular training, Visual/preceptual remediation/compensation, DME instructions, Cryotherapy, and Moist heat  PLAN FOR NEXT SESSION:  *PN at next visit* - complete 6 min walk test and MiniBest **focus on functional mobility tasks patient reports as challenging** - review updated HEP  - gait training with increased intensity - dynamic  gait with focus on increased upright posture and speed of ambulation with arm swing (add AWs to UEs with boxing gloves)  - add dual-task challenges of head rotations, stepping over obstacles, sudden turns, and changes in gait speed - increased focus on turns - stepping in different directions   - with dual-task of Blaze Pods to promote increased speed/amplitude of movements  - continue arm reaching and trunk rotation (specifically to R) - standing balance: narrow BOS/SLS, on compliant surfaces, incline, eyes open&eyes closed - stepping strategy re-training: focus on posterior stepping - large amplitude UE movements with promotion of improved upright posture - B LE strengthening (R>L)  - progress to floor transfers    Aminta Kales PT, DPT  Physical Therapist - [facility]  4:23 PM 01/18/24

## 2024-01-19 NOTE — Therapy (Incomplete)
 Occupational Therapy Treatment Note   Patient Name: Daniel Daugherty MRN: [id number] DOB:March 23, 1966, 58 y.o., male Today's Date: 01/19/2024  PCP: N/A REFERRING PROVIDER: Arlen Belton, MD  END OF SESSION:        Past Medical History:  Diagnosis Date   Hypertension    Past Surgical History:  Procedure Laterality Date   DG HAND LEFT COMPLETE ([facility] HX) Left 04/13/1999   There are no active problems to display for this patient.   ONSET DATE: 05/2021  REFERRING DIAG: Parkinson's Disease  THERAPY DIAG:  No diagnosis found.  Rationale for Evaluation and Treatment: Rehabilitation  SUBJECTIVE:   SUBJECTIVE STATEMENT:  Pt. Reports *** Pt accompanied by: self  PERTINENT HISTORY:   Pt. is a 58 y.o. male who was diagnosed with Parkinson's Disease in 2022. Pt. reports having had a progressive decline in function since having hernia surgery, and right shoulder rotator cuff surgery. PMHx included: HTN. Of note; Pt. was being followed by [facility], and receiving Home health therapy services. Pt. changed physicians, and was referred for outpatient rehab services here.  PRECAUTIONS: None  WEIGHT BEARING RESTRICTIONS: No  PAIN:  Are you having pain? 0/10  FALLS: Has patient fallen in last 6 months? No, 1 small slip near the bed tripped over shoes  LIVING ENVIRONMENT: Lives with: lives alone Lives in: [place],  Stairs: Entry level Has following equipment at home: Quad cane small base, rollator, shower chair, grab bars, hand rail at [employer]  PLOF: Independent  PATIENT GOALS: Improve self-care  OBJECTIVE:  Note: Objective measures were completed at Evaluation unless otherwise noted.  HAND DOMINANCE: Right  ADLs:  Assist at home:  Ex wife, and children assist several times a week  Transfers/ambulation related to ADLs: Modified Independent Eating: Difficulty using dominant right hand. Drops food when scooping, Both hands for stabilizing drinks, and uses a straw.  Unable to cut food.  Grooming: Increased time required for all grooming tasks. UB Dressing: Difficulty managing buttons LB Dressing: Less assist with pants with elastic, more assist with dress pants Toileting: Increased time for hygiene. Bathing: Requires increased time to complete Tub Shower transfers: Modified Independence with increased time  IADLs: Shopping: Son assists with grocery shopping Light housekeeping: Son's assist. Pt. Does light rising off of dishes. Meal Prep: Increased time. Independent with microwave, and air fryer. Difficulty opening bottles, containers Community mobility:  Transportation, light driving to grocery store Medication management:  Son's set-up weekly pillbox, Pt. Is responsible for taking the medications Financial management: Son's assist with monthly finances Handwriting: 50% legible Hobbies: Dancing, watching sports Career/work: Restaurant manager, fast food  MOBILITY STATUS: Uses a cane; shuffling gait  FUNCTIONAL OUTCOME MEASURES:  Mam-20 sum score: 53/80, MAM Measure score: 52.4   UPPER EXTREMITY ROM:    Active ROM Right eval Right 11/09/23 01/04/24 Left Eval [facility] overall  Shoulder flexion WFL 120(134) 409(811)   Shoulder abduction 52(100) 60(110) 68(118)   Shoulder adduction      Shoulder extension      Shoulder internal rotation      Shoulder external rotation      Elbow flexion [facility] WFL   Elbow extension [facility] WFL   Wrist flexion      Wrist extension [facility] WFL   Wrist ulnar deviation      Wrist radial deviation      Wrist pronation      Wrist supination      (Blank rows = not tested)  UPPER EXTREMITY MMT:  MMT Right eval Right 11/09/23 Right 01/04/24 Left Eval 4-/5 overall Left 11/09/23 4/5 Overall  Shoulder flexion 3/5 3-/5 3-/5 3-/5   Shoulder abduction 2/5 2/5 2+/5 2+/5   Shoulder adduction       Shoulder extension       Shoulder internal rotation       Shoulder external rotation       Middle trapezius        Lower trapezius       Elbow flexion 3/5 3+/5 3+/5 3+/5   Elbow extension 3/5 3+5 4-/5 4-/5   Wrist flexion       Wrist extension 3/5 3/5 3/5 3/5   Wrist ulnar deviation       Wrist radial deviation       Wrist pronation       Wrist supination       (Blank rows = not tested)  HAND FUNCTION:   Eval: Grip strength: Right: 50 lbs; Left: 34 lbs, Lateral pinch: Right: 17 lbs, Left: 9 lbs, 3 point pinch: Right: 12 lbs, Left: 11 lbs  11/09/23: Grip strength: Right: 60 lbs; Left: 35 lbs, Lateral pinch: Right: 19 lbs, Left: 11 lbs, 3 point pinch: Right: 13 lbs, Left: 9 lbs  01/04/24:Grip strength: Right: 40 lbs; Left: 41 lbs, Lateral pinch: Right: 17 lbs, Left: 8 lbs, 3 point pinch: Right: 9 lbs, Left: 10 lbs  COORDINATION:  Eval: 9 Hole Peg test: Right: 1 min. & 46 sec; Left: 53 sec  11/09/2023: 9 Hole Peg test: Right: 1 min. & 22 sec; Left: 46 sec  01/04/24: 9 Hole Peg test: Right: 1 min. & 11 sec; Left: 46 sec   SENSATION:  Light touch: WFL Proprioception: WFL  EDEMA: N/A  COGNITION: Overall cognitive status: Within functional limits for tasks assessed  VISION:  Does not wear glasses.  VISION ASSESSMENT: To be further assessed in functional context  PERCEPTION: WFL  PRAXIS: Impaired: Motor planning                                                                                                                         TREATMENT DATE: 01/20/24  Therapeutic Ex:   -UE strengthening through reciprocal motion using the SciFit for 10 min. at resistance level 4.0 with constant monitoring of the BUEs. -Pectoral stretches were performed in sitting. -Education was provided about home pectoral stretches in standing at the wall.     Manual Therapy:   -Soft tissue massage was performed to the scapular, and UE musculature 2/2 tightness -Pt. tolerated scapular mobilization for elevation, depression, and abduction/rotation in sitting to normalize tone, decrease tightness, and prepare the  RUE ROM, strengthening, and functional use.   -Manual therapy was performed independent of, and in preparation for therapeutic Ex.    Self-care:  -Assessed spoon/utensil use using a large grip spoon, a right angled spoon, and a swivel spork for scooping with a sectional plate, and simulated hand-to face patterns    PATIENT EDUCATION: Education details:  UE ROM, ADL functional status Person educated: Patient Education method: Explanation, Demonstration, Tactile cues, and Verbal cues Education comprehension: needs further education  HOME EXERCISE PROGRAM:  Continue to assess, and provide as indicated.   GOALS: Goals reviewed with patient? Yes  SHORT TERM GOALS: Target date: 02/15/2024      Pt. Will be independent with HEPs for RUE. Baseline: 01/04/24: Independent 11/09/2023: Independent Eval: No current HEP Goal status:Ongoing  LONG TERM GOALS: Target date: 03/28/2024  1.  Pt. Will increase right shoulder abduction by 10 degrees to assist with UE dressing Baseline: 01/04/24: 16(109) Pt. reports having donning his shirt is easier with his new routine 11/09/23: Right 60/(110) Eval: Right 52(100) Goal status: Ongoing  2.  Pt. Will increase BUE strength by 2 mm grades to assist with ADLs. Baseline: 01/04/24:  Right: shoulder flexion: 3-/5, abduction: 2/5, elbow flexion 4-/5, extension: 3+/5, wrist extension: 3/5. Left: 4/5 overallEval: Right: shoulder flexion: 3/5, abduction: 2/5, elbow flexion/extension: 3/5, wrist extension: 3/5. Left: 4-/5 overall4/08/25: Right: shoulder flexion: 3-/5, abduction: 2/5, elbow flexion/extension: 3+/5, wrist extension: 3/5. Left: 4/5 overallEval: Right: shoulder flexion: 3/5, abduction: 2/5, elbow flexion/extension: 3/5, wrist extension: 3/5. Left: 4-/5 overall Goal status: Ongoing  3.  Pt. Will perform self-feeding with modified independence Baseline: 01/04/24: Pt. reports making improvements with self-feeding. Pt. has difficulty at times with bringing his  spoon to his mouth, often bringing an empty spoon to his mouth 11/09/23: Pt. Continues to have difficulty using dominant right hand. Drops food when scooping, Both hands for stabilizing drinks, and uses a straw. Unable to cut food. Eval: Difficulty using dominant right hand. Drops food when scooping, Both hands for stabilizing drinks, and uses a straw. Unable to cut food. Goal status: Ongoing  4.  Pt. Will demonstrate independence with proper A/E techniques/compensatory strategies for ADL/IADLs.  Baseline: 01/04/24: Continue ongoing as needed. 11/09/23: Continue Eval: Education to be provided Goal status: Ongoing  5.  Pt. Will write a sentence with 75% legibility with modified independence Baseline: 01/04/24:  One sentence completed in 2 min. & 51 sec. With 50% legibility.11/09/2023: 50% legibility for one sentence. Eval: 50% legibility for one printed sentence Goal status: Ongoing   6.  Pt. Will improve bilateral [facility] skills by 3 sec. on the 9 Hole Peg Test to be able to manipulate small objects.  Baseline: 9 Hole Peg Test: R: 1 min. & 11 sec.  L: 46 sec. 11/09/2023: 9 Hole Peg test: Right: 1 min. & 22 sec; Left: 46 sec Eval: 9 Hole Peg test: Right: 1 min. & 46 sec; Left: 53 sec Goal status:New  7. Pt. Will improve the Mam-20 score by 5 points to reflect ADL/IADL improvement. Baseline: Mam-20 sum score: 53/80, Mam Measure score: 52.4 Goal status New   ASSESSMENT:  CLINICAL IMPRESSION:  ***Pt. reports feeling good today. Pt. tolerated the BUE strengthening well on the SciFit, as well as STM to the right scapular, and shoulder musculature with decreased tightness following treatment. Pt. was able to efficiently use the right angled spoon with a large grip consistently without spillage. Pt. continues to benefit from OT services to work on improving overall bilateral UE functioning in order to maximize engagement in, and efficiency in ADLs, and IADL tasks, and provide education about compensatory  strategies.    PERFORMANCE DEFICITS: in functional skills including ADLs, IADLs, coordination, dexterity, proprioception, sensation, ROM, strength, pain, Fine motor control, Gross motor control, and UE functional use, cognitive skills including , and psychosocial skills including coping strategies, environmental adaptation,  and routines and behaviors.   IMPAIRMENTS: are limiting patient from ADLs, IADLs, and leisure.   CO-MORBIDITIES: may have co-morbidities  that affects occupational performance. Patient will benefit from skilled OT to address above impairments and improve overall function.  MODIFICATION OR ASSISTANCE TO COMPLETE EVALUATION: Min-Moderate modification of tasks or assist with assess necessary to complete an evaluation.  OT OCCUPATIONAL PROFILE AND HISTORY: Detailed assessment: Review of records and additional review of physical, cognitive, psychosocial history related to current functional performance.  CLINICAL DECISION MAKING: Moderate - several treatment options, min-mod task modification necessary  REHAB POTENTIAL: Good  EVALUATION COMPLEXITY: Moderate    PLAN:  OT FREQUENCY: 2x/week  OT DURATION: 12 weeks  PLANNED INTERVENTIONS: 97168 OT Re-evaluation, 97535 self care/ADL training, 16109 therapeutic exercise, 97530 therapeutic activity, 97112 neuromuscular re-education, 97140 manual therapy, 97018 paraffin, 60454 moist heat, 97034 contrast bath, 97760 Orthotics management and training, 09811 Splinting (initial encounter), energy conservation, patient/family education, and DME and/or AE instructions  RECOMMENDED OTHER SERVICES:   PT  CONSULTED AND AGREED WITH PLAN OF CARE: Patient  PLAN FOR NEXT SESSION:   Treatment  Gordan Latina, M.S. OTR/L  01/19/24, 12:52 PM  ascom [phone]  01/19/2024, 12:52 PM

## 2024-01-20 ENCOUNTER — Ambulatory Visit: Admitting: Occupational Therapy

## 2024-01-20 ENCOUNTER — Ambulatory Visit

## 2024-01-25 ENCOUNTER — Ambulatory Visit: Admitting: Physical Therapy

## 2024-01-25 ENCOUNTER — Ambulatory Visit: Admitting: Occupational Therapy

## 2024-01-25 DIAGNOSIS — R262 Difficulty in walking, not elsewhere classified: Secondary | ICD-10-CM

## 2024-01-25 DIAGNOSIS — M6281 Muscle weakness (generalized): Secondary | ICD-10-CM

## 2024-01-25 DIAGNOSIS — R2681 Unsteadiness on feet: Secondary | ICD-10-CM

## 2024-01-25 DIAGNOSIS — R269 Unspecified abnormalities of gait and mobility: Secondary | ICD-10-CM

## 2024-01-25 DIAGNOSIS — R278 Other lack of coordination: Secondary | ICD-10-CM

## 2024-01-25 DIAGNOSIS — R531 Weakness: Secondary | ICD-10-CM

## 2024-01-25 NOTE — Therapy (Signed)
 OUTPATIENT PHYSICAL THERAPY NEURO TREATMENT     Patient Name: Daniel Daugherty MRN: [id number] DOB:03/30/1966, 58 y.o., male Today's Date: 01/26/2024   PCP: Odell Chard, Edra GRADE, MD  REFERRING PROVIDER: Odell Chard, Edra GRADE, MD   END OF SESSION:    PT End of Session - 01/25/24 1435     Visit Number 22    Number of Visits 24    Date for PT Re-Evaluation 03/28/24    Authorization Type Medicare 2025    Progress Note Due on Visit 10    PT Start Time 1445    PT Stop Time 1528    PT Time Calculation (min) 43 min    Equipment Utilized During Treatment Gait belt    Activity Tolerance Patient tolerated treatment well    Behavior During Therapy WFL for tasks assessed/performed            Past Medical History:  Diagnosis Date   Hypertension    Past Surgical History:  Procedure Laterality Date   DG HAND LEFT COMPLETE ([facility] HX) Left 04/13/1999   There are no active problems to display for this patient.   ONSET DATE: 2022 is when he noticed the change and that was around the same time he was diagnosed with Parkinson's  REFERRING DIAG:  G20.A1 (ICD-10-CM) - Parkinson disease (HCC)  Z74.1 (ICD-10-CM) - Requires daily assistance for activities of daily living (ADL) and comfort needs   THERAPY DIAG:  Muscle weakness (generalized)  Difficulty in walking, not elsewhere classified  Unsteadiness on feet  Abnormality of gait  Generalized weakness  Rationale for Evaluation and Treatment: Rehabilitation  SUBJECTIVE:                                                                                                                                                                                             SUBJECTIVE STATEMENT: TODAY 01/26/24: Pt reports no falls or LOB since last session. Had a good weekend and got to watch his grand kids play some basketball.     PREVIOUSLY-  Pt reports he has been doing alright.  States his son turned 34y.o. this past Sunday and they  had a cookout to celebrate. Pt reports he has gotten lazy since last session, not performing his HEP, and has been kicking back. However, later states he has been encouraging himself to stay active and walk every day using his walker as needed  Pt reports he still hasn't called Neurology office to set-up an appointment. Pt reports he stopped coming to therapy because he felt like he wasn't getting any better and it was discouraging. Reports he doesn't feel safe  trying to do the things at home that he does at therapy. Therapist educated pt that he is only to do the prescribed HEP at home and that the focus of therapy sessions is to challenge him to perform tasks that he is not safe to perform without a skilled physical therapist in order to advance his mobility.  Pt states he is ready to get better and he is frustrated with his CLOF. Pt reports he has looked up some things on social media for support and to find ideas on how to move more safely and independently. Pt sates the idea of the [employer] group is intimidating to him and he is concerned he will get frustrated by his inability to do as well as other people in the group.   Pt sates he is trying to get dressed faster, but when he times himself he hasn't increased his speed. Pt states when he gets flustered and is trying to move fast, then his tremor increase and he has to stop and relax to get them to calm back down.  Pt states he knows he needs to keep coming to therapy, but he isn't improving as fast as he wants to. Pt states he doesn't want to keep missing out on life events because of his impairments. But he can't tell how the improvements he is making in therapy are improving his every day life. Despite this, pt in agreement to complete re-certification at this time to allow him to continue with therapy because he wants to get better. Therapist educated pt on goal to focus more on specific functional mobility tasks he is having  difficulty with at home.  Denies stumbles/falls since last session.     Pt accompanied by: self  PERTINENT HISTORY: Parkinsonism, Vitamin D deficiency, Hyperlipidemia, HTN, poor medication compliance (per chart review)  PAIN:  Are you having pain? Yes: NPRS scale: 5/10, but states it ain't that bad Pain location: R shoulder and low back Pain description: throbbing in shoulder Aggravating factors: overhead movements or reaching back Relieving factors: rest and at home uses a massager  PRECAUTIONS: Fall  RED FLAGS: None   WEIGHT BEARING RESTRICTIONS: No  FALLS: Has patient fallen in last 6 months? Yes. 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   PATIENT GOALS: Get my balance right with my walking, help get my R arm stronger and be able to move it more, improve strength in my legs (specifically the Right)  OBJECTIVE:  Note: Objective measures were completed at Evaluation unless otherwise noted.  DIAGNOSTIC FINDINGS: N/A  COGNITION: Overall cognitive status: Within functional limits for tasks assessed; however, did not assess higher level cognitive tasks   SENSATION: WFL Light touch on screen Pt reports R foot falls asleep sitting on toilet and he has difficult time getting feeling back in it before feeling safe standing up.  COORDINATION: Overall bradykinesia with decreased speed and amplitude  of movements  EDEMA:  None present  MUSCLE TONE:  Not formally assessed  MUSCLE LENGTH: Not formally assessed, but may have some hamstring tightness on R associated with quad weakness and inability to achieve full knee extension in sitting  DTRs:  Not assessed  POSTURE: rounded shoulders, forward head, increased thoracic kyphosis, posterior pelvic tilt, and flexed trunk    LOWER EXTREMITY ROM:     Active ROM Right Eval Left Eval  Hip flexion Decreased in sitting compared to L   Hip extension    Hip abduction    Hip adduction    Hip internal rotation    Hip external rotation    Knee flexion    Knee extension Lacks full knee extension in sitting   Ankle dorsiflexion Limited in sitting Limited in sitting  Ankle plantarflexion    Ankle inversion    Ankle eversion     (Blank rows = not tested)  LOWER EXTREMITY MMT:    MMT Right Eval Left Eval  Hip flexion 3- 4+  Hip extension    Hip abduction    Hip adduction    Hip internal rotation    Hip external rotation    Knee flexion 3 4+  Knee extension 3- 4+  Ankle dorsiflexion 3- 4+  Ankle plantarflexion 3- 4+  Ankle inversion    Ankle eversion    (Blank rows = not tested)  Manual Muscle Test Scale 0/5 = No muscle contraction can be seen or felt 1/5 = Contraction can be felt, but there is no motion 2-/5 = Part moves through incomplete ROM w/ gravity decreased 2/5 = Part moves through complete ROM w/ gravity decreased 2+/5 = Part moves through incomplete ROM (<50%) against gravity or through complete ROM w/ gravity 3-/5 = Part moves through incomplete ROM (>50%) against gravity 3/5 = Part moves through complete ROM against gravity 3+/5 = Part moves through complete ROM against gravity/slight resistance 4-/5= Holds test position against slight to moderate pressure 4/5 = Part moves through complete ROM against gravity/moderate resistance 4+/5= Holds test position against moderate to strong pressure 5/5 = Part moves  through complete ROM against gravity/full resistance  BED MOBILITY:  Sit to supine   Supine to sit   Need to assess  ADL: Reports dificulty getitng on R shoe, specifically having trouble pushing his foot down in his shoe  TRANSFERS: Assistive device utilized: Hurry cane  Sit to stand: Modified independence and SBA Stand to sit: Modified independence and SBA Chair to chair: Modified independence and SBA Floor: would benefit from testing in future  RAMP:  Level of Assistance:   Assistive device utilized: Hurry cane Ramp Comments: would benefit from assessing  CURB:  Level of Assistance:   Assistive device utilized: [employer] Comments: would benefit from assessing  STAIRS: Level of Assistance: CGA and Min A Stair Negotiation Technique: Step to Pattern Forwards with Bilateral Rails Number of Stairs: 4  Height of Stairs: 6in  Comments: leading with R LE in both directions, attempts reciprocal pattern on ascent with sufficient strength, but having uncontrolled anterior lean during descent  GAIT: Gait pattern: step through pattern, decreased arm swing- Right, decreased arm swing- Left, decreased step length- Right, decreased step length- Left, decreased stride length, decreased hip/knee flexion- Right, decreased hip/knee flexion- Left, decreased ankle dorsiflexion- Right, decreased ankle dorsiflexion- Left, decreased trunk rotation, trunk flexed, poor foot clearance- Right, and poor foot clearance- Left Distance walked: 116ft Assistive device utilized: None Level of assistance: CGA Comments: significantly decreased  FUNCTIONAL TESTS:  5 times sit to stand: 38.53 seconds  arms across chest Timed up and go (TUG): 19.46 seconds without AD 6 minute walk test: 10/04/2023: 518ft (166m at 0.16m/s avg) 10 meter walk test: 0.56 m/s without AD Mini-Best: 10/04/2023: 8/28  PATIENT SURVEYS:  ABC scale 17.5% with pt feeling <40% confident on all of the items, pt feels only 30% confident  walking around the house, 10% confident getting in/out of car, and 20% reaching for items  OCULOMOTOR EXAM from 11/16/2023 - performed due to Neurology note reporting concerns of decreased vertical gaze:  Ocular Alignment: normal  Ocular ROM: No Limitations  Spontaneous Nystagmus: absent  Gaze-Induced Nystagmus: absent  Smooth Pursuits: intact, good vertical movements with full range  Horizontal and Vertical Saccades: extra eye movements, slow, and frequently blinks, a few extra eye movements when moving to superior target                                                                                                                               TREATMENT DATE: 01/26/24  PWR! Up targets postural strengthening and antigravity extension,PRW! Step targets transition movements. PWR! Moves target bradykinesia, rigidity, and dyskinesia through targeted functional movements that address four core movement difficulties for people with Parkinson's disease.  NMR:  Seated PWR! Up - 1x10  - verbal cues for big arm movement - pt cited pain in his R shoulder and neck after 1 set  -did a set of rows as below to target postural muscles he could utilize with PWR! Up.   Standing PWR! Step w/ UE modification at bar   - 2x10 B    Modified all fours to standing PWR! Step B 2x10 using chair  Gait in x 150 ft with cues for step length and arm swing, unable to achieve adequate arm swing with cues so progressed to the below intervention  Gait 325 feet, followed by seated rest break and another 325 feet back to clinic pt holding canes in arms while SPT pushed canes from behind to promote arm swing during gait CGA  Alternating foot taps CGA 2x 10 B   Sit to stand PWR! Up, CGA, No UE support 1x5  TE  Seated rows with RTB  - 1x10, no pain   PATIENT EDUCATION: Education details: exercise technique, reassessment  Person educated: Patient Education method: Explanation, demo, vc Education comprehension:  verbalized understanding and needs further education  HOME EXERCISE PROGRAM:  Access Code: [email or web address] URL: [email or web address] Date: 12/02/2023 Prepared by: Connell Kiss  Exercises - Sidelying Thoracic Rotation with Open Book  - 1 x daily - 7 x weekly - 2 sets - 10 reps - Supine Shoulder External Rotation in Abduction  - 1 x daily - 7 x weekly - 2 sets - 10 reps - 10 seconds hold - Seated Scapular Retraction  - 1 x daily - 7 x weekly - 3 sets - 10 reps - 3 seconds hold - Sit to Stand with Arm Swing  - 1 x daily -  7 x weekly - 2 sets - 10 reps   GOALS: Goals reviewed with patient? Yes  SHORT TERM GOALS: Target date: 02/15/2024    Patient will be independent in home exercise program to improve strength/mobility for better functional independence with ADLs. Baseline: need to initiate 10/07/2023: provided 12/02/2023: updated Goal status: IN PROGRESS   LONG TERM GOALS: Target date: 03/28/2024   1.  Patient (< 42 years old) will complete five times sit to stand test in < 12 seconds indicating an increased LE strength and improved balance. Baseline: 09/28/23: 38.53 seconds 11/09/2023: 22.13 seconds with arms across chest 01/04/2024: 20.12 seconds without UE support Goal status: IN PROGRESS   2. Patient will reduce timed up and go to <11 seconds to reduce fall risk and demonstrate improved transfer/gait ability. Baseline: 09/28/23: 19.46 seconds without AD 11/09/2023: 16.96 seconds, no AD, with close SBA for balance safety 01/04/2024: 15.845 seconds, no AD with close SBA for safety Goal status: IN PROGRESS  3.  Patient will increase 10 meter walk test to >1.24m/s as to improve gait speed for better community ambulation and to reduce fall risk. Baseline: 09/28/23: 0.59m/s without AD 11/09/2023: 0.89 m/s (11.50 seconds seconds 10.94 seconds) no AD, with close SBA for safety 01/04/2024: 0.935 m/s without AD, CGA/close SBA for safety Goal status: IN PROGRESS  4.  Patient will increase  six minute walk test distance to >1019ft for progression to community ambulator and improve gait ability Baseline: 10/04/2023: 537ft (112m at 0.59m/s avg) 11/09/2023: 1061 feet (323 meters, Avg speed 0.897 m/s) without AD Goal status: MET and Advanced 11/09/2023 Goal Updated: distance > 1384ft 01/13/2024: 832  ft mostly without SPC but then pt needed SPC due to fatigue, required one break (standing)   5. Patient will increase MiniBest Test score to >18/28 to indicate a reduced risk for falling and demonstrate increased independence with functional mobility and ADLs.  Baseline: 10/04/2023: 8/28  11/16/2023: 16/28  01/22/2024: deferred 6/17: 20/28  Goal status: MET  6. Patient will increase ABC scale score >80% to demonstrate better functional mobility and better confidence with ADLs.   Baseline: 09/28/23: 17.5% 11/09/2023: 21.25% - pt reports he rated the test as is if he was not using his walker because this is his long term goal  01/04/2024: 35.625% - continuing to rate it as if he is not using his walker   Goal status: IN PROGRESS   ASSESSMENT:  CLINICAL IMPRESSION:   Patient arrived with good motivation for completion of pt activities. Pt had right shoulder pain with PWR! Up and any attempt at shoulder elevation so these exercises were held this date. Pt did do well with PWR! Step exercise. Pt still having a lot of trouble with reciprocal arm movements with gait but when he can achieve this his gait shows improvements. Pt will continue to benefit from skilled physical therapy intervention to address impairments, improve QOL, and attain therapy goals.     OBJECTIVE IMPAIRMENTS: Abnormal gait, decreased activity tolerance, decreased balance, decreased coordination, decreased endurance, decreased knowledge of condition, decreased knowledge of use of DME, decreased mobility, difficulty walking, decreased ROM, decreased strength, decreased safety awareness, improper body mechanics, postural dysfunction,  and pain.   ACTIVITY LIMITATIONS: carrying, lifting, bending, standing, squatting, sleeping, stairs, transfers, bed mobility, continence, bathing, toileting, dressing, reach over head, hygiene/grooming, and locomotion level  PARTICIPATION LIMITATIONS: meal prep, cleaning, laundry, and community activity  PERSONAL FACTORS: Fitness and 3+ comorbidities: Vitamin D deficiency, Hyperlipidemia, and HTN are also affecting patient's functional outcome.  REHAB POTENTIAL: Good  CLINICAL DECISION MAKING: Evolving/moderate complexity  EVALUATION COMPLEXITY: Moderate  PLAN:  PT FREQUENCY: 1-2x/week  PT DURATION: 12 weeks  PLANNED INTERVENTIONS: 97164- PT Re-evaluation, 97110-Therapeutic exercises, 97530- Therapeutic activity, 97112- Neuromuscular re-education, 97535- Self Care, [id number]- Manual therapy, [phone]- Gait training, [phone]- Orthotic Fit/training, [phone]- Canalith repositioning, [phone]- Electrical stimulation (manual), Patient/Family education, Balance training, Stair training, Joint mobilization, Vestibular training, Visual/preceptual remediation/compensation, DME instructions, Cryotherapy, and Moist heat  PLAN FOR NEXT SESSION:  **focus on functional mobility tasks patient reports as challenging** - review updated HEP  - gait training with increased intensity - dynamic gait with focus on increased upright posture and speed of ambulation with arm swing (add AWs to UEs with boxing gloves)  - add dual-task challenges of head rotations, stepping over obstacles, sudden turns, and changes in gait speed - increased focus on turns - stepping in different directions   - with dual-task of Blaze Pods to promote increased speed/amplitude of movements  - continue arm reaching and trunk rotation (specifically to R) - standing balance: narrow BOS/SLS, on compliant surfaces, incline, eyes open&eyes closed - stepping strategy re-training: focus on posterior stepping - large amplitude UE movements with  promotion of improved upright posture - B LE strengthening (R>L)  - progress to floor transfers    Note: Portions of this document were prepared using Dragon voice recognition software and although reviewed may contain unintentional dictation errors in syntax, grammar, or spelling.  Lonni KATHEE Gainer PT ,DPT Physical Therapist- [facility]   8:09 AM 01/26/24

## 2024-01-26 NOTE — Therapy (Signed)
 Occupational Therapy Treatment Note   Patient Name: Daniel Daugherty MRN: [id number] DOB:October 08, 1965, 58 y.o., male Today's Date: 01/26/2024  PCP: N/A REFERRING PROVIDER: Odell Tor Edra CINDERELLA, MD  END OF SESSION:  OT End of Session - 01/26/24 0858     Visit Number 22    Number of Visits 48    Date for OT Re-Evaluation 03/28/24    OT Start Time 1400    OT Stop Time 1445    OT Time Calculation (min) 45 min    Activity Tolerance Patient tolerated treatment well    Behavior During Therapy WFL for tasks assessed/performed               Past Medical History:  Diagnosis Date   Hypertension    Past Surgical History:  Procedure Laterality Date   DG HAND LEFT COMPLETE ([facility] HX) Left 04/13/1999   There are no active problems to display for this patient.   ONSET DATE: 05/2021  REFERRING DIAG: Parkinson's Disease  THERAPY DIAG:  Muscle weakness (generalized)  Other lack of coordination  Rationale for Evaluation and Treatment: Rehabilitation  SUBJECTIVE:   SUBJECTIVE STATEMENT:  Pt. reports that he is looking forward to his cruise next month. Pt accompanied by: self  PERTINENT HISTORY:   Pt. is a 58 y.o. male who was diagnosed with Parkinson's Disease in 2022. Pt. reports having had a progressive decline in function since having hernia surgery, and right shoulder rotator cuff surgery. PMHx included: HTN. Of note; Pt. was being followed by [facility], and receiving Home health therapy services. Pt. changed physicians, and was referred for outpatient rehab services here.  PRECAUTIONS: None  WEIGHT BEARING RESTRICTIONS: No  PAIN:  Are you having pain? 0/10  FALLS: Has patient fallen in last 6 months? No, 1 small slip near the bed tripped over shoes  LIVING ENVIRONMENT: Lives with: lives alone Lives in: [place],  Stairs: Entry level Has following equipment at home: Quad cane small base, rollator, shower chair, grab bars, hand rail at [employer]  PLOF:  Independent  PATIENT GOALS: Improve self-care  OBJECTIVE:  Note: Objective measures were completed at Evaluation unless otherwise noted.  HAND DOMINANCE: Right  ADLs:  Assist at home:  Ex wife, and children assist several times a week  Transfers/ambulation related to ADLs: Modified Independent Eating: Difficulty using dominant right hand. Drops food when scooping, Both hands for stabilizing drinks, and uses a straw. Unable to cut food.  Grooming: Increased time required for all grooming tasks. UB Dressing: Difficulty managing buttons LB Dressing: Less assist with pants with elastic, more assist with dress pants Toileting: Increased time for hygiene. Bathing: Requires increased time to complete Tub Shower transfers: Modified Independence with increased time  IADLs: Shopping: Son assists with grocery shopping Light housekeeping: Son's assist. Pt. Does light rising off of dishes. Meal Prep: Increased time. Independent with microwave, and air fryer. Difficulty opening bottles, containers Community mobility:  Transportation, light driving to grocery store Medication management:  Son's set-up weekly pillbox, Pt. Is responsible for taking the medications Financial management: Son's assist with monthly finances Handwriting: 50% legible Hobbies: Dancing, watching sports Career/work: Restaurant manager, fast food  MOBILITY STATUS: Uses a cane; shuffling gait  FUNCTIONAL OUTCOME MEASURES:  Mam-20 sum score: 53/80, MAM Measure score: 52.4   UPPER EXTREMITY ROM:    Active ROM Right eval Right 11/09/23 01/04/24 Left Eval [facility] overall  Shoulder flexion WFL [phone]) 873(862)   Shoulder abduction 52(100) 60(110) 31(881)   Shoulder adduction      Shoulder  extension      Shoulder internal rotation      Shoulder external rotation      Elbow flexion [facility] WFL   Elbow extension [facility] WFL   Wrist flexion      Wrist extension [facility] WFL   Wrist ulnar deviation      Wrist radial deviation       Wrist pronation      Wrist supination      (Blank rows = not tested)  UPPER EXTREMITY MMT:     MMT Right eval Right 11/09/23 Right 01/04/24 Left Eval 4-/5 overall Left 11/09/23 4/5 Overall  Shoulder flexion 3/5 3-/5 3-/5 3-/5   Shoulder abduction 2/5 2/5 2+/5 2+/5   Shoulder adduction       Shoulder extension       Shoulder internal rotation       Shoulder external rotation       Middle trapezius       Lower trapezius       Elbow flexion 3/5 3+/5 3+/5 3+/5   Elbow extension 3/5 3+5 4-/5 4-/5   Wrist flexion       Wrist extension 3/5 3/5 3/5 3/5   Wrist ulnar deviation       Wrist radial deviation       Wrist pronation       Wrist supination       (Blank rows = not tested)  HAND FUNCTION:   Eval: Grip strength: Right: 50 lbs; Left: 34 lbs, Lateral pinch: Right: 17 lbs, Left: 9 lbs, 3 point pinch: Right: 12 lbs, Left: 11 lbs  11/09/23: Grip strength: Right: 60 lbs; Left: 35 lbs, Lateral pinch: Right: 19 lbs, Left: 11 lbs, 3 point pinch: Right: 13 lbs, Left: 9 lbs  01/04/24:Grip strength: Right: 40 lbs; Left: 41 lbs, Lateral pinch: Right: 17 lbs, Left: 8 lbs, 3 point pinch: Right: 9 lbs, Left: 10 lbs  COORDINATION:  Eval: 9 Hole Peg test: Right: 1 min. & 46 sec; Left: 53 sec  11/09/2023: 9 Hole Peg test: Right: 1 min. & 22 sec; Left: 46 sec  01/04/24: 9 Hole Peg test: Right: 1 min. & 11 sec; Left: 46 sec   SENSATION:  Light touch: WFL Proprioception: WFL  EDEMA: N/A  COGNITION: Overall cognitive status: Within functional limits for tasks assessed  VISION:  Does not wear glasses.  VISION ASSESSMENT: To be further assessed in functional context  PERCEPTION: WFL  PRAXIS: Impaired: Motor planning                                                                                                                         TREATMENT DATE: 01/26/24  Therapeutic Ex:   -UE strengthening through reciprocal motion using the SciFit for 10 min. at resistance level 4.0  with constant monitoring of the BUEs. -Pectoral stretches were performed in sitting. -Education was provided about home pectoral stretches in standing at the wall.     Manual Therapy:   -  Soft tissue massage was performed to the scapular, and UE musculature 2/2 tightness -Pt. tolerated scapular mobilization for elevation, depression, and abduction/rotation in sitting to normalize tone, decrease tightness, and prepare the RUE ROM, strengthening, and functional use.   -Manual therapy was performed independent of, and in preparation for therapeutic Ex.    Neuromuscular re-education:   -Facilitated [facility] skills using the Jamar Tweezer Dexterity Task using resistive tweezers to pick up 1 thin sticks from a horizontal position, and sustaining the grasp while preparing to place them into a vertical position with the pegboard placed at a flat tabletop surface. -Facilitated right hand [facility] skills grasping the small 1 thin Jamar sticksfrom a horizontal position from a shallow dish, and placing them vertically in the Jamar Pegboard.      PATIENT EDUCATION: Education details: UE ROM, ADL functional status Person educated: Patient Education method: Explanation, Demonstration, Tactile cues, and Verbal cues Education comprehension: needs further education  HOME EXERCISE PROGRAM:  Continue to assess, and provide as indicated.   GOALS: Goals reviewed with patient? Yes  SHORT TERM GOALS: Target date: 02/15/2024      Pt. Will be independent with HEPs for RUE. Baseline: 01/04/24: Independent 11/09/2023: Independent Eval: No current HEP Goal status:Ongoing  LONG TERM GOALS: Target date: 03/28/2024  1.  Pt. Will increase right shoulder abduction by 10 degrees to assist with UE dressing Baseline: 01/04/24: 31(881) Pt. reports having donning his shirt is easier with his new routine 11/09/23: Right 60/(110) Eval: Right 52(100) Goal status: Ongoing  2.  Pt. Will increase BUE strength by 2 mm grades to  assist with ADLs. Baseline: 01/04/24:  Right: shoulder flexion: 3-/5, abduction: 2/5, elbow flexion 4-/5, extension: 3+/5, wrist extension: 3/5. Left: 4/5 overallEval: Right: shoulder flexion: 3/5, abduction: 2/5, elbow flexion/extension: 3/5, wrist extension: 3/5. Left: 4-/5 overall4/08/25: Right: shoulder flexion: 3-/5, abduction: 2/5, elbow flexion/extension: 3+/5, wrist extension: 3/5. Left: 4/5 overallEval: Right: shoulder flexion: 3/5, abduction: 2/5, elbow flexion/extension: 3/5, wrist extension: 3/5. Left: 4-/5 overall Goal status: Ongoing  3.  Pt. Will perform self-feeding with modified independence Baseline: 01/04/24: Pt. reports making improvements with self-feeding. Pt. has difficulty at times with bringing his spoon to his mouth, often bringing an empty spoon to his mouth 11/09/23: Pt. Continues to have difficulty using dominant right hand. Drops food when scooping, Both hands for stabilizing drinks, and uses a straw. Unable to cut food. Eval: Difficulty using dominant right hand. Drops food when scooping, Both hands for stabilizing drinks, and uses a straw. Unable to cut food. Goal status: Ongoing  4.  Pt. Will demonstrate independence with proper A/E techniques/compensatory strategies for ADL/IADLs.  Baseline: 01/04/24: Continue ongoing as needed. 11/09/23: Continue Eval: Education to be provided Goal status: Ongoing  5.  Pt. Will write a sentence with 75% legibility with modified independence Baseline: 01/04/24:  One sentence completed in 2 min. & 51 sec. With 50% legibility.11/09/2023: 50% legibility for one sentence. Eval: 50% legibility for one printed sentence Goal status: Ongoing   6.  Pt. Will improve bilateral [facility] skills by 3 sec. on the 9 Hole Peg Test to be able to manipulate small objects.  Baseline: 9 Hole Peg Test: R: 1 min. & 11 sec.  L: 46 sec. 11/09/2023: 9 Hole Peg test: Right: 1 min. & 22 sec; Left: 46 sec Eval: 9 Hole Peg test: Right: 1 min. & 46 sec; Left: 53 sec Goal  status:New  7. Pt. Will improve the Mam-20 score by 5 points to reflect ADL/IADL improvement. Baseline:  Mam-20 sum score: 53/80, Mam Measure score: 52.4 Goal status New   ASSESSMENT:  CLINICAL IMPRESSION:  Pt.  presents with tightness, and tone in the right scapular, and shoulder musculature. Pt. Responded well to pectoral stretches, and manual therapy, and UE strengthening on the SciFit. Pt. presented with difficulty sustaining a secure tweezer grasp on the 1 sticks while extending his wrist and preparing to place them  vertically in the pegboard. Pt. continues to benefit from OT services to work on improving overall bilateral UE functioning in order to maximize engagement in, and efficiency in ADLs, and IADL tasks, and provide education about compensatory strategies.    PERFORMANCE DEFICITS: in functional skills including ADLs, IADLs, coordination, dexterity, proprioception, sensation, ROM, strength, pain, Fine motor control, Gross motor control, and UE functional use, cognitive skills including , and psychosocial skills including coping strategies, environmental adaptation, and routines and behaviors.   IMPAIRMENTS: are limiting patient from ADLs, IADLs, and leisure.   CO-MORBIDITIES: may have co-morbidities  that affects occupational performance. Patient will benefit from skilled OT to address above impairments and improve overall function.  MODIFICATION OR ASSISTANCE TO COMPLETE EVALUATION: Min-Moderate modification of tasks or assist with assess necessary to complete an evaluation.  OT OCCUPATIONAL PROFILE AND HISTORY: Detailed assessment: Review of records and additional review of physical, cognitive, psychosocial history related to current functional performance.  CLINICAL DECISION MAKING: Moderate - several treatment options, min-mod task modification necessary  REHAB POTENTIAL: Good  EVALUATION COMPLEXITY: Moderate    PLAN:  OT FREQUENCY: 2x/week  OT DURATION: 12  weeks  PLANNED INTERVENTIONS: 97168 OT Re-evaluation, 97535 self care/ADL training, 02889 therapeutic exercise, 97530 therapeutic activity, 97112 neuromuscular re-education, 97140 manual therapy, 97018 paraffin, 02989 moist heat, 97034 contrast bath, 97760 Orthotics management and training, 02239 Splinting (initial encounter), energy conservation, patient/family education, and DME and/or AE instructions  RECOMMENDED OTHER SERVICES:   PT  CONSULTED AND AGREED WITH PLAN OF CARE: Patient  PLAN FOR NEXT SESSION:   Treatment  Richardson Otter, MS, OTR/L   01/26/2024, 9:12 AM

## 2024-01-27 ENCOUNTER — Ambulatory Visit: Admitting: Physical Therapy

## 2024-01-27 ENCOUNTER — Ambulatory Visit: Admitting: Occupational Therapy

## 2024-01-27 DIAGNOSIS — R2681 Unsteadiness on feet: Secondary | ICD-10-CM

## 2024-01-27 DIAGNOSIS — M6281 Muscle weakness (generalized): Secondary | ICD-10-CM

## 2024-01-27 DIAGNOSIS — R278 Other lack of coordination: Secondary | ICD-10-CM

## 2024-01-27 DIAGNOSIS — R269 Unspecified abnormalities of gait and mobility: Secondary | ICD-10-CM

## 2024-01-27 DIAGNOSIS — R531 Weakness: Secondary | ICD-10-CM

## 2024-01-27 DIAGNOSIS — R262 Difficulty in walking, not elsewhere classified: Secondary | ICD-10-CM

## 2024-01-27 NOTE — Therapy (Signed)
 Occupational Therapy Treatment Note   Patient Name: Daniel Daugherty MRN: [id number] DOB:1965/12/25, 58 y.o., male Today's Date: 01/27/2024  PCP: N/A REFERRING PROVIDER: Odell Tor Edra CINDERELLA, MD  END OF SESSION:  OT End of Session - 01/27/24 2209     Visit Number 23    Number of Visits 48    Date for OT Re-Evaluation 03/28/24    OT Start Time 0930    OT Stop Time 1015    OT Time Calculation (min) 45 min    Activity Tolerance Patient tolerated treatment well    Behavior During Therapy WFL for tasks assessed/performed               Past Medical History:  Diagnosis Date   Hypertension    Past Surgical History:  Procedure Laterality Date   DG HAND LEFT COMPLETE ([facility] HX) Left 04/13/1999   There are no active problems to display for this patient.   ONSET DATE: 05/2021  REFERRING DIAG: Parkinson's Disease  THERAPY DIAG:  Muscle weakness (generalized)  Rationale for Evaluation and Treatment: Rehabilitation  SUBJECTIVE:   SUBJECTIVE STATEMENT:  Pt. reports doing okay today Pt accompanied by: self  PERTINENT HISTORY:   Pt. is a 58 y.o. male who was diagnosed with Parkinson's Disease in 2022. Pt. reports having had a progressive decline in function since having hernia surgery, and right shoulder rotator cuff surgery. PMHx included: HTN. Of note; Pt. was being followed by [facility], and receiving Home health therapy services. Pt. changed physicians, and was referred for outpatient rehab services here.  PRECAUTIONS: None  WEIGHT BEARING RESTRICTIONS: No  PAIN:  Are you having pain? 0/10  FALLS: Has patient fallen in last 6 months? No, 1 small slip near the bed tripped over shoes  LIVING ENVIRONMENT: Lives with: lives alone Lives in: [place],  Stairs: Entry level Has following equipment at home: Quad cane small base, rollator, shower chair, grab bars, hand rail at [employer]  PLOF: Independent  PATIENT GOALS: Improve self-care  OBJECTIVE:  Note: Objective  measures were completed at Evaluation unless otherwise noted.  HAND DOMINANCE: Right  ADLs:  Assist at home:  Ex wife, and children assist several times a week  Transfers/ambulation related to ADLs: Modified Independent Eating: Difficulty using dominant right hand. Drops food when scooping, Both hands for stabilizing drinks, and uses a straw. Unable to cut food.  Grooming: Increased time required for all grooming tasks. UB Dressing: Difficulty managing buttons LB Dressing: Less assist with pants with elastic, more assist with dress pants Toileting: Increased time for hygiene. Bathing: Requires increased time to complete Tub Shower transfers: Modified Independence with increased time  IADLs: Shopping: Son assists with grocery shopping Light housekeeping: Son's assist. Pt. Does light rising off of dishes. Meal Prep: Increased time. Independent with microwave, and air fryer. Difficulty opening bottles, containers Community mobility:  Transportation, light driving to grocery store Medication management:  Son's set-up weekly pillbox, Pt. Is responsible for taking the medications Financial management: Son's assist with monthly finances Handwriting: 50% legible Hobbies: Dancing, watching sports Career/work: Restaurant manager, fast food  MOBILITY STATUS: Uses a cane; shuffling gait  FUNCTIONAL OUTCOME MEASURES:  Mam-20 sum score: 53/80, MAM Measure score: 52.4   UPPER EXTREMITY ROM:    Active ROM Right eval Right 11/09/23 01/04/24 Left Eval [facility] overall  Shoulder flexion WFL [phone])   Shoulder abduction 52(100) 60(110) 31(881)   Shoulder adduction      Shoulder extension      Shoulder internal rotation  Shoulder external rotation      Elbow flexion [facility] WFL   Elbow extension [facility] WFL   Wrist flexion      Wrist extension [facility] WFL   Wrist ulnar deviation      Wrist radial deviation      Wrist pronation      Wrist supination      (Blank rows = not  tested)  UPPER EXTREMITY MMT:     MMT Right eval Right 11/09/23 Right 01/04/24 Left Eval 4-/5 overall Left 11/09/23 4/5 Overall  Shoulder flexion 3/5 3-/5 3-/5 3-/5   Shoulder abduction 2/5 2/5 2+/5 2+/5   Shoulder adduction       Shoulder extension       Shoulder internal rotation       Shoulder external rotation       Middle trapezius       Lower trapezius       Elbow flexion 3/5 3+/5 3+/5 3+/5   Elbow extension 3/5 3+5 4-/5 4-/5   Wrist flexion       Wrist extension 3/5 3/5 3/5 3/5   Wrist ulnar deviation       Wrist radial deviation       Wrist pronation       Wrist supination       (Blank rows = not tested)  HAND FUNCTION:   Eval: Grip strength: Right: 50 lbs; Left: 34 lbs, Lateral pinch: Right: 17 lbs, Left: 9 lbs, 3 point pinch: Right: 12 lbs, Left: 11 lbs  11/09/23: Grip strength: Right: 60 lbs; Left: 35 lbs, Lateral pinch: Right: 19 lbs, Left: 11 lbs, 3 point pinch: Right: 13 lbs, Left: 9 lbs  01/04/24:Grip strength: Right: 40 lbs; Left: 41 lbs, Lateral pinch: Right: 17 lbs, Left: 8 lbs, 3 point pinch: Right: 9 lbs, Left: 10 lbs  COORDINATION:  Eval: 9 Hole Peg test: Right: 1 min. & 46 sec; Left: 53 sec  11/09/2023: 9 Hole Peg test: Right: 1 min. & 22 sec; Left: 46 sec  01/04/24: 9 Hole Peg test: Right: 1 min. & 11 sec; Left: 46 sec   SENSATION:  Light touch: WFL Proprioception: WFL  EDEMA: N/A  COGNITION: Overall cognitive status: Within functional limits for tasks assessed  VISION:  Does not wear glasses.  VISION ASSESSMENT: To be further assessed in functional context  PERCEPTION: WFL  PRAXIS: Impaired: Motor planning                                                                                                                         TREATMENT DATE: 01/26/24  Therapeutic Ex:   -UE strengthening through reciprocal motion using the SciFit for 10 min. at resistance level 4.0 with constant monitoring of the BUEs. -Pectoral stretches were  performed in standing at the wall. -Education was provided about home pectoral stretches in standing at the wall.   POLLY was performed using a 1.5# dowel, followed by a medium incline wedge, and a  large incline wedge consecutively.   Manual Therapy:   -Soft tissue massage was performed to the scapular, and UE musculature 2/2 tightness -Pt. tolerated scapular mobilization for elevation, depression, and abduction/rotation in sitting to normalize tone, decrease tightness, and prepare the RUE for ROM, strengthening, and functional use.   -Manual therapy was performed independent of, and in preparation for therapeutic Ex.    PATIENT EDUCATION: Education details: UE ROM, ADL functional status Person educated: Patient Education method: Explanation, Demonstration, Tactile cues, and Verbal cues Education comprehension: needs further education  HOME EXERCISE PROGRAM:  Continue to assess, and provide as indicated.   GOALS: Goals reviewed with patient? Yes  SHORT TERM GOALS: Target date: 02/15/2024      Pt. Will be independent with HEPs for RUE. Baseline: 01/04/24: Independent 11/09/2023: Independent Eval: No current HEP Goal status:Ongoing  LONG TERM GOALS: Target date: 03/28/2024  1.  Pt. Will increase right shoulder abduction by 10 degrees to assist with UE dressing Baseline: 01/04/24: 31(881) Pt. reports having donning his shirt is easier with his new routine 11/09/23: Right 60/(110) Eval: Right 52(100) Goal status: Ongoing  2.  Pt. Will increase BUE strength by 2 mm grades to assist with ADLs. Baseline: 01/04/24:  Right: shoulder flexion: 3-/5, abduction: 2/5, elbow flexion 4-/5, extension: 3+/5, wrist extension: 3/5. Left: 4/5 overallEval: Right: shoulder flexion: 3/5, abduction: 2/5, elbow flexion/extension: 3/5, wrist extension: 3/5. Left: 4-/5 overall4/08/25: Right: shoulder flexion: 3-/5, abduction: 2/5, elbow flexion/extension: 3+/5, wrist extension: 3/5. Left: 4/5 overallEval: Right:  shoulder flexion: 3/5, abduction: 2/5, elbow flexion/extension: 3/5, wrist extension: 3/5. Left: 4-/5 overall Goal status: Ongoing  3.  Pt. Will perform self-feeding with modified independence Baseline: 01/04/24: Pt. reports making improvements with self-feeding. Pt. has difficulty at times with bringing his spoon to his mouth, often bringing an empty spoon to his mouth 11/09/23: Pt. Continues to have difficulty using dominant right hand. Drops food when scooping, Both hands for stabilizing drinks, and uses a straw. Unable to cut food. Eval: Difficulty using dominant right hand. Drops food when scooping, Both hands for stabilizing drinks, and uses a straw. Unable to cut food. Goal status: Ongoing  4.  Pt. Will demonstrate independence with proper A/E techniques/compensatory strategies for ADL/IADLs.  Baseline: 01/04/24: Continue ongoing as needed. 11/09/23: Continue Eval: Education to be provided Goal status: Ongoing  5.  Pt. Will write a sentence with 75% legibility with modified independence Baseline: 01/04/24:  One sentence completed in 2 min. & 51 sec. With 50% legibility.11/09/2023: 50% legibility for one sentence. Eval: 50% legibility for one printed sentence Goal status: Ongoing   6.  Pt. Will improve bilateral [facility] skills by 3 sec. on the 9 Hole Peg Test to be able to manipulate small objects.  Baseline: 9 Hole Peg Test: R: 1 min. & 11 sec.  L: 46 sec. 11/09/2023: 9 Hole Peg test: Right: 1 min. & 22 sec; Left: 46 sec Eval: 9 Hole Peg test: Right: 1 min. & 46 sec; Left: 53 sec Goal status:New  7. Pt. Will improve the Mam-20 score by 5 points to reflect ADL/IADL improvement. Baseline: Mam-20 sum score: 53/80, Mam Measure score: 52.4 Goal status New   ASSESSMENT:  CLINICAL IMPRESSION:  Pt. presents with tone in the right scapular, and shoulder musculature. Pt. responded well to pectoral stretches, manual therapy, and UE strengthening on the SciFit. Pt. required support proximally for AAROM  on each of the inclines. Pt. reports neck stiffness, and tightness at the shoulder, and scapular musculature. Pt. continues to  benefit from OT services to work on improving overall bilateral UE functioning in order to maximize engagement in, and efficiency in ADLs, and IADL tasks, and provide education about compensatory strategies.    PERFORMANCE DEFICITS: in functional skills including ADLs, IADLs, coordination, dexterity, proprioception, sensation, ROM, strength, pain, Fine motor control, Gross motor control, and UE functional use, cognitive skills including , and psychosocial skills including coping strategies, environmental adaptation, and routines and behaviors.   IMPAIRMENTS: are limiting patient from ADLs, IADLs, and leisure.   CO-MORBIDITIES: may have co-morbidities  that affects occupational performance. Patient will benefit from skilled OT to address above impairments and improve overall function.  MODIFICATION OR ASSISTANCE TO COMPLETE EVALUATION: Min-Moderate modification of tasks or assist with assess necessary to complete an evaluation.  OT OCCUPATIONAL PROFILE AND HISTORY: Detailed assessment: Review of records and additional review of physical, cognitive, psychosocial history related to current functional performance.  CLINICAL DECISION MAKING: Moderate - several treatment options, min-mod task modification necessary  REHAB POTENTIAL: Good  EVALUATION COMPLEXITY: Moderate    PLAN:  OT FREQUENCY: 2x/week  OT DURATION: 12 weeks  PLANNED INTERVENTIONS: 97168 OT Re-evaluation, 97535 self care/ADL training, 02889 therapeutic exercise, 97530 therapeutic activity, 97112 neuromuscular re-education, 97140 manual therapy, 97018 paraffin, 02989 moist heat, 97034 contrast bath, 97760 Orthotics management and training, 02239 Splinting (initial encounter), energy conservation, patient/family education, and DME and/or AE instructions  RECOMMENDED OTHER SERVICES:   PT  CONSULTED AND  AGREED WITH PLAN OF CARE: Patient  PLAN FOR NEXT SESSION:   Treatment  Richardson Otter, MS, OTR/L   01/27/2024, 10:11 PM

## 2024-01-27 NOTE — Therapy (Signed)
 OUTPATIENT PHYSICAL THERAPY NEURO TREATMENT     Patient Name: Daniel Daugherty MRN: [id number] DOB:02/12/1966, 58 y.o., male Today's Date: 01/27/2024   PCP: Odell Chard, Edra GRADE, MD  REFERRING PROVIDER: Odell Chard, Edra GRADE, MD   END OF SESSION:    PT End of Session - 01/27/24 0853     Visit Number 23    Number of Visits 43   Corrected   Date for PT Re-Evaluation 03/28/24    Authorization Type Medicare 2025    Progress Note Due on Visit 10    PT Start Time 0849    PT Stop Time 0930    PT Time Calculation (min) 41 min    Equipment Utilized During Treatment Gait belt    Activity Tolerance Patient tolerated treatment well    Behavior During Therapy WFL for tasks assessed/performed            Past Medical History:  Diagnosis Date   Hypertension    Past Surgical History:  Procedure Laterality Date   DG HAND LEFT COMPLETE ([facility] HX) Left 04/13/1999   There are no active problems to display for this patient.   ONSET DATE: 2022 is when he noticed the change and that was around the same time he was diagnosed with Parkinson's  REFERRING DIAG:  G20.A1 (ICD-10-CM) - Parkinson disease (HCC)  Z74.1 (ICD-10-CM) - Requires daily assistance for activities of daily living (ADL) and comfort needs   THERAPY DIAG:  No diagnosis found.  Rationale for Evaluation and Treatment: Rehabilitation  SUBJECTIVE:                                                                                                                                                                                             SUBJECTIVE STATEMENT: TODAY 01/27/24: Pt reports no falls or LOB since last session. Had a good weekend and got to watch his grand kids play some basketball.     PREVIOUSLY-  Pt reports he has been doing alright.  States his son turned 34y.o. this past Sunday and they had a cookout to celebrate. Pt reports he has gotten lazy since last session, not performing his HEP, and has been  kicking back. However, later states he has been encouraging himself to stay active and walk every day using his walker as needed  Pt reports he still hasn't called Neurology office to set-up an appointment. Pt reports he stopped coming to therapy because he felt like he wasn't getting any better and it was discouraging. Reports he doesn't feel safe trying to do the things at home that he does at therapy. Therapist educated pt that  he is only to do the prescribed HEP at home and that the focus of therapy sessions is to challenge him to perform tasks that he is not safe to perform without a skilled physical therapist in order to advance his mobility.  Pt states he is ready to get better and he is frustrated with his CLOF. Pt reports he has looked up some things on social media for support and to find ideas on how to move more safely and independently. Pt sates the idea of the [employer] group is intimidating to him and he is concerned he will get frustrated by his inability to do as well as other people in the group.   Pt sates he is trying to get dressed faster, but when he times himself he hasn't increased his speed. Pt states when he gets flustered and is trying to move fast, then his tremor increase and he has to stop and relax to get them to calm back down.  Pt states he knows he needs to keep coming to therapy, but he isn't improving as fast as he wants to. Pt states he doesn't want to keep missing out on life events because of his impairments. But he can't tell how the improvements he is making in therapy are improving his every day life. Despite this, pt in agreement to complete re-certification at this time to allow him to continue with therapy because he wants to get better. Therapist educated pt on goal to focus more on specific functional mobility tasks he is having difficulty with at home.  Denies stumbles/falls since last session.     Pt accompanied by: self  PERTINENT HISTORY:  Parkinsonism, Vitamin D deficiency, Hyperlipidemia, HTN, poor medication compliance (per chart review)  PAIN:  Are you having pain? Yes: NPRS scale: 5/10, but states it ain't that bad Pain location: R shoulder and low back Pain description: throbbing in shoulder Aggravating factors: overhead movements or reaching back Relieving factors: rest and at home uses a massager  PRECAUTIONS: Fall  RED FLAGS: None   WEIGHT BEARING RESTRICTIONS: No  FALLS: Has patient fallen in last 6 months? Yes. 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   PATIENT GOALS: Get my balance right with my walking, help get my R arm stronger and be able to move it more, improve strength in my legs (specifically the Right)  OBJECTIVE:  Note: Objective measures were completed at Evaluation unless otherwise noted.  DIAGNOSTIC FINDINGS: N/A  COGNITION: Overall cognitive status: Within functional limits for tasks assessed; however, did not assess higher level cognitive tasks   SENSATION: WFL Light touch on screen Pt reports R foot falls asleep sitting on toilet and he has difficult time getting feeling back in it before feeling safe standing up.  COORDINATION: Overall bradykinesia with decreased speed and amplitude of movements  EDEMA:  None present  MUSCLE TONE: Not formally assessed  MUSCLE LENGTH: Not formally assessed, but  may have some hamstring tightness on  R associated with quad weakness and inability to achieve full knee extension in sitting  DTRs:  Not assessed  POSTURE: rounded shoulders, forward head, increased thoracic kyphosis, posterior pelvic tilt, and flexed trunk    LOWER EXTREMITY ROM:     Active ROM Right Eval Left Eval  Hip flexion Decreased in sitting compared to L   Hip extension    Hip abduction    Hip adduction    Hip internal rotation    Hip external rotation    Knee flexion    Knee extension Lacks full knee extension in sitting   Ankle dorsiflexion Limited in sitting Limited in sitting  Ankle plantarflexion    Ankle inversion    Ankle eversion     (Blank rows = not tested)  LOWER EXTREMITY MMT:    MMT Right Eval Left Eval  Hip flexion 3- 4+  Hip extension    Hip abduction    Hip adduction    Hip internal rotation    Hip external rotation    Knee flexion 3 4+  Knee extension 3- 4+  Ankle dorsiflexion 3- 4+  Ankle plantarflexion 3- 4+  Ankle inversion    Ankle eversion    (Blank rows = not tested)  Manual Muscle Test Scale 0/5 = No muscle contraction can be seen or felt 1/5 = Contraction can be felt, but there is no motion 2-/5 = Part moves through incomplete ROM w/ gravity decreased 2/5 = Part moves through complete ROM w/ gravity decreased 2+/5 = Part moves through incomplete ROM (<50%) against gravity or through complete ROM w/ gravity 3-/5 = Part moves through incomplete ROM (>50%) against gravity 3/5 = Part moves through complete ROM against gravity 3+/5 = Part moves through complete ROM against gravity/slight resistance 4-/5= Holds test position against slight to moderate pressure 4/5 = Part moves through complete ROM against gravity/moderate resistance 4+/5= Holds test position against moderate to strong pressure 5/5 = Part moves through complete ROM against gravity/full resistance  BED MOBILITY:  Sit to supine   Supine to sit   Need to  assess  ADL: Reports dificulty getitng on R shoe, specifically having trouble pushing his foot down in his shoe  TRANSFERS: Assistive device utilized: Hurry cane  Sit to stand: Modified independence and SBA Stand to sit: Modified independence and SBA Chair to chair: Modified independence and SBA Floor: would benefit from testing in future  RAMP:  Level of Assistance:   Assistive device utilized: Hurry cane Ramp Comments: would benefit from assessing  CURB:  Level of Assistance:   Assistive device utilized: [employer] Comments: would benefit from assessing  STAIRS: Level of Assistance: CGA and Min A Stair Negotiation Technique: Step to Pattern Forwards with Bilateral Rails Number of Stairs: 4  Height of Stairs: 6in  Comments: leading with R LE in both directions, attempts reciprocal pattern on ascent with sufficient strength, but having uncontrolled anterior lean during descent  GAIT: Gait pattern: step through pattern, decreased arm swing- Right, decreased arm swing- Left, decreased step length- Right, decreased step length- Left, decreased stride length, decreased hip/knee flexion- Right, decreased hip/knee flexion- Left, decreased ankle dorsiflexion- Right, decreased ankle dorsiflexion- Left, decreased trunk rotation, trunk flexed, poor foot clearance- Right, and poor foot clearance- Left Distance walked: 172ft Assistive device utilized: None Level of assistance: CGA Comments: significantly decreased  FUNCTIONAL TESTS:  5 times sit to stand: 38.53 seconds arms across chest Timed up and go (TUG): 19.46 seconds without AD 6 minute walk test:  10/04/2023: 557ft (192m at 0.50m/s avg) 10 meter walk test: 0.56 m/s without AD Mini-Best: 10/04/2023: 8/28  PATIENT SURVEYS:  ABC scale 17.5% with pt feeling <40% confident on all of the items, pt feels only 30% confident walking around the house, 10% confident getting in/out of car, and 20% reaching for items  OCULOMOTOR EXAM from  11/16/2023 - performed due to Neurology note reporting concerns of decreased vertical gaze:  Ocular Alignment: normal  Ocular ROM: No Limitations  Spontaneous Nystagmus: absent  Gaze-Induced Nystagmus: absent  Smooth Pursuits: intact, good vertical movements with full range  Horizontal and Vertical Saccades: extra eye movements, slow, and frequently blinks, a few extra eye movements when moving to superior target                                                                                                                               TREATMENT DATE: 01/27/24  PT applied gait belt and 3# AW on BLE at start of session, remained in place throughout remainder of session. CGA for safery provided unless otherwise noted.  Weighted gait with 3# AW x 348ft, pt reports significant fatigue for last 163ft, requiring increased instruction for step length and hip flexion to reduce shuffle on the RLE  Sit<>stand with UE lift overhead 2 x 6  Seated trunk rotation to clap contralateral hand with shoulders at 90 deg abduction  Reciprocal step over cane in floor 2 x 10 with LUE support on the first bout and RUE on the second assist for erect trunk posture and cues for improved step height/length on the RLE with fatigue Staggered Stance with weight shift and arm swing x 15 bil.  Gait without AD x 16ft and 3# AW; cues for posture and UE swing as able to perform as well as improved step length with 180 deg turn.  Gait without resistance to OT gym at end of session of SPC x 18ft. Continued instruction for increased heel contact.   PATIENT EDUCATION: Education details: exercise technique, reassessment  Person educated: Patient Education method: Explanation, demo, vc Education comprehension: verbalized understanding and needs further education  HOME EXERCISE PROGRAM:  Access Code: [email or web address] URL: [email or web address] Date: 12/02/2023 Prepared by: Connell Kiss  Exercises - Sidelying  Thoracic Rotation with Open Book  - 1 x daily - 7 x weekly - 2 sets - 10 reps - Supine Shoulder External Rotation in Abduction  - 1 x daily - 7 x weekly - 2 sets - 10 reps - 10 seconds hold - Seated Scapular Retraction  - 1 x daily - 7 x weekly - 3 sets - 10 reps - 3 seconds hold - Sit to Stand with Arm Swing  - 1 x daily - 7 x weekly - 2 sets - 10 reps   GOALS: Goals reviewed with patient? Yes  SHORT TERM GOALS: Target date: 02/15/2024    Patient will be independent in home exercise program to improve strength/mobility for better  functional independence with ADLs. Baseline: need to initiate 10/07/2023: provided 12/02/2023: updated Goal status: IN PROGRESS   LONG TERM GOALS: Target date: 03/28/2024   1.  Patient (< 26 years old) will complete five times sit to stand test in < 12 seconds indicating an increased LE strength and improved balance. Baseline: 09/28/23: 38.53 seconds 11/09/2023: 22.13 seconds with arms across chest 01/04/2024: 20.12 seconds without UE support Goal status: IN PROGRESS   2. Patient will reduce timed up and go to <11 seconds to reduce fall risk and demonstrate improved transfer/gait ability. Baseline: 09/28/23: 19.46 seconds without AD 11/09/2023: 16.96 seconds, no AD, with close SBA for balance safety 01/04/2024: 15.845 seconds, no AD with close SBA for safety Goal status: IN PROGRESS  3.  Patient will increase 10 meter walk test to >1.64m/s as to improve gait speed for better community ambulation and to reduce fall risk. Baseline: 09/28/23: 0.75m/s without AD 11/09/2023: 0.89 m/s (11.50 seconds seconds 10.94 seconds) no AD, with close SBA for safety 01/04/2024: 0.935 m/s without AD, CGA/close SBA for safety Goal status: IN PROGRESS  4.  Patient will increase six minute walk test distance to >1096ft for progression to community ambulator and improve gait ability Baseline: 10/04/2023: 546ft (172m at 0.48m/s avg) 11/09/2023: 1061 feet (323 meters, Avg speed 0.897 m/s) without  AD Goal status: MET and Advanced 11/09/2023 Goal Updated: distance > 1343ft 01/13/2024: 832  ft mostly without SPC but then pt needed SPC due to fatigue, required one break (standing)   5. Patient will increase MiniBest Test score to >18/28 to indicate a reduced risk for falling and demonstrate increased independence with functional mobility and ADLs.  Baseline: 10/04/2023: 8/28  11/16/2023: 16/28  01/22/2024: deferred 6/17: 20/28  Goal status: MET  6. Patient will increase ABC scale score >80% to demonstrate better functional mobility and better confidence with ADLs.   Baseline: 09/28/23: 17.5% 11/09/2023: 21.25% - pt reports he rated the test as is if he was not using his walker because this is his long term goal  01/04/2024: 35.625% - continuing to rate it as if he is not using his walker   Goal status: IN PROGRESS   ASSESSMENT:  CLINICAL IMPRESSION:   Patient arrived with good motivation for completion of pt activities. Pt demonstrates difficulty with with reciprocal arm swing and heel contact with weighted gait interventions to improve neuromotor recruitment. Was able to demonsrtate increased step length and trunk rotation within session, but required constant instruction from PT. Pt will continue to benefit from skilled physical therapy intervention to address impairments, improve QOL, and attain therapy goals.     OBJECTIVE IMPAIRMENTS: Abnormal gait, decreased activity tolerance, decreased balance, decreased coordination, decreased endurance, decreased knowledge of condition, decreased knowledge of use of DME, decreased mobility, difficulty walking, decreased ROM, decreased strength, decreased safety awareness, improper body mechanics, postural dysfunction, and pain.   ACTIVITY LIMITATIONS: carrying, lifting, bending, standing, squatting, sleeping, stairs, transfers, bed mobility, continence, bathing, toileting, dressing, reach over head, hygiene/grooming, and locomotion  level  PARTICIPATION LIMITATIONS: meal prep, cleaning, laundry, and community activity  PERSONAL FACTORS: Fitness and 3+ comorbidities: Vitamin D deficiency, Hyperlipidemia, and HTN are also affecting patient's functional outcome.   REHAB POTENTIAL: Good  CLINICAL DECISION MAKING: Evolving/moderate complexity  EVALUATION COMPLEXITY: Moderate  PLAN:  PT FREQUENCY: 1-2x/week  PT DURATION: 12 weeks  PLANNED INTERVENTIONS: 97164- PT Re-evaluation, 97110-Therapeutic exercises, 97530- Therapeutic activity, [id number]- Neuromuscular re-education, 97535- Self Care, [id number]- Manual therapy, [id number]- Gait training, [phone]- Orthotic Fit/training, [phone]- Canalith repositioning,  02967- Electrical stimulation (manual), Patient/Family education, Balance training, Stair training, Joint mobilization, Vestibular training, Visual/preceptual remediation/compensation, DME instructions, Cryotherapy, and Moist heat  PLAN FOR NEXT SESSION:  **focus on functional mobility tasks patient reports as challenging** - gait training with increased intensity - dynamic gait with focus on increased upright posture and speed of ambulation with arm swing (add AWs to UEs with boxing gloves)  - increased focus on turns  - continue arm reaching and trunk rotation (specifically to R) - stepping strategy re-training: focus on posterior stepping - large amplitude UE movements with promotion of improved upright posture    Massie FORBES Dollar PT ,DPT Physical Therapist- [facility]  [facility]   8:54 AM 01/27/24

## 2024-02-01 ENCOUNTER — Ambulatory Visit: Admitting: Physical Therapy

## 2024-02-01 ENCOUNTER — Ambulatory Visit: Attending: Family Medicine | Admitting: Occupational Therapy

## 2024-02-01 DIAGNOSIS — R278 Other lack of coordination: Secondary | ICD-10-CM | POA: Diagnosis present

## 2024-02-01 DIAGNOSIS — R2681 Unsteadiness on feet: Secondary | ICD-10-CM | POA: Insufficient documentation

## 2024-02-01 DIAGNOSIS — M6281 Muscle weakness (generalized): Secondary | ICD-10-CM | POA: Insufficient documentation

## 2024-02-01 DIAGNOSIS — R269 Unspecified abnormalities of gait and mobility: Secondary | ICD-10-CM

## 2024-02-01 DIAGNOSIS — R262 Difficulty in walking, not elsewhere classified: Secondary | ICD-10-CM | POA: Diagnosis present

## 2024-02-01 DIAGNOSIS — R531 Weakness: Secondary | ICD-10-CM | POA: Diagnosis present

## 2024-02-01 NOTE — Therapy (Signed)
 OUTPATIENT PHYSICAL THERAPY NEURO TREATMENT     Patient Name: Daniel Daugherty MRN: [id number] DOB:Jul 07, 1966, 58 y.o., male Today's Date: 02/01/2024   PCP: Odell Chard, Edra GRADE, MD  REFERRING PROVIDER: Odell Chard, Edra GRADE, MD   END OF SESSION:    PT End of Session - 02/01/24 1506     Visit Number 24    Number of Visits 43   Corrected   Date for PT Re-Evaluation 03/28/24    Authorization Type Medicare 2025    Progress Note Due on Visit 30    PT Start Time 1145    PT Stop Time 1226    PT Time Calculation (min) 41 min    Equipment Utilized During Treatment Gait belt    Activity Tolerance Patient tolerated treatment well    Behavior During Therapy WFL for tasks assessed/performed             Past Medical History:  Diagnosis Date   Hypertension    Past Surgical History:  Procedure Laterality Date   DG HAND LEFT COMPLETE ([facility] HX) Left 04/13/1999   There are no active problems to display for this patient.   ONSET DATE: 2022 is when he noticed the change and that was around the same time he was diagnosed with Parkinson's  REFERRING DIAG:  G20.A1 (ICD-10-CM) - Parkinson disease (HCC)  Z74.1 (ICD-10-CM) - Requires daily assistance for activities of daily living (ADL) and comfort needs   THERAPY DIAG:  Muscle weakness (generalized)  Difficulty in walking, not elsewhere classified  Unsteadiness on feet  Abnormality of gait  Rationale for Evaluation and Treatment: Rehabilitation  SUBJECTIVE:                                                                                                                                                                                             SUBJECTIVE STATEMENT: TODAY 02/01/24: Pt reports no falls or LOB since last session. Had a good weekend and got to watch his grand kids play some basketball.     PREVIOUSLY-  Pt reports he has been doing alright.  States his son turned 34y.o. this past Sunday and they had a  cookout to celebrate. Pt reports he has gotten lazy since last session, not performing his HEP, and has been kicking back. However, later states he has been encouraging himself to stay active and walk every day using his walker as needed  Pt reports he still hasn't called Neurology office to set-up an appointment. Pt reports he stopped coming to therapy because he felt like he wasn't getting any better and it was discouraging. Reports he doesn't feel safe  trying to do the things at home that he does at therapy. Therapist educated pt that he is only to do the prescribed HEP at home and that the focus of therapy sessions is to challenge him to perform tasks that he is not safe to perform without a skilled physical therapist in order to advance his mobility.  Pt states he is ready to get better and he is frustrated with his CLOF. Pt reports he has looked up some things on social media for support and to find ideas on how to move more safely and independently. Pt sates the idea of the [employer] group is intimidating to him and he is concerned he will get frustrated by his inability to do as well as other people in the group.   Pt sates he is trying to get dressed faster, but when he times himself he hasn't increased his speed. Pt states when he gets flustered and is trying to move fast, then his tremor increase and he has to stop and relax to get them to calm back down.  Pt states he knows he needs to keep coming to therapy, but he isn't improving as fast as he wants to. Pt states he doesn't want to keep missing out on life events because of his impairments. But he can't tell how the improvements he is making in therapy are improving his every day life. Despite this, pt in agreement to complete re-certification at this time to allow him to continue with therapy because he wants to get better. Therapist educated pt on goal to focus more on specific functional mobility tasks he is having difficulty with  at home.  Denies stumbles/falls since last session.     Pt accompanied by: self  PERTINENT HISTORY: Parkinsonism, Vitamin D deficiency, Hyperlipidemia, HTN, poor medication compliance (per chart review)  PAIN:  Are you having pain? Yes: NPRS scale: 5/10, but states it ain't that bad Pain location: R shoulder and low back Pain description: throbbing in shoulder Aggravating factors: overhead movements or reaching back Relieving factors: rest and at home uses a massager  PRECAUTIONS: Fall  RED FLAGS: None   WEIGHT BEARING RESTRICTIONS: No  FALLS: Has patient fallen in last 6 months? Yes. 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   PATIENT GOALS: Get my balance right with my walking, help get my R arm stronger and be able to move it more, improve strength in my legs (specifically the Right)  OBJECTIVE:  Note: Objective measures were completed at Evaluation unless otherwise noted.  DIAGNOSTIC FINDINGS: N/A  COGNITION: Overall cognitive status: Within functional limits for tasks assessed; however, did not assess higher level cognitive tasks   SENSATION: WFL Light touch on screen Pt reports R foot falls asleep sitting on toilet and he has difficult time getting feeling back in it before feeling safe standing up.  COORDINATION: Overall bradykinesia with decreased speed and amplitude of  movements  EDEMA:  None present  MUSCLE TONE:  Not formally assessed  MUSCLE LENGTH: Not formally assessed, but may have some hamstring tightness on R associated with quad weakness and inability to achieve full knee extension in sitting  DTRs:  Not assessed  POSTURE: rounded shoulders, forward head, increased thoracic kyphosis, posterior pelvic tilt, and flexed trunk    LOWER EXTREMITY ROM:     Active ROM Right Eval Left Eval  Hip flexion Decreased in sitting compared to L   Hip extension    Hip abduction    Hip adduction    Hip internal rotation    Hip external rotation    Knee flexion    Knee extension Lacks full knee extension in sitting   Ankle dorsiflexion Limited in sitting Limited in sitting  Ankle plantarflexion    Ankle inversion    Ankle eversion     (Blank rows = not tested)  LOWER EXTREMITY MMT:    MMT Right Eval Left Eval  Hip flexion 3- 4+  Hip extension    Hip abduction    Hip adduction    Hip internal rotation    Hip external rotation    Knee flexion 3 4+  Knee extension 3- 4+  Ankle dorsiflexion 3- 4+  Ankle plantarflexion 3- 4+  Ankle inversion    Ankle eversion    (Blank rows = not tested)  Manual Muscle Test Scale 0/5 = No muscle contraction can be seen or felt 1/5 = Contraction can be felt, but there is no motion 2-/5 = Part moves through incomplete ROM w/ gravity decreased 2/5 = Part moves through complete ROM w/ gravity decreased 2+/5 = Part moves through incomplete ROM (<50%) against gravity or through complete ROM w/ gravity 3-/5 = Part moves through incomplete ROM (>50%) against gravity 3/5 = Part moves through complete ROM against gravity 3+/5 = Part moves through complete ROM against gravity/slight resistance 4-/5= Holds test position against slight to moderate pressure 4/5 = Part moves through complete ROM against gravity/moderate resistance 4+/5= Holds test position against moderate to strong pressure 5/5 = Part moves  through complete ROM against gravity/full resistance  BED MOBILITY:  Sit to supine   Supine to sit   Need to assess  ADL: Reports dificulty getitng on R shoe, specifically having trouble pushing his foot down in his shoe  TRANSFERS: Assistive device utilized: Hurry cane  Sit to stand: Modified independence and SBA Stand to sit: Modified independence and SBA Chair to chair: Modified independence and SBA Floor: would benefit from testing in future  RAMP:  Level of Assistance:   Assistive device utilized: Hurry cane Ramp Comments: would benefit from assessing  CURB:  Level of Assistance:   Assistive device utilized: [employer] Comments: would benefit from assessing  STAIRS: Level of Assistance: CGA and Min A Stair Negotiation Technique: Step to Pattern Forwards with Bilateral Rails Number of Stairs: 4  Height of Stairs: 6in  Comments: leading with R LE in both directions, attempts reciprocal pattern on ascent with sufficient strength, but having uncontrolled anterior lean during descent  GAIT: Gait pattern: step through pattern, decreased arm swing- Right, decreased arm swing- Left, decreased step length- Right, decreased step length- Left, decreased stride length, decreased hip/knee flexion- Right, decreased hip/knee flexion- Left, decreased ankle dorsiflexion- Right, decreased ankle dorsiflexion- Left, decreased trunk rotation, trunk flexed, poor foot clearance- Right, and poor foot clearance- Left Distance walked: 125ft Assistive device utilized: None Level of assistance: CGA Comments: significantly decreased  FUNCTIONAL TESTS:  5 times sit to stand: 38.53 seconds  arms across chest Timed up and go (TUG): 19.46 seconds without AD 6 minute walk test: 10/04/2023: 530ft (1110m at 0.10m/s avg) 10 meter walk test: 0.56 m/s without AD Mini-Best: 10/04/2023: 8/28  PATIENT SURVEYS:  ABC scale 17.5% with pt feeling <40% confident on all of the items, pt feels only 30% confident  walking around the house, 10% confident getting in/out of car, and 20% reaching for items  OCULOMOTOR EXAM from 11/16/2023 - performed due to Neurology note reporting concerns of decreased vertical gaze:  Ocular Alignment: normal  Ocular ROM: No Limitations  Spontaneous Nystagmus: absent  Gaze-Induced Nystagmus: absent  Smooth Pursuits: intact, good vertical movements with full range  Horizontal and Vertical Saccades: extra eye movements, slow, and frequently blinks, a few extra eye movements when moving to superior target                                                                                                                               TREATMENT DATE: 02/01/24  NMR: To facilitate reeducation of movement, balance, posture, coordination, and/or proprioception/kinesthetic sense.  Octane fitness trainer  Level 1 x 6 min for large B UE / LE reciprocal movement training.   STS with PWR! Up modification 2 x 10   Side step with 90 degree turn x 4 rounds   Modified all 4s PWR! Twist in standing leanding on support rail, min shoulder movement with focus on trunk rotation x 10nea   Gait training:   Gait with 2# AW cues for step length and posture, worsened foot drag with fatigue 2 x 320 ft with seated rest between   Gait in // bars with intermittent UE support x 10 times through with 2# AW donned.   - stepping onver 1/2 foam rollers in step over step pattern    PATIENT EDUCATION: Education details: exercise technique, reassessment  Person educated: Patient Education method: Explanation, demo, vc Education comprehension: verbalized understanding and needs further education  HOME EXERCISE PROGRAM:  Access Code: [email or web address] URL: [email or web address] Date: 12/02/2023 Prepared by: Connell Kiss  Exercises - Sidelying Thoracic Rotation with Open Book  - 1 x daily - 7 x weekly - 2 sets - 10 reps - Supine Shoulder External Rotation in Abduction  - 1 x daily - 7 x  weekly - 2 sets - 10 reps - 10 seconds hold - Seated Scapular Retraction  - 1 x daily - 7 x weekly - 3 sets - 10 reps - 3 seconds hold - Sit to Stand with Arm Swing  - 1 x daily - 7 x weekly - 2 sets - 10 reps   GOALS: Goals reviewed with patient? Yes  SHORT TERM GOALS: Target date: 02/15/2024    Patient will be independent in home exercise program to improve strength/mobility for better functional independence with ADLs. Baseline: need to initiate 10/07/2023: provided 12/02/2023: updated Goal status: IN PROGRESS   LONG TERM GOALS: Target date: 03/28/2024   1.  Patient (< 79 years old) will complete five times sit to stand test in < 12 seconds indicating an increased LE strength and improved balance. Baseline: 09/28/23: 38.53 seconds 11/09/2023: 22.13 seconds with arms across chest 01/04/2024: 20.12 seconds without UE support Goal status: IN PROGRESS   2. Patient will reduce timed up and go to <11 seconds to reduce fall risk and demonstrate improved transfer/gait ability. Baseline: 09/28/23: 19.46 seconds without AD 11/09/2023: 16.96 seconds, no AD, with close SBA for balance safety 01/04/2024: 15.845 seconds, no AD with close SBA for safety Goal status: IN PROGRESS  3.  Patient will increase 10 meter walk test to >1.65m/s as to improve gait speed for better community ambulation and to reduce fall risk. Baseline: 09/28/23: 0.29m/s without AD 11/09/2023: 0.89 m/s (11.50 seconds seconds 10.94 seconds) no AD, with close SBA for safety 01/04/2024: 0.935 m/s without AD, CGA/close SBA for safety Goal status: IN PROGRESS  4.  Patient will increase six minute walk test distance to >1018ft for progression to community ambulator and improve gait ability Baseline: 10/04/2023: 515ft (127m at 0.62m/s avg) 11/09/2023: 1061 feet (323 meters, Avg speed 0.897 m/s) without AD Goal status: MET and Advanced 11/09/2023 Goal Updated: distance > 1315ft 01/13/2024: 832  ft mostly without SPC but then pt needed SPC due to  fatigue, required one break (standing)   5. Patient will increase MiniBest Test score to >18/28 to indicate a reduced risk for falling and demonstrate increased independence with functional mobility and ADLs.  Baseline: 10/04/2023: 8/28  11/16/2023: 16/28  01/22/2024: deferred 6/17: 20/28  Goal status: MET  6. Patient will increase ABC scale score >80% to demonstrate better functional mobility and better confidence with ADLs.   Baseline: 09/28/23: 17.5% 11/09/2023: 21.25% - pt reports he rated the test as is if he was not using his walker because this is his long term goal  01/04/2024: 35.625% - continuing to rate it as if he is not using his walker   Goal status: IN PROGRESS   ASSESSMENT:  CLINICAL IMPRESSION:   Patient arrived with good motivation for completion of pt activities. Pt showing good lateral weight shifting and stepping but has difficulty with turns. Pt able to get improved foot clearance when stepping over 1/2 foam rollers this date and had some carryover immediately after. With cues and instruction pt shows improved efficacy with interventions and notes no shoulder pain or discomfort. Pt will continue to benefit from skilled physical therapy intervention to address impairments, improve QOL, and attain therapy goals.     OBJECTIVE IMPAIRMENTS: Abnormal gait, decreased activity tolerance, decreased balance, decreased coordination, decreased endurance, decreased knowledge of condition, decreased knowledge of use of DME, decreased mobility, difficulty walking, decreased ROM, decreased strength, decreased safety awareness, improper body mechanics, postural dysfunction, and pain.   ACTIVITY LIMITATIONS: carrying, lifting, bending, standing, squatting, sleeping, stairs, transfers, bed mobility, continence, bathing, toileting, dressing, reach over head, hygiene/grooming, and locomotion level  PARTICIPATION LIMITATIONS: meal prep, cleaning, laundry, and community activity  PERSONAL  FACTORS: Fitness and 3+ comorbidities: Vitamin D deficiency, Hyperlipidemia, and HTN are also affecting patient's functional outcome.   REHAB POTENTIAL: Good  CLINICAL DECISION MAKING: Evolving/moderate complexity  EVALUATION COMPLEXITY: Moderate  PLAN:  PT FREQUENCY: 1-2x/week  PT DURATION: 12 weeks  PLANNED INTERVENTIONS: 97164- PT Re-evaluation, 97110-Therapeutic exercises, 97530- Therapeutic activity, 97112- Neuromuscular re-education, 97535- Self Care, [id number]- Manual therapy, [phone]- Gait training, [phone]- Orthotic Fit/training, [phone]- Canalith repositioning, [phone]- Electrical stimulation (manual), Patient/Family education, Balance training, Stair training, Joint mobilization, Vestibular training,  Visual/preceptual remediation/compensation, DME instructions, Cryotherapy, and Moist heat  PLAN FOR NEXT SESSION:  **focus on functional mobility tasks patient reports as challenging** - gait training with increased intensity - dynamic gait with focus on increased upright posture and speed of ambulation with arm swing (add AWs to UEs with boxing gloves)  - increased focus on turns  - continue arm reaching and trunk rotation (specifically to R) - stepping strategy re-training: focus on posterior stepping - large amplitude UE movements with promotion of improved upright posture    Lonni KATHEE Gainer PT ,DPT Physical Therapist- [facility]  [facility]   3:06 PM 02/01/24

## 2024-02-01 NOTE — Therapy (Signed)
 Occupational Therapy Treatment Note   Patient Name: Daniel Daugherty MRN: [id number] DOB:04-17-1966, 58 y.o., male Today's Date: 02/01/2024  PCP: N/A REFERRING PROVIDER: Odell Tor Edra CINDERELLA, MD  END OF SESSION:  OT End of Session - 02/01/24 1152     Visit Number 24    Number of Visits 48    Date for OT Re-Evaluation 03/28/24    OT Start Time 1100    OT Stop Time 1145    OT Time Calculation (min) 45 min    Activity Tolerance Patient tolerated treatment well    Behavior During Therapy WFL for tasks assessed/performed               Past Medical History:  Diagnosis Date   Hypertension    Past Surgical History:  Procedure Laterality Date   DG HAND LEFT COMPLETE ([facility] HX) Left 04/13/1999   There are no active problems to display for this patient.   ONSET DATE: 05/2021  REFERRING DIAG: Parkinson's Disease  THERAPY DIAG:  Muscle weakness (generalized)  Rationale for Evaluation and Treatment: Rehabilitation  SUBJECTIVE:   SUBJECTIVE STATEMENT:  Pt. reports doing okay today Pt accompanied by: self  PERTINENT HISTORY:   Pt. is a 58 y.o. male who was diagnosed with Parkinson's Disease in 2022. Pt. reports having had a progressive decline in function since having hernia surgery, and right shoulder rotator cuff surgery. PMHx included: HTN. Of note; Pt. was being followed by [facility], and receiving Home health therapy services. Pt. changed physicians, and was referred for outpatient rehab services here.  PRECAUTIONS: None  WEIGHT BEARING RESTRICTIONS: No  PAIN:  Are you having pain? 2/10  FALLS: Has patient fallen in last 6 months? No, 1 small slip near the bed tripped over shoes  LIVING ENVIRONMENT: Lives with: lives alone Lives in: [place],  Stairs: Entry level Has following equipment at home: Quad cane small base, rollator, shower chair, grab bars, hand rail at [employer]  PLOF: Independent  PATIENT GOALS: Improve self-care  OBJECTIVE:  Note: Objective  measures were completed at Evaluation unless otherwise noted.  HAND DOMINANCE: Right  ADLs:  Assist at home:  Ex wife, and children assist several times a week  Transfers/ambulation related to ADLs: Modified Independent Eating: Difficulty using dominant right hand. Drops food when scooping, Both hands for stabilizing drinks, and uses a straw. Unable to cut food.  Grooming: Increased time required for all grooming tasks. UB Dressing: Difficulty managing buttons LB Dressing: Less assist with pants with elastic, more assist with dress pants Toileting: Increased time for hygiene. Bathing: Requires increased time to complete Tub Shower transfers: Modified Independence with increased time  IADLs: Shopping: Son assists with grocery shopping Light housekeeping: Son's assist. Pt. Does light rising off of dishes. Meal Prep: Increased time. Independent with microwave, and air fryer. Difficulty opening bottles, containers Community mobility:  Transportation, light driving to grocery store Medication management:  Son's set-up weekly pillbox, Pt. Is responsible for taking the medications Financial management: Son's assist with monthly finances Handwriting: 50% legible Hobbies: Dancing, watching sports Career/work: Restaurant manager, fast food  MOBILITY STATUS: Uses a cane; shuffling gait  FUNCTIONAL OUTCOME MEASURES:  Mam-20 sum score: 53/80, MAM Measure score: 52.4   UPPER EXTREMITY ROM:    Active ROM Right eval Right 11/09/23 01/04/24 Left Eval [facility] overall  Shoulder flexion WFL 120(134) 873(862)   Shoulder abduction 52(100) 60(110) 31(881)   Shoulder adduction      Shoulder extension      Shoulder internal rotation  Shoulder external rotation      Elbow flexion [facility] WFL   Elbow extension [facility] WFL   Wrist flexion      Wrist extension [facility] WFL   Wrist ulnar deviation      Wrist radial deviation      Wrist pronation      Wrist supination      (Blank rows = not  tested)  UPPER EXTREMITY MMT:     MMT Right eval Right 11/09/23 Right 01/04/24 Left Eval 4-/5 overall Left 11/09/23 4/5 Overall  Shoulder flexion 3/5 3-/5 3-/5 3-/5   Shoulder abduction 2/5 2/5 2+/5 2+/5   Shoulder adduction       Shoulder extension       Shoulder internal rotation       Shoulder external rotation       Middle trapezius       Lower trapezius       Elbow flexion 3/5 3+/5 3+/5 3+/5   Elbow extension 3/5 3+5 4-/5 4-/5   Wrist flexion       Wrist extension 3/5 3/5 3/5 3/5   Wrist ulnar deviation       Wrist radial deviation       Wrist pronation       Wrist supination       (Blank rows = not tested)  HAND FUNCTION:   Eval: Grip strength: Right: 50 lbs; Left: 34 lbs, Lateral pinch: Right: 17 lbs, Left: 9 lbs, 3 point pinch: Right: 12 lbs, Left: 11 lbs  11/09/23: Grip strength: Right: 60 lbs; Left: 35 lbs, Lateral pinch: Right: 19 lbs, Left: 11 lbs, 3 point pinch: Right: 13 lbs, Left: 9 lbs  01/04/24:Grip strength: Right: 40 lbs; Left: 41 lbs, Lateral pinch: Right: 17 lbs, Left: 8 lbs, 3 point pinch: Right: 9 lbs, Left: 10 lbs  COORDINATION:  Eval: 9 Hole Peg test: Right: 1 min. & 46 sec; Left: 53 sec  11/09/2023: 9 Hole Peg test: Right: 1 min. & 22 sec; Left: 46 sec  01/04/24: 9 Hole Peg test: Right: 1 min. & 11 sec; Left: 46 sec   SENSATION:  Light touch: WFL Proprioception: WFL  EDEMA: N/A  COGNITION: Overall cognitive status: Within functional limits for tasks assessed  VISION:  Does not wear glasses.  VISION ASSESSMENT: To be further assessed in functional context  PERCEPTION: WFL  PRAXIS: Impaired: Motor planning                                                                                                                         TREATMENT DATE: 02/01/24  Therapeutic Ex:   -UE strengthening through reciprocal motion using the SciFit for 10 min. at resistance level 4.0 with constant monitoring of the BUEs. -Pectoral stretches were  performed in standing at the wall, followed by seated in a chair. -Education was provided about home pectoral stretches in standing at the wall.      Manual Therapy:   -Soft tissue  massage was performed to the scapular, and UE musculature 2/2 tightness in conjunction with moist heat modality. -Pt. tolerated scapular mobilization for elevation, depression, and abduction/rotation in sitting to normalize tone, decrease tightness, and prepare the RUE for ROM, strengthening, and functional use.   -Manual therapy was performed independent of, and in preparation for therapeutic Ex.    PATIENT EDUCATION: Education details: UE ROM, ADL functional status Person educated: Patient Education method: Explanation, Demonstration, Tactile cues, and Verbal cues Education comprehension: needs further education  HOME EXERCISE PROGRAM:  Continue to assess, and provide as indicated.   GOALS: Goals reviewed with patient? Yes  SHORT TERM GOALS: Target date: 02/15/2024     Pt. Will be independent with HEPs for RUE. Baseline: 01/04/24: Independent 11/09/2023: Independent Eval: No current HEP Goal status:Ongoing  LONG TERM GOALS: Target date: 03/28/2024  1.  Pt. Will increase right shoulder abduction by 10 degrees to assist with UE dressing Baseline: 01/04/24: 31(881) Pt. reports having donning his shirt is easier with his new routine 11/09/23: Right 60/(110) Eval: Right 52(100) Goal status: Ongoing  2.  Pt. Will increase BUE strength by 2 mm grades to assist with ADLs. Baseline: 01/04/24:  Right: shoulder flexion: 3-/5, abduction: 2/5, elbow flexion 4-/5, extension: 3+/5, wrist extension: 3/5. Left: 4/5 overallEval: Right: shoulder flexion: 3/5, abduction: 2/5, elbow flexion/extension: 3/5, wrist extension: 3/5. Left: 4-/5 overall4/08/25: Right: shoulder flexion: 3-/5, abduction: 2/5, elbow flexion/extension: 3+/5, wrist extension: 3/5. Left: 4/5 overallEval: Right: shoulder flexion: 3/5, abduction: 2/5, elbow  flexion/extension: 3/5, wrist extension: 3/5. Left: 4-/5 overall Goal status: Ongoing  3.  Pt. Will perform self-feeding with modified independence Baseline: 01/04/24: Pt. reports making improvements with self-feeding. Pt. has difficulty at times with bringing his spoon to his mouth, often bringing an empty spoon to his mouth 11/09/23: Pt. Continues to have difficulty using dominant right hand. Drops food when scooping, Both hands for stabilizing drinks, and uses a straw. Unable to cut food. Eval: Difficulty using dominant right hand. Drops food when scooping, Both hands for stabilizing drinks, and uses a straw. Unable to cut food. Goal status: Ongoing  4.  Pt. Will demonstrate independence with proper A/E techniques/compensatory strategies for ADL/IADLs.  Baseline: 01/04/24: Continue ongoing as needed. 11/09/23: Continue Eval: Education to be provided Goal status: Ongoing  5.  Pt. Will write a sentence with 75% legibility with modified independence Baseline: 01/04/24:  One sentence completed in 2 min. & 51 sec. With 50% legibility.11/09/2023: 50% legibility for one sentence. Eval: 50% legibility for one printed sentence Goal status: Ongoing   6.  Pt. Will improve bilateral [facility] skills by 3 sec. on the 9 Hole Peg Test to be able to manipulate small objects.  Baseline: 9 Hole Peg Test: R: 1 min. & 11 sec.  L: 46 sec. 11/09/2023: 9 Hole Peg test: Right: 1 min. & 22 sec; Left: 46 sec Eval: 9 Hole Peg test: Right: 1 min. & 46 sec; Left: 53 sec Goal status:New  7. Pt. Will improve the Mam-20 score by 5 points to reflect ADL/IADL improvement. Baseline: Mam-20 sum score: 53/80, Mam Measure score: 52.4 Goal status New   ASSESSMENT:  CLINICAL IMPRESSION:  Pt. presents with tone in the right scapular, and shoulder musculature. Pt. continues to respond well to pectoral stretches, manual therapy, and UE strengthening on the SciFit. Pt. required support proximally for AAROM on each of the inclines. Pt.  continues to report neck stiffness, and tightness at the shoulder, and scapular musculature. Pt. continues to benefit from OT  services to work on improving overall bilateral UE functioning in order to maximize engagement in, and efficiency in ADLs, and IADL tasks, and provide education about compensatory strategies.    PERFORMANCE DEFICITS: in functional skills including ADLs, IADLs, coordination, dexterity, proprioception, sensation, ROM, strength, pain, Fine motor control, Gross motor control, and UE functional use, cognitive skills including , and psychosocial skills including coping strategies, environmental adaptation, and routines and behaviors.   IMPAIRMENTS: are limiting patient from ADLs, IADLs, and leisure.   CO-MORBIDITIES: may have co-morbidities  that affects occupational performance. Patient will benefit from skilled OT to address above impairments and improve overall function.  MODIFICATION OR ASSISTANCE TO COMPLETE EVALUATION: Min-Moderate modification of tasks or assist with assess necessary to complete an evaluation.  OT OCCUPATIONAL PROFILE AND HISTORY: Detailed assessment: Review of records and additional review of physical, cognitive, psychosocial history related to current functional performance.  CLINICAL DECISION MAKING: Moderate - several treatment options, min-mod task modification necessary  REHAB POTENTIAL: Good  EVALUATION COMPLEXITY: Moderate    PLAN:  OT FREQUENCY: 2x/week  OT DURATION: 12 weeks  PLANNED INTERVENTIONS: 97168 OT Re-evaluation, 97535 self care/ADL training, 02889 therapeutic exercise, 97530 therapeutic activity, 97112 neuromuscular re-education, 97140 manual therapy, 97018 paraffin, 02989 moist heat, 97034 contrast bath, 97760 Orthotics management and training, 02239 Splinting (initial encounter), energy conservation, patient/family education, and DME and/or AE instructions  RECOMMENDED OTHER SERVICES:   PT  CONSULTED AND AGREED WITH PLAN  OF CARE: Patient  PLAN FOR NEXT SESSION:   Treatment  Richardson Otter, MS, OTR/L   02/01/2024, 11:56 AM

## 2024-02-03 ENCOUNTER — Ambulatory Visit: Admitting: Physical Therapy

## 2024-02-03 ENCOUNTER — Ambulatory Visit: Admitting: Occupational Therapy

## 2024-02-03 DIAGNOSIS — R2681 Unsteadiness on feet: Secondary | ICD-10-CM

## 2024-02-03 DIAGNOSIS — M6281 Muscle weakness (generalized): Secondary | ICD-10-CM | POA: Diagnosis not present

## 2024-02-03 DIAGNOSIS — R262 Difficulty in walking, not elsewhere classified: Secondary | ICD-10-CM

## 2024-02-03 DIAGNOSIS — R531 Weakness: Secondary | ICD-10-CM

## 2024-02-03 DIAGNOSIS — R278 Other lack of coordination: Secondary | ICD-10-CM

## 2024-02-03 DIAGNOSIS — R269 Unspecified abnormalities of gait and mobility: Secondary | ICD-10-CM

## 2024-02-03 NOTE — Therapy (Signed)
 OUTPATIENT PHYSICAL THERAPY NEURO TREATMENT     Patient Name: Daniel Daugherty MRN: [id number] DOB:[birth year], 58 y.o., male Today's Date: 02/03/2024   PCP: Odell Chard, Edra GRADE, MD  REFERRING PROVIDER: Odell Chard, Edra GRADE, MD   END OF SESSION:    PT End of Session - 02/03/24 1452     Visit Number 25    Number of Visits 43   Corrected   Date for PT Re-Evaluation 03/28/24    Authorization Type Medicare 2025    Progress Note Due on Visit 30    PT Start Time 1452    PT Stop Time 1530    PT Time Calculation (min) 38 min    Equipment Utilized During Treatment Gait belt    Activity Tolerance Patient tolerated treatment well    Behavior During Therapy WFL for tasks assessed/performed              Past Medical History:  Diagnosis Date   Hypertension    Past Surgical History:  Procedure Laterality Date   DG HAND LEFT COMPLETE ([facility] HX) Left 04/13/1999   There are no active problems to display for this patient.   ONSET DATE: 2022 is when he noticed the change and that was around the same time he was diagnosed with Parkinson's  REFERRING DIAG:  G20.A1 (ICD-10-CM) - Parkinson disease (HCC)  Z74.1 (ICD-10-CM) - Requires daily assistance for activities of daily living (ADL) and comfort needs   THERAPY DIAG:  Muscle weakness (generalized)  Difficulty in walking, not elsewhere classified  Unsteadiness on feet  Abnormality of gait  Generalized weakness  Other lack of coordination  Rationale for Evaluation and Treatment: Rehabilitation  SUBJECTIVE:                                                                                                                                                                                             SUBJECTIVE STATEMENT: TODAY 02/03/24: Pt reports he is doing good. Reports a little bit of pain in shoulders and neck today due to having to drive to therapy. Rates pain as 5/10 from turning the steering wheel. States he now has  to schedule his transportation 48hrs in advance, which can be hard for him to do. Denies stumbles/falls.    PREVIOUSLY-  Pt reports he has been doing alright.  States his son turned 34y.o. this past Sunday and they had a cookout to celebrate. Pt reports he has gotten lazy since last session, not performing his HEP, and has been kicking back. However, later states he has been encouraging himself to stay active and walk every day using his walker as  needed  Pt reports he still hasn't called Neurology office to set-up an appointment. Pt reports he stopped coming to therapy because he felt like he wasn't getting any better and it was discouraging. Reports he doesn't feel safe trying to do the things at home that he does at therapy. Therapist educated pt that he is only to do the prescribed HEP at home and that the focus of therapy sessions is to challenge him to perform tasks that he is not safe to perform without a skilled physical therapist in order to advance his mobility.  Pt states he is ready to get better and he is frustrated with his CLOF. Pt reports he has looked up some things on social media for support and to find ideas on how to move more safely and independently. Pt sates the idea of the [employer] group is intimidating to him and he is concerned he will get frustrated by his inability to do as well as other people in the group.   Pt sates he is trying to get dressed faster, but when he times himself he hasn't increased his speed. Pt states when he gets flustered and is trying to move fast, then his tremor increase and he has to stop and relax to get them to calm back down.  Pt states he knows he needs to keep coming to therapy, but he isn't improving as fast as he wants to. Pt states he doesn't want to keep missing out on life events because of his impairments. But he can't tell how the improvements he is making in therapy are improving his every day life. Despite this, pt in  agreement to complete re-certification at this time to allow him to continue with therapy because he wants to get better. Therapist educated pt on goal to focus more on specific functional mobility tasks he is having difficulty with at home.  Pt accompanied by: self  PERTINENT HISTORY: Parkinsonism, Vitamin D deficiency, Hyperlipidemia, HTN, poor medication compliance (per chart review)  PAIN:  Are you having pain? Yes: NPRS scale: 5/10, but states it ain't that bad Pain location: R shoulder and low back Pain description: throbbing in shoulder Aggravating factors: overhead movements or reaching back Relieving factors: rest and at home uses a massager  PRECAUTIONS: Fall  RED FLAGS: None   WEIGHT BEARING RESTRICTIONS: No  FALLS: Has patient fallen in last 6 months? Yes. 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   PATIENT GOALS: Get my balance right with my walking, help get my R arm stronger and be able to move it more, improve strength in my legs (specifically the Right)  OBJECTIVE:  Note: Objective measures were completed at Evaluation unless otherwise noted.  DIAGNOSTIC FINDINGS: N/A  COGNITION: Overall cognitive status: Within functional limits for tasks assessed; however, did not assess higher level cognitive tasks   SENSATION: WFL Light touch on screen Pt reports R foot falls asleep sitting on  toilet and he has difficult  time getting feeling back in it before feeling safe standing up.  COORDINATION: Overall bradykinesia with decreased speed and amplitude of movements  EDEMA:  None present  MUSCLE TONE: Not formally assessed  MUSCLE LENGTH: Not formally assessed, but may have some hamstring tightness on R associated with quad weakness and inability to achieve full knee extension in sitting  DTRs:  Not assessed  POSTURE: rounded shoulders, forward head, increased thoracic kyphosis, posterior pelvic tilt, and flexed trunk    LOWER EXTREMITY ROM:     Active ROM Right Eval Left Eval  Hip flexion Decreased in sitting compared to L   Hip extension    Hip abduction    Hip adduction    Hip internal rotation    Hip external rotation    Knee flexion    Knee extension Lacks full knee extension in sitting   Ankle dorsiflexion Limited in sitting Limited in sitting  Ankle plantarflexion    Ankle inversion    Ankle eversion     (Blank rows = not tested)  LOWER EXTREMITY MMT:    MMT Right Eval Left Eval  Hip flexion 3- 4+  Hip extension    Hip abduction    Hip adduction    Hip internal rotation    Hip external rotation    Knee flexion 3 4+  Knee extension 3- 4+  Ankle dorsiflexion 3- 4+  Ankle plantarflexion 3- 4+  Ankle inversion    Ankle eversion    (Blank rows = not tested)  Manual Muscle Test Scale 0/5 = No muscle contraction can be seen or felt 1/5 = Contraction can be felt, but there is no motion 2-/5 = Part moves through incomplete ROM w/ gravity decreased 2/5 = Part moves through complete ROM w/ gravity decreased 2+/5 = Part moves through incomplete ROM (<50%) against gravity or through complete ROM w/ gravity 3-/5 = Part moves through incomplete ROM (>50%) against gravity 3/5 = Part moves through complete ROM against gravity 3+/5 = Part moves through complete ROM against gravity/slight resistance 4-/5= Holds test position against slight to moderate  pressure 4/5 = Part moves through complete ROM against gravity/moderate resistance 4+/5= Holds test position against moderate to strong pressure 5/5 = Part moves through complete ROM against gravity/full resistance  BED MOBILITY:  Sit to supine   Supine to sit   Need to assess  ADL: Reports dificulty getitng on R shoe, specifically having trouble pushing his foot down in his shoe  TRANSFERS: Assistive device utilized: Hurry cane  Sit to stand: Modified independence and SBA Stand to sit: Modified independence and SBA Chair to chair: Modified independence and SBA Floor: would benefit from testing in future  RAMP:  Level of Assistance:   Assistive device utilized: Hurry cane Ramp Comments: would benefit from assessing  CURB:  Level of Assistance:   Assistive device utilized: [employer] Comments: would benefit from assessing  STAIRS: Level of Assistance: CGA and Min A Stair Negotiation Technique: Step to Pattern Forwards with Bilateral Rails Number of Stairs: 4  Height of Stairs: 6in  Comments: leading with R LE in both directions, attempts reciprocal pattern on ascent with sufficient strength, but having uncontrolled anterior lean during descent  GAIT: Gait pattern: step through pattern, decreased arm swing- Right, decreased arm swing- Left, decreased step length- Right, decreased step length- Left, decreased stride length, decreased hip/knee flexion- Right, decreased hip/knee flexion- Left, decreased ankle dorsiflexion- Right, decreased ankle dorsiflexion- Left, decreased trunk rotation, trunk flexed, poor foot clearance- Right,  and poor foot clearance- Left Distance walked: 134ft Assistive device utilized: None Level of assistance: CGA Comments: significantly decreased  FUNCTIONAL TESTS:  5 times sit to stand: 38.53 seconds arms across chest Timed up and go (TUG): 19.46 seconds without AD 6 minute walk test: 10/04/2023: 557ft (13m at 0.94m/s avg) 10 meter walk test:  0.56 m/s without AD Mini-Best: 10/04/2023: 8/28  PATIENT SURVEYS:  ABC scale 17.5% with pt feeling <40% confident on all of the items, pt feels only 30% confident walking around the house, 10% confident getting in/out of car, and 20% reaching for items  OCULOMOTOR EXAM from 11/16/2023 - performed due to Neurology note reporting concerns of decreased vertical gaze:  Ocular Alignment: normal  Ocular ROM: No Limitations  Spontaneous Nystagmus: absent  Gaze-Induced Nystagmus: absent  Smooth Pursuits: intact, good vertical movements with full range  Horizontal and Vertical Saccades: extra eye movements, slow, and frequently blinks, a few extra eye movements when moving to superior target                                                                                                                               TREATMENT DATE: 02/03/24  Pt arrives to clinic ambulating with hurricane - pt continues to have short, shuffled step lengths with excessive thoracic kyphosis, downward gaze, and noted to slightly drag the cane.    Unless otherwise stated, CGA was provided and gait belt donned in order to ensure pt safety throughout session.  B UE and B LE reciprocal movement pattern on Octane for cardiovascular training and B LE functional strengthening against level 1 resistance for 1 minute, increased to level 2 for 5 minutes,  totaling 6 minutes and 0.40 mile, average of 25 SPM - goal to keep SPM >30 with pt benefiting from encouragement to maintain speed throughout with sustained attention on the task  STS from green chair, pushing up with hands-on knees to standing with bilateral shoulder extension for improved upright posture 2 x 10  - therapist providing visual cuing for increased speed when powering up - SBA for safety with only 1x minor LOB due to lack of sufficient anterior trunk lean when coming to stand  Repeated 90 degree chair transfers x5 rounds (there and back) - cuing for pt to complete the  transfers in only 3 steps (only requires 4 steps twice) - no UE support - towards the end, pt having fatigue when rising to stand requiring multiple attempts to achieve upright  Gait training 335ft, no AD, with CGA/light min assist for steadying  - cuing throughout for increased step lengths and foot clearance with difficulty achieving a large improvement - continues to lack arm swing bilaterally - pt states the AWs in prior sessions really challenged him and made his legs tired for the rest of the day   Donned 2# AWs to each LE  Forward/backwards stepping over 1/2 foam roll with L UE support on balance bar X5 reps  per LE  Requires light min A for balance progressed to primarily CGA for safety Continues to have slower and smaller movements with R LE compared to L LE Cuing for increased speed of movement when leading with R LE and improvement noted   Gait training additional ~166ft to OT, no AD, while wearing 2# AWs on each LE  Continued cuing for improved gait mechanics with minimal improvement noted because pt does better with external targets to increase amplitude of movement   PATIENT EDUCATION: Education details: exercise technique, reassessment  Person educated: Patient Education method: Explanation, demo, vc Education comprehension: verbalized understanding and needs further education  HOME EXERCISE PROGRAM:  Access Code: [email or web address] URL: [email or web address] Date: 12/02/2023 Prepared by: Connell Kiss  Exercises - Sidelying Thoracic Rotation with Open Book  - 1 x daily - 7 x weekly - 2 sets - 10 reps - Supine Shoulder External Rotation in Abduction  - 1 x daily - 7 x weekly - 2 sets - 10 reps - 10 seconds hold - Seated Scapular Retraction  - 1 x daily - 7 x weekly - 3 sets - 10 reps - 3 seconds hold - Sit to Stand with Arm Swing  - 1 x daily - 7 x weekly - 2 sets - 10 reps   GOALS: Goals reviewed with patient? Yes  SHORT TERM GOALS: Target date:  02/15/2024    Patient will be independent in home exercise program to improve strength/mobility for better functional independence with ADLs. Baseline: need to initiate 10/07/2023: provided 12/02/2023: updated Goal status: IN PROGRESS   LONG TERM GOALS: Target date: 03/28/2024   1.  Patient (< 19 years old) will complete five times sit to stand test in < 12 seconds indicating an increased LE strength and improved balance. Baseline: 09/28/23: 38.53 seconds 11/09/2023: 22.13 seconds with arms across chest 01/04/2024: 20.12 seconds without UE support Goal status: IN PROGRESS   2. Patient will reduce timed up and go to <11 seconds to reduce fall risk and demonstrate improved transfer/gait ability. Baseline: 09/28/23: 19.46 seconds without AD 11/09/2023: 16.96 seconds, no AD, with close SBA for balance safety 01/04/2024: 15.845 seconds, no AD with close SBA for safety Goal status: IN PROGRESS  3.  Patient will increase 10 meter walk test to >1.38m/s as to improve gait speed for better community ambulation and to reduce fall risk. Baseline: 09/28/23: 0.48m/s without AD 11/09/2023: 0.89 m/s (11.50 seconds seconds 10.94 seconds) no AD, with close SBA for safety 01/04/2024: 0.935 m/s without AD, CGA/close SBA for safety Goal status: IN PROGRESS  4.  Patient will increase six minute walk test distance to >1082ft for progression to community ambulator and improve gait ability Baseline: 10/04/2023: 555ft (150m at 0.54m/s avg) 11/09/2023: 1061 feet (323 meters, Avg speed 0.897 m/s) without AD Goal status: MET and Advanced 11/09/2023 Goal Updated: distance > 1379ft 01/13/2024: 832  ft mostly without SPC but then pt needed SPC due to fatigue, required one break (standing)   5. Patient will increase MiniBest Test score to >18/28 to indicate a reduced risk for falling and demonstrate increased independence with functional mobility and ADLs.  Baseline: 10/04/2023: 8/28  11/16/2023: 16/28  01/22/2024: deferred 6/17:  20/28  Goal status: MET  6. Patient will increase ABC scale score >80% to demonstrate better functional mobility and better confidence with ADLs.   Baseline: 09/28/23: 17.5% 11/09/2023: 21.25% - pt reports he rated the test as is if he was not using his walker because this is his long  term goal  01/04/2024: 35.625% - continuing to rate it as if he is not using his walker   Goal status: IN PROGRESS   ASSESSMENT:  CLINICAL IMPRESSION:  Patient arrived with good motivation for completion of therapy. Therapy session continued to focus on larger amplitude and speed of movements during functional mobility tasks. Patient continues to demo short, shuffled steps with slight anterior weight shift in standing/gait as well as lack of arm swing and increased thoracic kyphosis. Patient continues to demonstrate improvement in ability to power up into standing and perform 90degree stand pivot transfers in only 3 steps consistently. Patient benefits from use of AWs despite them being fatiguing for patient with improving R foot clearance over 1/2 foam roll with repetition.  Pt will continue to benefit from skilled physical therapy intervention to address impairments, improve QOL, and attain therapy goals.     OBJECTIVE IMPAIRMENTS: Abnormal gait, decreased activity tolerance, decreased balance, decreased coordination, decreased endurance, decreased knowledge of condition, decreased knowledge of use of DME, decreased mobility, difficulty walking, decreased ROM, decreased strength, decreased safety awareness, improper body mechanics, postural dysfunction, and pain.   ACTIVITY LIMITATIONS: carrying, lifting, bending, standing, squatting, sleeping, stairs, transfers, bed mobility, continence, bathing, toileting, dressing, reach over head, hygiene/grooming, and locomotion level  PARTICIPATION LIMITATIONS: meal prep, cleaning, laundry, and community activity  PERSONAL FACTORS: Fitness and 3+ comorbidities: Vitamin D  deficiency, Hyperlipidemia, and HTN are also affecting patient's functional outcome.   REHAB POTENTIAL: Good  CLINICAL DECISION MAKING: Evolving/moderate complexity  EVALUATION COMPLEXITY: Moderate  PLAN:  PT FREQUENCY: 1-2x/week  PT DURATION: 12 weeks  PLANNED INTERVENTIONS: 97164- PT Re-evaluation, 97110-Therapeutic exercises, 97530- Therapeutic activity, 97112- Neuromuscular re-education, 97535- Self Care, [id number]- Manual therapy, [phone]- Gait training, [phone]- Orthotic Fit/training, [phone]- Canalith repositioning, [phone]- Electrical stimulation (manual), Patient/Family education, Balance training, Stair training, Joint mobilization, Vestibular training, Visual/preceptual remediation/compensation, DME instructions, Cryotherapy, and Moist heat  PLAN FOR NEXT SESSION:  **focus on functional mobility tasks patient reports as challenging** - gait training with increased intensity - dynamic gait with focus on increased upright posture and speed of ambulation with arm swing (add AWs to UEs with boxing gloves)  - increased focus on turns  - continue arm reaching and trunk rotation (specifically to R) - stepping strategy re-training: focus on posterior stepping - large amplitude UE movements with promotion of improved upright posture    Norinne Jeane, PT, DPT, NCS, CSRS Physical Therapist - [place]  [facility]  3:34 PM 02/03/24

## 2024-02-03 NOTE — Therapy (Addendum)
 Occupational Therapy Treatment Note   Patient Name: Daniel Daugherty MRN: [id number] DOB:[birth year], 58 y.o., male Today's Date: 02/03/2024  PCP: N/A REFERRING PROVIDER: Odell Tor Edra CINDERELLA, MD  END OF SESSION:  OT End of Session - 02/03/24 1734     Visit Number 25    Number of Visits 48    Date for OT Re-Evaluation 03/28/24    OT Start Time 1530    OT Stop Time 1615    OT Time Calculation (min) 45 min    Activity Tolerance Patient tolerated treatment well    Behavior During Therapy WFL for tasks assessed/performed               Past Medical History:  Diagnosis Date   Hypertension    Past Surgical History:  Procedure Laterality Date   DG HAND LEFT COMPLETE ([facility] HX) Left 04/13/1999   There are no active problems to display for this patient.   ONSET DATE: 05/2021  REFERRING DIAG: Parkinson's Disease  THERAPY DIAG:  Muscle weakness (generalized)  Rationale for Evaluation and Treatment: Rehabilitation  SUBJECTIVE:   SUBJECTIVE STATEMENT:  Pt. reports doing okay today Pt accompanied by: self  PERTINENT HISTORY:   Pt. is a 58 y.o. male who was diagnosed with Parkinson's Disease in 2022. Pt. reports having had a progressive decline in function since having hernia surgery, and right shoulder rotator cuff surgery. PMHx included: HTN. Of note; Pt. was being followed by [facility], and receiving Home health therapy services. Pt. changed physicians, and was referred for outpatient rehab services here.  PRECAUTIONS: None  WEIGHT BEARING RESTRICTIONS: No  PAIN:  Are you having pain? 2/10  FALLS: Has patient fallen in last 6 months? No, 1 small slip near the bed tripped over shoes  LIVING ENVIRONMENT: Lives with: lives alone Lives in: [place],  Stairs: Entry level Has following equipment at home: Quad cane small base, rollator, shower chair, grab bars, hand rail at [employer]  PLOF: Independent  PATIENT GOALS: Improve self-care  OBJECTIVE:  Note: Objective  measures were completed at Evaluation unless otherwise noted.  HAND DOMINANCE: Right  ADLs:  Assist at home:  Ex wife, and children assist several times a week  Transfers/ambulation related to ADLs: Modified Independent Eating: Difficulty using dominant right hand. Drops food when scooping, Both hands for stabilizing drinks, and uses a straw. Unable to cut food.  Grooming: Increased time required for all grooming tasks. UB Dressing: Difficulty managing buttons LB Dressing: Less assist with pants with elastic, more assist with dress pants Toileting: Increased time for hygiene. Bathing: Requires increased time to complete Tub Shower transfers: Modified Independence with increased time  IADLs: Shopping: Son assists with grocery shopping Light housekeeping: Son's assist. Pt. Does light rising off of dishes. Meal Prep: Increased time. Independent with microwave, and air fryer. Difficulty opening bottles, containers Community mobility:  Transportation, light driving to grocery store Medication management:  Son's set-up weekly pillbox, Pt. Is responsible for taking the medications Financial management: Son's assist with monthly finances Handwriting: 50% legible Hobbies: Dancing, watching sports Career/work: Restaurant manager, fast food  MOBILITY STATUS: Uses a cane; shuffling gait  FUNCTIONAL OUTCOME MEASURES:  Mam-20 sum score: 53/80, MAM Measure score: 52.4   UPPER EXTREMITY ROM:    Active ROM Right eval Right 11/09/23 01/04/24 Left Eval [facility] overall  Shoulder flexion WFL [phone])   Shoulder abduction 52(100) 60(110) 31(881)   Shoulder adduction      Shoulder extension      Shoulder internal rotation  Shoulder external rotation      Elbow flexion [facility] WFL   Elbow extension [facility] WFL   Wrist flexion      Wrist extension [facility] WFL   Wrist ulnar deviation      Wrist radial deviation      Wrist pronation      Wrist supination      (Blank rows = not  tested)  UPPER EXTREMITY MMT:     MMT Right eval Right 11/09/23 Right 01/04/24 Left Eval 4-/5 overall Left 11/09/23 4/5 Overall  Shoulder flexion 3/5 3-/5 3-/5 3-/5   Shoulder abduction 2/5 2/5 2+/5 2+/5   Shoulder adduction       Shoulder extension       Shoulder internal rotation       Shoulder external rotation       Middle trapezius       Lower trapezius       Elbow flexion 3/5 3+/5 3+/5 3+/5   Elbow extension 3/5 3+5 4-/5 4-/5   Wrist flexion       Wrist extension 3/5 3/5 3/5 3/5   Wrist ulnar deviation       Wrist radial deviation       Wrist pronation       Wrist supination       (Blank rows = not tested)  HAND FUNCTION:   Eval: Grip strength: Right: 50 lbs; Left: 34 lbs, Lateral pinch: Right: 17 lbs, Left: 9 lbs, 3 point pinch: Right: 12 lbs, Left: 11 lbs  11/09/23: Grip strength: Right: 60 lbs; Left: 35 lbs, Lateral pinch: Right: 19 lbs, Left: 11 lbs, 3 point pinch: Right: 13 lbs, Left: 9 lbs  01/04/24:Grip strength: Right: 40 lbs; Left: 41 lbs, Lateral pinch: Right: 17 lbs, Left: 8 lbs, 3 point pinch: Right: 9 lbs, Left: 10 lbs  COORDINATION:  Eval: 9 Hole Peg test: Right: 1 min. & 46 sec; Left: 53 sec  11/09/2023: 9 Hole Peg test: Right: 1 min. & 22 sec; Left: 46 sec  01/04/24: 9 Hole Peg test: Right: 1 min. & 11 sec; Left: 46 sec   SENSATION:  Light touch: WFL Proprioception: WFL  EDEMA: N/A  COGNITION: Overall cognitive status: Within functional limits for tasks assessed  VISION:  Does not wear glasses.  VISION ASSESSMENT: To be further assessed in functional context  PERCEPTION: WFL  PRAXIS: Impaired: Motor planning                                                                                                                         TREATMENT DATE: 02/03/24  Therapeutic Ex:   -UE strengthening through reciprocal motion using the SciFit for 10 min. at resistance level 4.0 with constant monitoring of the BUEs. -Right shoulder AROM/PROM was  performed with slow prolonged gentle stretching. -AAROM was preformed at the tabletop surface for shoulder flexion, and abductio  Manual Therapy:   -Soft tissue massage was performed to the scapular, and UE musculature  2/2 tightness. -Manual therapy was performed independent of, and in preparation for therapeutic Ex.    Therapeutic Activities:   -facilitated RUE functional reaching through multiple planes using 1.5 [place]  style discs. Pt. required support proximally when reaching through the multiple planes to discard the discs into containers.  PATIENT EDUCATION: Education details: UE ROM, ADL functional status Person educated: Patient Education method: Explanation, Demonstration, Tactile cues, and Verbal cues Education comprehension: needs further education  HOME EXERCISE PROGRAM:  Continue to assess, and provide as indicated.   GOALS: Goals reviewed with patient? Yes  SHORT TERM GOALS: Target date: 02/15/2024     Pt. Will be independent with HEPs for RUE. Baseline: 01/04/24: Independent 11/09/2023: Independent Eval: No current HEP Goal status:Ongoing  LONG TERM GOALS: Target date: 03/28/2024  1.  Pt. Will increase right shoulder abduction by 10 degrees to assist with UE dressing Baseline: 01/04/24: 31(881) Pt. reports having donning his shirt is easier with his new routine 11/09/23: Right 60/(110) Eval: Right 52(100) Goal status: Ongoing  2.  Pt. Will increase BUE strength by 2 mm grades to assist with ADLs. Baseline: 01/04/24:  Right: shoulder flexion: 3-/5, abduction: 2/5, elbow flexion 4-/5, extension: 3+/5, wrist extension: 3/5. Left: 4/5 overallEval: Right: shoulder flexion: 3/5, abduction: 2/5, elbow flexion/extension: 3/5, wrist extension: 3/5. Left: 4-/5 overall4/08/25: Right: shoulder flexion: 3-/5, abduction: 2/5, elbow flexion/extension: 3+/5, wrist extension: 3/5. Left: 4/5 overallEval: Right: shoulder flexion: 3/5, abduction: 2/5, elbow flexion/extension: 3/5,  wrist extension: 3/5. Left: 4-/5 overall Goal status: Ongoing  3.  Pt. Will perform self-feeding with modified independence Baseline: 01/04/24: Pt. reports making improvements with self-feeding. Pt. has difficulty at times with bringing his spoon to his mouth, often bringing an empty spoon to his mouth 11/09/23: Pt. Continues to have difficulty using dominant right hand. Drops food when scooping, Both hands for stabilizing drinks, and uses a straw. Unable to cut food. Eval: Difficulty using dominant right hand. Drops food when scooping, Both hands for stabilizing drinks, and uses a straw. Unable to cut food. Goal status: Ongoing  4.  Pt. Will demonstrate independence with proper A/E techniques/compensatory strategies for ADL/IADLs.  Baseline: 01/04/24: Continue ongoing as needed. 11/09/23: Continue Eval: Education to be provided Goal status: Ongoing  5.  Pt. Will write a sentence with 75% legibility with modified independence Baseline: 01/04/24:  One sentence completed in 2 min. & 51 sec. With 50% legibility.11/09/2023: 50% legibility for one sentence. Eval: 50% legibility for one printed sentence Goal status: Ongoing   6.  Pt. Will improve bilateral [facility] skills by 3 sec. on the 9 Hole Peg Test to be able to manipulate small objects.  Baseline: 9 Hole Peg Test: R: 1 min. & 11 sec.  L: 46 sec. 11/09/2023: 9 Hole Peg test: Right: 1 min. & 22 sec; Left: 46 sec Eval: 9 Hole Peg test: Right: 1 min. & 46 sec; Left: 53 sec Goal status:New  7. Pt. Will improve the Mam-20 score by 5 points to reflect ADL/IADL improvement. Baseline: Mam-20 sum score: 53/80, Mam Measure score: 52.4 Goal status New   ASSESSMENT:  CLINICAL IMPRESSION:  Pt. continues to present with increased tone and tightness initially in the right scapular, and shoulder musculature, however is improved following manual therapy, and UE stretches. Pt. overall shoulder pain improved with treatment. Pt. requires support proximally at the RUE  during functional reaching tasks, and AAROM exercises. Pt. continues to present with limited functional reach during daily tasks 2/2 pain and impaired RUE strength. Pt. continues to benefit  from OT services to work on improving overall bilateral UE functioning in order to maximize engagement in, and efficiency in ADLs, and IADL tasks, and provide education about compensatory strategies.    PERFORMANCE DEFICITS: in functional skills including ADLs, IADLs, coordination, dexterity, proprioception, sensation, ROM, strength, pain, Fine motor control, Gross motor control, and UE functional use, cognitive skills including , and psychosocial skills including coping strategies, environmental adaptation, and routines and behaviors.   IMPAIRMENTS: are limiting patient from ADLs, IADLs, and leisure.   CO-MORBIDITIES: may have co-morbidities  that affects occupational performance. Patient will benefit from skilled OT to address above impairments and improve overall function.  MODIFICATION OR ASSISTANCE TO COMPLETE EVALUATION: Min-Moderate modification of tasks or assist with assess necessary to complete an evaluation.  OT OCCUPATIONAL PROFILE AND HISTORY: Detailed assessment: Review of records and additional review of physical, cognitive, psychosocial history related to current functional performance.  CLINICAL DECISION MAKING: Moderate - several treatment options, min-mod task modification necessary  REHAB POTENTIAL: Good  EVALUATION COMPLEXITY: Moderate    PLAN:  OT FREQUENCY: 2x/week  OT DURATION: 12 weeks  PLANNED INTERVENTIONS: 97168 OT Re-evaluation, 97535 self care/ADL training, 02889 therapeutic exercise, 97530 therapeutic activity, 97112 neuromuscular re-education, 97140 manual therapy, 97018 paraffin, 02989 moist heat, 97034 contrast bath, 97760 Orthotics management and training, 02239 Splinting (initial encounter), energy conservation, patient/family education, and DME and/or AE  instructions  RECOMMENDED OTHER SERVICES:   PT  CONSULTED AND AGREED WITH PLAN OF CARE: Patient  PLAN FOR NEXT SESSION:   Treatment  Richardson Otter, MS, OTR/L   02/03/2024, 5:50 PM

## 2024-02-08 ENCOUNTER — Encounter: Payer: Self-pay | Admitting: Physical Therapy

## 2024-02-08 ENCOUNTER — Ambulatory Visit: Admitting: Occupational Therapy

## 2024-02-08 ENCOUNTER — Ambulatory Visit: Admitting: Physical Therapy

## 2024-02-08 DIAGNOSIS — M6281 Muscle weakness (generalized): Secondary | ICD-10-CM

## 2024-02-08 DIAGNOSIS — R269 Unspecified abnormalities of gait and mobility: Secondary | ICD-10-CM

## 2024-02-08 DIAGNOSIS — R262 Difficulty in walking, not elsewhere classified: Secondary | ICD-10-CM

## 2024-02-08 DIAGNOSIS — R531 Weakness: Secondary | ICD-10-CM

## 2024-02-08 DIAGNOSIS — R278 Other lack of coordination: Secondary | ICD-10-CM

## 2024-02-08 DIAGNOSIS — R2681 Unsteadiness on feet: Secondary | ICD-10-CM

## 2024-02-08 NOTE — Therapy (Signed)
 OUTPATIENT PHYSICAL THERAPY NEURO TREATMENT     Patient Name: Daniel Daugherty MRN: [id number] DOB:12-04-1965, 58 y.o., male Today's Date: 02/08/2024   PCP: Odell Chard, Edra GRADE, MD  REFERRING PROVIDER: Odell Chard, Edra GRADE, MD   END OF SESSION:    PT End of Session - 02/08/24 1121     Visit Number 26    Number of Visits 43    Date for PT Re-Evaluation 03/28/24    Progress Note Due on Visit 30    PT Start Time 1145    PT Stop Time 1228    PT Time Calculation (min) 43 min    Equipment Utilized During Treatment Gait belt    Activity Tolerance Patient tolerated treatment well    Behavior During Therapy WFL for tasks assessed/performed              Past Medical History:  Diagnosis Date   Hypertension    Past Surgical History:  Procedure Laterality Date   DG HAND LEFT COMPLETE ([facility] HX) Left 04/13/1999   There are no active problems to display for this patient.   ONSET DATE: 2022 is when he noticed the change and that was around the same time he was diagnosed with Parkinson's  REFERRING DIAG:  G20.A1 (ICD-10-CM) - Parkinson disease (HCC)  Z74.1 (ICD-10-CM) - Requires daily assistance for activities of daily living (ADL) and comfort needs   THERAPY DIAG:  Muscle weakness (generalized)  Difficulty in walking, not elsewhere classified  Abnormality of gait  Generalized weakness  Unsteadiness on feet  Rationale for Evaluation and Treatment: Rehabilitation  SUBJECTIVE:                                                                                                                                                                                             SUBJECTIVE STATEMENT: TODAY 02/08/24: Pt reports 5/10 pain in his neck and shoulders with concordant stiffness. Drove here today which caused his pain to flare up.  PREVIOUSLY-  Pt reports he has been doing alright.  States his son turned 34y.o. this past Sunday and they had a cookout to celebrate. Pt  reports he has gotten lazy since last session, not performing his HEP, and has been kicking back. However, later states he has been encouraging himself to stay active and walk every day using his walker as needed  Pt reports he still hasn't called Neurology office to set-up an appointment. Pt reports he stopped coming to therapy because he felt like he wasn't getting any better and it was discouraging. Reports he doesn't feel safe trying to do the things at home that he  does at therapy. Therapist educated pt that he is only to do the prescribed HEP at home and that the focus of therapy sessions is to challenge him to perform tasks that he is not safe to perform without a skilled physical therapist in order to advance his mobility.  Pt states he is ready to get better and he is frustrated with his CLOF. Pt reports he has looked up some things on social media for support and to find ideas on how to move more safely and independently. Pt sates the idea of the [employer] group is intimidating to him and he is concerned he will get frustrated by his inability to do as well as other people in the group.   Pt sates he is trying to get dressed faster, but when he times himself he hasn't increased his speed. Pt states when he gets flustered and is trying to move fast, then his tremor increase and he has to stop and relax to get them to calm back down.  Pt states he knows he needs to keep coming to therapy, but he isn't improving as fast as he wants to. Pt states he doesn't want to keep missing out on life events because of his impairments. But he can't tell how the improvements he is making in therapy are improving his every day life. Despite this, pt in agreement to complete re-certification at this time to allow him to continue with therapy because he wants to get better. Therapist educated pt on goal to focus more on specific functional mobility tasks he is having difficulty with at home.  Pt  accompanied by: self  PERTINENT HISTORY: Parkinsonism, Vitamin D deficiency, Hyperlipidemia, HTN, poor medication compliance (per chart review)  PAIN:  Are you having pain? Yes: NPRS scale: 5/10, but states it ain't that bad Pain location: R shoulder and low back Pain description: throbbing in shoulder Aggravating factors: overhead movements or reaching back Relieving factors: rest and at home uses a massager  PRECAUTIONS: Fall  RED FLAGS: None   WEIGHT BEARING RESTRICTIONS: No  FALLS: Has patient fallen in last 6 months? Yes. 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   PATIENT GOALS: Get my balance right with my walking, help get my R arm stronger and be able to move it more, improve strength in my legs (specifically the Right)  OBJECTIVE:  Note: Objective measures were completed at Evaluation unless otherwise noted.  DIAGNOSTIC FINDINGS: N/A  COGNITION: Overall cognitive status: Within functional limits for tasks assessed; however, did not assess higher level cognitive tasks   SENSATION: WFL Light touch on screen Pt reports R foot falls asleep sitting on toilet and he has difficult time getting feeling back in it before feeling safe standing up.  COORDINATION: Overall bradykinesia with decreased speed and amplitude of movements  EDEMA:  None present  MUSCLE TONE: Not formally  assessed  MUSCLE LENGTH: Not formally assessed, but may have some hamstring tightness on R associated  with quad weakness and inability to achieve full knee extension in sitting  DTRs:  Not assessed  POSTURE: rounded shoulders, forward head, increased thoracic kyphosis, posterior pelvic tilt, and flexed trunk    LOWER EXTREMITY ROM:     Active ROM Right Eval Left Eval  Hip flexion Decreased in sitting compared to L   Hip extension    Hip abduction    Hip adduction    Hip internal rotation    Hip external rotation    Knee flexion    Knee extension Lacks full knee extension in sitting   Ankle dorsiflexion Limited in sitting Limited in sitting  Ankle plantarflexion    Ankle inversion    Ankle eversion     (Blank rows = not tested)  LOWER EXTREMITY MMT:    MMT Right Eval Left Eval  Hip flexion 3- 4+  Hip extension    Hip abduction    Hip adduction    Hip internal rotation    Hip external rotation    Knee flexion 3 4+  Knee extension 3- 4+  Ankle dorsiflexion 3- 4+  Ankle plantarflexion 3- 4+  Ankle inversion    Ankle eversion    (Blank rows = not tested)  Manual Muscle Test Scale 0/5 = No muscle contraction can be seen or felt 1/5 = Contraction can be felt, but there is no motion 2-/5 = Part moves through incomplete ROM w/ gravity decreased 2/5 = Part moves through complete ROM w/ gravity decreased 2+/5 = Part moves through incomplete ROM (<50%) against gravity or through complete ROM w/ gravity 3-/5 = Part moves through incomplete ROM (>50%) against gravity 3/5 = Part moves through complete ROM against gravity 3+/5 = Part moves through complete ROM against gravity/slight resistance 4-/5= Holds test position against slight to moderate pressure 4/5 = Part moves through complete ROM against gravity/moderate resistance 4+/5= Holds test position against moderate to strong pressure 5/5 = Part moves through complete ROM against gravity/full resistance  BED MOBILITY:   Sit to supine   Supine to sit   Need to assess  ADL: Reports dificulty getitng on R shoe, specifically having trouble pushing his foot down in his shoe  TRANSFERS: Assistive device utilized: Hurry cane  Sit to stand: Modified independence and SBA Stand to sit: Modified independence and SBA Chair to chair: Modified independence and SBA Floor: would benefit from testing in future  RAMP:  Level of Assistance:   Assistive device utilized: Hurry cane Ramp Comments: would benefit from assessing  CURB:  Level of Assistance:   Assistive device utilized: [employer] Comments: would benefit from assessing  STAIRS: Level of Assistance: CGA and Min A Stair Negotiation Technique: Step to Pattern Forwards with Bilateral Rails Number of Stairs: 4  Height of Stairs: 6in  Comments: leading with R LE in both directions, attempts reciprocal pattern on ascent with sufficient strength, but having uncontrolled anterior lean during descent  GAIT: Gait pattern: step through pattern, decreased arm swing- Right, decreased arm swing- Left, decreased step length- Right, decreased step length- Left, decreased stride length, decreased hip/knee flexion- Right, decreased hip/knee flexion- Left, decreased ankle dorsiflexion- Right, decreased ankle dorsiflexion- Left, decreased trunk rotation, trunk flexed, poor foot clearance- Right, and poor foot clearance- Left Distance walked: 181ft Assistive device utilized: None Level of assistance: CGA Comments: significantly decreased  FUNCTIONAL TESTS:  5 times sit to stand: 38.53 seconds arms across chest Timed up and go (TUG): 19.46 seconds without AD 6 minute walk test: 10/04/2023: 552ft (  175m at 0.21m/s avg) 10 meter walk test: 0.56 m/s without AD Mini-Best: 10/04/2023: 8/28  PATIENT SURVEYS:  ABC scale 17.5% with pt feeling <40% confident on all of the items, pt feels only 30% confident walking around the house, 10% confident getting in/out of car, and 20%  reaching for items  OCULOMOTOR EXAM from 11/16/2023 - performed due to Neurology note reporting concerns of decreased vertical gaze:  Ocular Alignment: normal  Ocular ROM: No Limitations  Spontaneous Nystagmus: absent  Gaze-Induced Nystagmus: absent  Smooth Pursuits: intact, good vertical movements with full range  Horizontal and Vertical Saccades: extra eye movements, slow, and frequently blinks, a few extra eye movements when moving to superior target                                                                                                                               TREATMENT DATE: 02/08/24    Unless otherwise stated, CGA was provided and gait belt donned in order to ensure pt safety throughout session.  TA- To improve functional movements patterns for everyday tasks    B UE and B LE reciprocal movement pattern on Octane for cardiovascular training and B LE functional strengthening against level 1 resistance for 5 minute, increased to level 2 for 1 minutes,  totaling 6 minutes and 0.43 mile, average of 32 SPM - goal to keep SPM >30 with pt benefiting from encouragement to maintain speed throughout with sustained attention on the task  STS from green chair, pushing up with hands-on knees to standing with bilateral shoulder extension c YTB for improved upright posture 2 x 10    Seated PWR step with reach outside BOS with visual cues for speed and range. 2x30. Visual cue of theraband on floor to encourage increase volume hip abduction during twist.  STS PWR Twist variant at speed bag. Twist hard enough to make speed bag bounce twice. Verbal cue to step into twist. 1 display of lateral LOB that pt was able to correct with stepping strategy and PT assist.  Side steps with 4 x 90 degree turns 8 total laps. Stomps on spikey ball incorporated after 2 laps. Leading with each LE 3x and stomping with lead LE.     PATIENT EDUCATION: Education details: exercise technique,  reassessment  Person educated: Patient Education method: Explanation, demo, vc Education comprehension: verbalized understanding and needs further education  HOME EXERCISE PROGRAM:  Access Code: [email or web address] URL: [email or web address] Date: 12/02/2023 Prepared by: Connell Kiss  Exercises - Sidelying Thoracic Rotation with Open Book  - 1 x daily - 7 x weekly - 2 sets - 10 reps - Supine Shoulder External Rotation in Abduction  - 1 x daily - 7 x weekly - 2 sets - 10 reps - 10 seconds hold - Seated Scapular Retraction  - 1 x daily - 7 x weekly - 3 sets - 10 reps - 3 seconds hold - Sit to Stand with Arm Swing  -  1 x daily - 7 x weekly - 2 sets - 10 reps   GOALS: Goals reviewed with patient? Yes  SHORT TERM GOALS: Target date: 02/15/2024    Patient will be independent in home exercise program to improve strength/mobility for better functional independence with ADLs. Baseline: need to initiate 10/07/2023: provided 12/02/2023: updated Goal status: IN PROGRESS   LONG TERM GOALS: Target date: 03/28/2024   1.  Patient (< 56 years old) will complete five times sit to stand test in < 12 seconds indicating an increased LE strength and improved balance. Baseline: 09/28/23: 38.53 seconds 11/09/2023: 22.13 seconds with arms across chest 01/04/2024: 20.12 seconds without UE support Goal status: IN PROGRESS   2. Patient will reduce timed up and go to <11 seconds to reduce fall risk and demonstrate improved transfer/gait ability. Baseline: 09/28/23: 19.46 seconds without AD 11/09/2023: 16.96 seconds, no AD, with close SBA for balance safety 01/04/2024: 15.845 seconds, no AD with close SBA for safety Goal status: IN PROGRESS  3.  Patient will increase 10 meter walk test to >1.73m/s as to improve gait speed for better community ambulation and to reduce fall risk. Baseline: 09/28/23: 0.55m/s without AD 11/09/2023: 0.89 m/s (11.50 seconds seconds 10.94 seconds) no AD, with close SBA for  safety 01/04/2024: 0.935 m/s without AD, CGA/close SBA for safety Goal status: IN PROGRESS  4.  Patient will increase six minute walk test distance to >1021ft for progression to community ambulator and improve gait ability Baseline: 10/04/2023: 557ft (169m at 0.1m/s avg) 11/09/2023: 1061 feet (323 meters, Avg speed 0.897 m/s) without AD Goal status: MET and Advanced 11/09/2023 Goal Updated: distance > 1354ft 01/13/2024: 832  ft mostly without SPC but then pt needed SPC due to fatigue, required one break (standing)   5. Patient will increase MiniBest Test score to >18/28 to indicate a reduced risk for falling and demonstrate increased independence with functional mobility and ADLs.  Baseline: 10/04/2023: 8/28  11/16/2023: 16/28  01/22/2024: deferred 6/17: 20/28  Goal status: MET  6. Patient will increase ABC scale score >80% to demonstrate better functional mobility and better confidence with ADLs.   Baseline: 09/28/23: 17.5% 11/09/2023: 21.25% - pt reports he rated the test as is if he was not using his walker because this is his long term goal  01/04/2024: 35.625% - continuing to rate it as if he is not using his walker   Goal status: IN PROGRESS   ASSESSMENT:  CLINICAL IMPRESSION:  Pt arrived to therapy with good motivation for PT activities. Therapy continued to focus on large volume movements and speed of the movement. Pt was able to avg >30 SPM on octane today when cued for speed. Pt also exhibits greater movement through range and speed with visual cue from PT. YTB added to PWR ups to encourage upright posture. Pt did display one LOB during modified PWR twists by misplacing is L foot during a twist. Verbal cue to step then swing resulted in no additonal LOB and less sway during twist. Pt will continue to benefit from skilled physical therapy intervention to address impairments, improve QOL, and attain therapy goals.       OBJECTIVE IMPAIRMENTS: Abnormal gait, decreased activity tolerance,  decreased balance, decreased coordination, decreased endurance, decreased knowledge of condition, decreased knowledge of use of DME, decreased mobility, difficulty walking, decreased ROM, decreased strength, decreased safety awareness, improper body mechanics, postural dysfunction, and pain.   ACTIVITY LIMITATIONS: carrying, lifting, bending, standing, squatting, sleeping, stairs, transfers, bed mobility, continence, bathing, toileting, dressing,  reach over head, hygiene/grooming, and locomotion level  PARTICIPATION LIMITATIONS: meal prep, cleaning, laundry, and community activity  PERSONAL FACTORS: Fitness and 3+ comorbidities: Vitamin D deficiency, Hyperlipidemia, and HTN are also affecting patient's functional outcome.   REHAB POTENTIAL: Good  CLINICAL DECISION MAKING: Evolving/moderate complexity  EVALUATION COMPLEXITY: Moderate  PLAN:  PT FREQUENCY: 1-2x/week  PT DURATION: 12 weeks  PLANNED INTERVENTIONS: 97164- PT Re-evaluation, 97110-Therapeutic exercises, 97530- Therapeutic activity, 97112- Neuromuscular re-education, 97535- Self Care, [id number]- Manual therapy, [phone]- Gait training, [phone]- Orthotic Fit/training, [phone]- Canalith repositioning, [phone]- Electrical stimulation (manual), Patient/Family education, Balance training, Stair training, Joint mobilization, Vestibular training, Visual/preceptual remediation/compensation, DME instructions, Cryotherapy, and Moist heat  PLAN FOR NEXT SESSION:  **focus on functional mobility tasks patient reports as challenging** - gait training with increased intensity - dynamic gait with focus on increased upright posture and speed of ambulation with arm swing (add AWs to UEs with boxing gloves)  - increased focus on turns  - continue arm reaching and trunk rotation (specifically to R) - stepping strategy re-training: focus on posterior stepping - large amplitude UE movements with promotion of improved upright posture   Casilda Human, SPT     [facility]  11:25 AM 02/08/24

## 2024-02-08 NOTE — Therapy (Addendum)
 Occupational Therapy Treatment Note   Patient Name: Daniel Daugherty MRN: [id number] DOB:27-May-1966, 58 y.o., male Today's Date: 02/08/2024  PCP: N/A REFERRING PROVIDER: Odell Tor Edra CINDERELLA, MD  END OF SESSION:  OT End of Session - 02/08/24 1333     Visit Number 26    Number of Visits 48    Date for OT Re-Evaluation 03/28/24    OT Start Time 1530    OT Stop Time 1615    OT Time Calculation (min) 45 min    Activity Tolerance Patient tolerated treatment well    Behavior During Therapy WFL for tasks assessed/performed               Past Medical History:  Diagnosis Date   Hypertension    Past Surgical History:  Procedure Laterality Date   DG HAND LEFT COMPLETE ([facility] HX) Left 04/13/1999   There are no active problems to display for this patient.   ONSET DATE: 05/2021  REFERRING DIAG: Parkinson's Disease  THERAPY DIAG:  Muscle weakness (generalized)  Other lack of coordination  Rationale for Evaluation and Treatment: Rehabilitation  SUBJECTIVE:   SUBJECTIVE STATEMENT:  Pt. reports that he is already starting to pack for his cruise coming up later this month. Pt accompanied by: self  PERTINENT HISTORY:   Pt. is a 58 y.o. male who was diagnosed with Parkinson's Disease in 2022. Pt. reports having had a progressive decline in function since having hernia surgery, and right shoulder rotator cuff surgery. PMHx included: HTN. Of note; Pt. was being followed by [facility], and receiving Home health therapy services. Pt. changed physicians, and was referred for outpatient rehab services here.  PRECAUTIONS: None  WEIGHT BEARING RESTRICTIONS: No  PAIN:  Are you having pain? 2/10 Right neck, shoulder.  FALLS: Has patient fallen in last 6 months? No, 1 small slip near the bed tripped over shoes  LIVING ENVIRONMENT: Lives with: lives alone Lives in: [place],  Stairs: Entry level Has following equipment at home: Quad cane small base, rollator, shower chair, grab bars,  hand rail at [employer]  PLOF: Independent  PATIENT GOALS: Improve self-care  OBJECTIVE:  Note: Objective measures were completed at Evaluation unless otherwise noted.  HAND DOMINANCE: Right  ADLs:  Assist at home:  Ex wife, and children assist several times a week  Transfers/ambulation related to ADLs: Modified Independent Eating: Difficulty using dominant right hand. Drops food when scooping, Both hands for stabilizing drinks, and uses a straw. Unable to cut food.  Grooming: Increased time required for all grooming tasks. UB Dressing: Difficulty managing buttons LB Dressing: Less assist with pants with elastic, more assist with dress pants Toileting: Increased time for hygiene. Bathing: Requires increased time to complete Tub Shower transfers: Modified Independence with increased time  IADLs: Shopping: Son assists with grocery shopping Light housekeeping: Son's assist. Pt. Does light rising off of dishes. Meal Prep: Increased time. Independent with microwave, and air fryer. Difficulty opening bottles, containers Community mobility:  Transportation, light driving to grocery store Medication management:  Son's set-up weekly pillbox, Pt. Is responsible for taking the medications Financial management: Son's assist with monthly finances Handwriting: 50% legible Hobbies: Dancing, watching sports Career/work: Restaurant manager, fast food  MOBILITY STATUS: Uses a cane; shuffling gait  FUNCTIONAL OUTCOME MEASURES:  Mam-20 sum score: 53/80, MAM Measure score: 52.4   UPPER EXTREMITY ROM:    Active ROM Right eval Right 11/09/23 01/04/24 Left Eval [facility] overall  Shoulder flexion WFL 120(134) 873(862)   Shoulder abduction 52(100) 60(110) 31(881)  Shoulder adduction      Shoulder extension      Shoulder internal rotation      Shoulder external rotation      Elbow flexion [facility] WFL   Elbow extension [facility] WFL   Wrist flexion      Wrist extension [facility] WFL   Wrist ulnar deviation       Wrist radial deviation      Wrist pronation      Wrist supination      (Blank rows = not tested)  UPPER EXTREMITY MMT:     MMT Right eval Right 11/09/23 Right 01/04/24 Left Eval 4-/5 overall Left 11/09/23 4/5 Overall  Shoulder flexion 3/5 3-/5 3-/5 3-/5   Shoulder abduction 2/5 2/5 2+/5 2+/5   Shoulder adduction       Shoulder extension       Shoulder internal rotation       Shoulder external rotation       Middle trapezius       Lower trapezius       Elbow flexion 3/5 3+/5 3+/5 3+/5   Elbow extension 3/5 3+5 4-/5 4-/5   Wrist flexion       Wrist extension 3/5 3/5 3/5 3/5   Wrist ulnar deviation       Wrist radial deviation       Wrist pronation       Wrist supination       (Blank rows = not tested)  HAND FUNCTION:   Eval: Grip strength: Right: 50 lbs; Left: 34 lbs, Lateral pinch: Right: 17 lbs, Left: 9 lbs, 3 point pinch: Right: 12 lbs, Left: 11 lbs  11/09/23: Grip strength: Right: 60 lbs; Left: 35 lbs, Lateral pinch: Right: 19 lbs, Left: 11 lbs, 3 point pinch: Right: 13 lbs, Left: 9 lbs  01/04/24:Grip strength: Right: 40 lbs; Left: 41 lbs, Lateral pinch: Right: 17 lbs, Left: 8 lbs, 3 point pinch: Right: 9 lbs, Left: 10 lbs  COORDINATION:  Eval: 9 Hole Peg test: Right: 1 min. & 46 sec; Left: 53 sec  11/09/2023: 9 Hole Peg test: Right: 1 min. & 22 sec; Left: 46 sec  01/04/24: 9 Hole Peg test: Right: 1 min. & 11 sec; Left: 46 sec   SENSATION:  Light touch: WFL Proprioception: WFL  EDEMA: N/A  COGNITION: Overall cognitive status: Within functional limits for tasks assessed  VISION:  Does not wear glasses.  VISION ASSESSMENT: To be further assessed in functional context  PERCEPTION: WFL  PRAXIS: Impaired: Motor planning                                                                                                                         TREATMENT DATE: 02/08/24  Therapeutic Ex:   -UE strengthening through reciprocal motion using the SciFit for 10  min. at resistance level 4.0 with constant monitoring of the BUEs. -Right shoulder AROM/PROM was performed with slow prolonged gentle stretching with hand over hand assist required  when elevating the Right UE in preparation for functional reaching.  Manual Therapy:   -Soft tissue massage was performed to the right scapular, and shoulder musculature 2/2 tightness. -Manual therapy was performed independent of, and in preparation for therapeutic Ex, and engagement of the RUE.   Self-care:  -Pt. worked on the IADL of writing including: assessing the pen grasp, letter formation, and legibility when formulating sentences.  -Assessed a prehension patterns using a tripod grasp to hold, and use a pen using a standard pen, and pen with a pen grip, as well as a Pen Again when formulating sentences.   PATIENT EDUCATION: Education details: UE ROM, ADL functional status Person educated: Patient Education method: Explanation, Demonstration, Tactile cues, and Verbal cues Education comprehension: needs further education  HOME EXERCISE PROGRAM:  Continue to assess, and provide as indicated.   GOALS: Goals reviewed with patient? Yes  SHORT TERM GOALS: Target date: 02/15/2024     Pt. Will be independent with HEPs for RUE. Baseline: 01/04/24: Independent 11/09/2023: Independent Eval: No current HEP Goal status:Ongoing  LONG TERM GOALS: Target date: 03/28/2024  1.  Pt. Will increase right shoulder abduction by 10 degrees to assist with UE dressing Baseline: 01/04/24: 31(881) Pt. reports having donning his shirt is easier with his new routine 11/09/23: Right 60/(110) Eval: Right 52(100) Goal status: Ongoing  2.  Pt. Will increase BUE strength by 2 mm grades to assist with ADLs. Baseline: 01/04/24:  Right: shoulder flexion: 3-/5, abduction: 2/5, elbow flexion 4-/5, extension: 3+/5, wrist extension: 3/5. Left: 4/5 overallEval: Right: shoulder flexion: 3/5, abduction: 2/5, elbow flexion/extension: 3/5, wrist  extension: 3/5. Left: 4-/5 overall4/08/25: Right: shoulder flexion: 3-/5, abduction: 2/5, elbow flexion/extension: 3+/5, wrist extension: 3/5. Left: 4/5 overallEval: Right: shoulder flexion: 3/5, abduction: 2/5, elbow flexion/extension: 3/5, wrist extension: 3/5. Left: 4-/5 overall Goal status: Ongoing  3.  Pt. Will perform self-feeding with modified independence Baseline: 01/04/24: Pt. reports making improvements with self-feeding. Pt. has difficulty at times with bringing his spoon to his mouth, often bringing an empty spoon to his mouth 11/09/23: Pt. Continues to have difficulty using dominant right hand. Drops food when scooping, Both hands for stabilizing drinks, and uses a straw. Unable to cut food. Eval: Difficulty using dominant right hand. Drops food when scooping, Both hands for stabilizing drinks, and uses a straw. Unable to cut food. Goal status: Ongoing  4.  Pt. Will demonstrate independence with proper A/E techniques/compensatory strategies for ADL/IADLs.  Baseline: 01/04/24: Continue ongoing as needed. 11/09/23: Continue Eval: Education to be provided Goal status: Ongoing  5.  Pt. Will write a sentence with 75% legibility with modified independence Baseline: 01/04/24:  One sentence completed in 2 min. & 51 sec. With 50% legibility.11/09/2023: 50% legibility for one sentence. Eval: 50% legibility for one printed sentence Goal status: Ongoing   6.  Pt. Will improve bilateral [facility] skills by 3 sec. on the 9 Hole Peg Test to be able to manipulate small objects.  Baseline: 9 Hole Peg Test: R: 1 min. & 11 sec.  L: 46 sec. 11/09/2023: 9 Hole Peg test: Right: 1 min. & 22 sec; Left: 46 sec Eval: 9 Hole Peg test: Right: 1 min. & 46 sec; Left: 53 sec Goal status:New  7. Pt. Will improve the Mam-20 score by 5 points to reflect ADL/IADL improvement. Baseline: Mam-20 sum score: 53/80, Mam Measure score: 52.4 Goal status New   ASSESSMENT:  CLINICAL IMPRESSION:  Pt. was able to write one sentence  with 75% legibility using a PenAgain  50% legibility with the standard pen, and 50% legibility using the Pen Grip. Pt. required increased time to complete each sentence. Pt. continues to present with increased flexor tone and tightness initially in the right scapular, and shoulder musculature, however has improved following manual therapy, and UE stretches. Pt. continues to benefit from OT services to work on improving overall bilateral UE functioning in order to maximize engagement in, and efficiency in ADLs, and IADL tasks, and provide education about compensatory strategies.    PERFORMANCE DEFICITS: in functional skills including ADLs, IADLs, coordination, dexterity, proprioception, sensation, ROM, strength, pain, Fine motor control, Gross motor control, and UE functional use, cognitive skills including , and psychosocial skills including coping strategies, environmental adaptation, and routines and behaviors.   IMPAIRMENTS: are limiting patient from ADLs, IADLs, and leisure.   CO-MORBIDITIES: may have co-morbidities  that affects occupational performance. Patient will benefit from skilled OT to address above impairments and improve overall function.  MODIFICATION OR ASSISTANCE TO COMPLETE EVALUATION: Min-Moderate modification of tasks or assist with assess necessary to complete an evaluation.  OT OCCUPATIONAL PROFILE AND HISTORY: Detailed assessment: Review of records and additional review of physical, cognitive, psychosocial history related to current functional performance.  CLINICAL DECISION MAKING: Moderate - several treatment options, min-mod task modification necessary  REHAB POTENTIAL: Good  EVALUATION COMPLEXITY: Moderate    PLAN:  OT FREQUENCY: 2x/week  OT DURATION: 12 weeks  PLANNED INTERVENTIONS: 97168 OT Re-evaluation, 97535 self care/ADL training, 02889 therapeutic exercise, 97530 therapeutic activity, 97112 neuromuscular re-education, 97140 manual therapy, 97018 paraffin,  02989 moist heat, 97034 contrast bath, 97760 Orthotics management and training, 02239 Splinting (initial encounter), energy conservation, patient/family education, and DME and/or AE instructions  RECOMMENDED OTHER SERVICES:   PT  CONSULTED AND AGREED WITH PLAN OF CARE: Patient  PLAN FOR NEXT SESSION:   Treatment  Richardson Otter, MS, OTR/L   02/08/2024, 2:13 PM

## 2024-02-10 ENCOUNTER — Ambulatory Visit

## 2024-02-10 ENCOUNTER — Telehealth: Payer: Self-pay

## 2024-02-10 ENCOUNTER — Ambulatory Visit: Admitting: Occupational Therapy

## 2024-02-10 NOTE — Telephone Encounter (Signed)
 PT reached out to pt via secure phone due to missed visit/no-show this morning. PT left VM letting pt know  missed visit and that he was also on schedule for OT this morning and to call back (provided clinic #) if he needs to cancel.   Darryle Patten PT, DPT

## 2024-02-15 ENCOUNTER — Ambulatory Visit: Admitting: Occupational Therapy

## 2024-02-15 ENCOUNTER — Encounter: Payer: Self-pay | Admitting: Physical Therapy

## 2024-02-15 ENCOUNTER — Ambulatory Visit: Admitting: Physical Therapy

## 2024-02-15 DIAGNOSIS — M6281 Muscle weakness (generalized): Secondary | ICD-10-CM

## 2024-02-15 DIAGNOSIS — R2681 Unsteadiness on feet: Secondary | ICD-10-CM

## 2024-02-15 DIAGNOSIS — R269 Unspecified abnormalities of gait and mobility: Secondary | ICD-10-CM

## 2024-02-15 DIAGNOSIS — R262 Difficulty in walking, not elsewhere classified: Secondary | ICD-10-CM

## 2024-02-15 DIAGNOSIS — R531 Weakness: Secondary | ICD-10-CM

## 2024-02-15 NOTE — Therapy (Signed)
 OUTPATIENT PHYSICAL THERAPY NEURO TREATMENT     Patient Name: Daniel Daugherty MRN: [id number] DOB:12/31/1965, 58 y.o., male Today's Date: 02/15/2024   PCP: Odell Chard, Edra GRADE, MD  REFERRING PROVIDER: Odell Chard, Edra GRADE, MD   END OF SESSION:    PT End of Session - 02/15/24 1137     Visit Number 27    Number of Visits 43    Date for PT Re-Evaluation 03/28/24    PT Start Time 1145    PT Stop Time 1228    PT Time Calculation (min) 43 min    Equipment Utilized During Treatment Gait belt    Activity Tolerance Patient tolerated treatment well    Behavior During Therapy WFL for tasks assessed/performed              Past Medical History:  Diagnosis Date   Hypertension    Past Surgical History:  Procedure Laterality Date   DG HAND LEFT COMPLETE ([facility] HX) Left 04/13/1999   There are no active problems to display for this patient.   ONSET DATE: 2022 is when he noticed the change and that was around the same time he was diagnosed with Parkinson's  REFERRING DIAG:  G20.A1 (ICD-10-CM) - Parkinson disease (HCC)  Z74.1 (ICD-10-CM) - Requires daily assistance for activities of daily living (ADL) and comfort needs   THERAPY DIAG:  Muscle weakness (generalized)  Difficulty in walking, not elsewhere classified  Generalized weakness  Abnormality of gait  Unsteadiness on feet  Rationale for Evaluation and Treatment: Rehabilitation  SUBJECTIVE:                                                                                                                                                                                             SUBJECTIVE STATEMENT: TODAY 02/15/24: Pt reports he is doing okay, but today is one of those days. Reports moving a bit slower and slight lack of motivation but willingness to participate in therapy. Reports neck soreness but not as bad as yesterday.   PREVIOUSLY-  Pt reports he has been doing alright.  States his son turned 34y.o.  this past Sunday and they had a cookout to celebrate. Pt reports he has gotten lazy since last session, not performing his HEP, and has been kicking back. However, later states he has been encouraging himself to stay active and walk every day using his walker as needed  Pt reports he still hasn't called Neurology office to set-up an appointment. Pt reports he stopped coming to therapy because he felt like he wasn't getting any better and it was discouraging. Reports he doesn't feel safe trying  to do the things at home that he does at therapy. Therapist educated pt that he is only to do the prescribed HEP at home and that the focus of therapy sessions is to challenge him to perform tasks that he is not safe to perform without a skilled physical therapist in order to advance his mobility.  Pt states he is ready to get better and he is frustrated with his CLOF. Pt reports he has looked up some things on social media for support and to find ideas on how to move more safely and independently. Pt sates the idea of the [employer] group is intimidating to him and he is concerned he will get frustrated by his inability to do as well as other people in the group.   Pt sates he is trying to get dressed faster, but when he times himself he hasn't increased his speed. Pt states when he gets flustered and is trying to move fast, then his tremor increase and he has to stop and relax to get them to calm back down.  Pt states he knows he needs to keep coming to therapy, but he isn't improving as fast as he wants to. Pt states he doesn't want to keep missing out on life events because of his impairments. But he can't tell how the improvements he is making in therapy are improving his every day life. Despite this, pt in agreement to complete re-certification at this time to allow him to continue with therapy because he wants to get better. Therapist educated pt on goal to focus more on specific functional mobility  tasks he is having difficulty with at home.  Pt accompanied by: self  PERTINENT HISTORY: Parkinsonism, Vitamin D deficiency, Hyperlipidemia, HTN, poor medication compliance (per chart review)  PAIN:  Are you having pain? Yes: NPRS scale: 5/10, but states it ain't that bad Pain location: R shoulder and low back Pain description: throbbing in shoulder Aggravating factors: overhead movements or reaching back Relieving factors: rest and at home uses a massager  PRECAUTIONS: Fall  RED FLAGS: None   WEIGHT BEARING RESTRICTIONS: No  FALLS: Has patient fallen in last 6 months? Yes. 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   PATIENT GOALS: Get my balance right with my walking, help get my R arm stronger and be able to move it more, improve strength in my legs (specifically the Right)  OBJECTIVE:  Note: Objective measures were completed at Evaluation unless otherwise noted.  DIAGNOSTIC FINDINGS: N/A  COGNITION: Overall cognitive status: Within functional limits for tasks assessed; however, did not assess higher level cognitive tasks   SENSATION: WFL Light touch on screen Pt reports R foot falls asleep sitting on toilet and he has difficult time getting feeling back in it before feeling safe standing up.  COORDINATION: Overall bradykinesia with decreased speed and amplitude of movements  EDEMA:  None  present  MUSCLE TONE: Not formally assessed  MUSCLE LENGTH: Not formally assessed, but  may have some hamstring tightness on R associated with quad weakness and inability to achieve full knee extension in sitting  DTRs:  Not assessed  POSTURE: rounded shoulders, forward head, increased thoracic kyphosis, posterior pelvic tilt, and flexed trunk    LOWER EXTREMITY ROM:     Active ROM Right Eval Left Eval  Hip flexion Decreased in sitting compared to L   Hip extension    Hip abduction    Hip adduction    Hip internal rotation    Hip external rotation    Knee flexion    Knee extension Lacks full knee extension in sitting   Ankle dorsiflexion Limited in sitting Limited in sitting  Ankle plantarflexion    Ankle inversion    Ankle eversion     (Blank rows = not tested)  LOWER EXTREMITY MMT:    MMT Right Eval Left Eval  Hip flexion 3- 4+  Hip extension    Hip abduction    Hip adduction    Hip internal rotation    Hip external rotation    Knee flexion 3 4+  Knee extension 3- 4+  Ankle dorsiflexion 3- 4+  Ankle plantarflexion 3- 4+  Ankle inversion    Ankle eversion    (Blank rows = not tested)  Manual Muscle Test Scale 0/5 = No muscle contraction can be seen or felt 1/5 = Contraction can be felt, but there is no motion 2-/5 = Part moves through incomplete ROM w/ gravity decreased 2/5 = Part moves through complete ROM w/ gravity decreased 2+/5 = Part moves through incomplete ROM (<50%) against gravity or through complete ROM w/ gravity 3-/5 = Part moves through incomplete ROM (>50%) against gravity 3/5 = Part moves through complete ROM against gravity 3+/5 = Part moves through complete ROM against gravity/slight resistance 4-/5= Holds test position against slight to moderate pressure 4/5 = Part moves through complete ROM against gravity/moderate resistance 4+/5= Holds test position against moderate to strong pressure 5/5 = Part moves through complete ROM against  gravity/full resistance  BED MOBILITY:  Sit to supine   Supine to sit   Need to assess  ADL: Reports dificulty getitng on R shoe, specifically having trouble pushing his foot down in his shoe  TRANSFERS: Assistive device utilized: Hurry cane  Sit to stand: Modified independence and SBA Stand to sit: Modified independence and SBA Chair to chair: Modified independence and SBA Floor: would benefit from testing in future  RAMP:  Level of Assistance:   Assistive device utilized: Hurry cane Ramp Comments: would benefit from assessing  CURB:  Level of Assistance:   Assistive device utilized: [employer] Comments: would benefit from assessing  STAIRS: Level of Assistance: CGA and Min A Stair Negotiation Technique: Step to Pattern Forwards with Bilateral Rails Number of Stairs: 4  Height of Stairs: 6in  Comments: leading with R LE in both directions, attempts reciprocal pattern on ascent with sufficient strength, but having uncontrolled anterior lean during descent  GAIT: Gait pattern: step through pattern, decreased arm swing- Right, decreased arm swing- Left, decreased step length- Right, decreased step length- Left, decreased stride length, decreased hip/knee flexion- Right, decreased hip/knee flexion- Left, decreased ankle dorsiflexion- Right, decreased ankle dorsiflexion- Left, decreased trunk rotation, trunk flexed, poor foot clearance- Right, and poor foot clearance- Left Distance walked: 175ft Assistive device utilized: None Level of assistance: CGA Comments: significantly decreased  FUNCTIONAL TESTS:  5 times sit to stand: 38.53 seconds arms across chest Timed up and go (TUG): 19.46 seconds  without AD 6 minute walk test: 10/04/2023: 511ft (118m at 0.82m/s avg) 10 meter walk test: 0.56 m/s without AD Mini-Best: 10/04/2023: 8/28  PATIENT SURVEYS:  ABC scale 17.5% with pt feeling <40% confident on all of the items, pt feels only 30% confident walking around the house, 10%  confident getting in/out of car, and 20% reaching for items  OCULOMOTOR EXAM from 11/16/2023 - performed due to Neurology note reporting concerns of decreased vertical gaze:  Ocular Alignment: normal  Ocular ROM: No Limitations  Spontaneous Nystagmus: absent  Gaze-Induced Nystagmus: absent  Smooth Pursuits: intact, good vertical movements with full range  Horizontal and Vertical Saccades: extra eye movements, slow, and frequently blinks, a few extra eye movements when moving to superior target                                                                                                                               TREATMENT DATE: 02/15/24    Unless otherwise stated, CGA was provided and gait belt donned in order to ensure pt safety throughout session.  TA- To improve functional movements patterns for everyday tasks    B UE and B LE reciprocal movement pattern on Octane for cardiovascular training and B LE functional strengthening against level 1 resistance for 3 minute, increased to level 2 for 3 minutes,  totaling 6 minutes and 0.41 mile, average of 30 SPM - goal to keep SPM >30 with pt benefiting from encouragement to maintain speed throughout with sustained attention on the task  STS from green chair, pushing up with hands-on knees to standing with bilateral shoulder extension c YTB for improved upright posture 2 x 10 - encourage loud verbalization during exercise.   Seated PWR step with reach outside BOS with visual cues for speed and range. 3x30. Visual cue of theraband on floor to encourage increase volume hip abduction during twist.  STS PWR Twist variant. Encourage pt to step before twisting to facilitate greater arm swing during twist. CGA x several reps   Gait training   No AD, pt and SPT holding SPC in each hand to facilitate reciprocal arm swing during gait. 242 [id number]. Seated rest break given      PATIENT EDUCATION: Education details: exercise technique,  reassessment  Person educated: Patient Education method: Explanation, demo, vc Education comprehension: verbalized understanding and needs further education  HOME EXERCISE PROGRAM:  Access Code: [email or web address] URL: [email or web address] Date: 12/02/2023 Prepared by: Connell Kiss  Exercises - Sidelying Thoracic Rotation with Open Book  - 1 x daily - 7 x weekly - 2 sets - 10 reps - Supine Shoulder External Rotation in Abduction  - 1 x daily - 7 x weekly - 2 sets - 10 reps - 10 seconds hold - Seated Scapular Retraction  - 1 x daily - 7 x weekly - 3 sets - 10 reps - 3 seconds hold - Sit to Stand with Arm Swing  - 1 x daily -  7 x weekly - 2 sets - 10 reps   GOALS: Goals reviewed with patient? Yes  SHORT TERM GOALS: Target date: 02/15/2024    Patient will be independent in home exercise program to improve strength/mobility for better functional independence with ADLs. Baseline: need to initiate 10/07/2023: provided 12/02/2023: updated Goal status: IN PROGRESS   LONG TERM GOALS: Target date: 03/28/2024   1.  Patient (< 25 years old) will complete five times sit to stand test in < 12 seconds indicating an increased LE strength and improved balance. Baseline: 09/28/23: 38.53 seconds 11/09/2023: 22.13 seconds with arms across chest 01/04/2024: 20.12 seconds without UE support Goal status: IN PROGRESS   2. Patient will reduce timed up and go to <11 seconds to reduce fall risk and demonstrate improved transfer/gait ability. Baseline: 09/28/23: 19.46 seconds without AD 11/09/2023: 16.96 seconds, no AD, with close SBA for balance safety 01/04/2024: 15.845 seconds, no AD with close SBA for safety Goal status: IN PROGRESS  3.  Patient will increase 10 meter walk test to >1.73m/s as to improve gait speed for better community ambulation and to reduce fall risk. Baseline: 09/28/23: 0.41m/s without AD 11/09/2023: 0.89 m/s (11.50 seconds seconds 10.94 seconds) no AD, with close SBA for  safety 01/04/2024: 0.935 m/s without AD, CGA/close SBA for safety Goal status: IN PROGRESS  4.  Patient will increase six minute walk test distance to >1043ft for progression to community ambulator and improve gait ability Baseline: 10/04/2023: 517ft (163m at 0.98m/s avg) 11/09/2023: 1061 feet (323 meters, Avg speed 0.897 m/s) without AD Goal status: MET and Advanced 11/09/2023 Goal Updated: distance > 1321ft 01/13/2024: 832  ft mostly without SPC but then pt needed SPC due to fatigue, required one break (standing)   5. Patient will increase MiniBest Test score to >18/28 to indicate a reduced risk for falling and demonstrate increased independence with functional mobility and ADLs.  Baseline: 10/04/2023: 8/28  11/16/2023: 16/28  01/22/2024: deferred 6/17: 20/28  Goal status: MET  6. Patient will increase ABC scale score >80% to demonstrate better functional mobility and better confidence with ADLs.   Baseline: 09/28/23: 17.5% 11/09/2023: 21.25% - pt reports he rated the test as is if he was not using his walker because this is his long term goal  01/04/2024: 35.625% - continuing to rate it as if he is not using his walker   Goal status: IN PROGRESS   ASSESSMENT:  CLINICAL IMPRESSION:   Pt arrived to therapy with good motivation for PT activities. Therapy continued to focus on large volume movements and speed of the movement. Pt was able to avg 30 SPM on octane today when cued for speed. Pt displayed increased ROM and speed during pwr moves today when emphasizing verbalizations during the intervention. Pt displayed 1 LOB during PWR twists when swinging and stepping to his right. Pt was able to sit back in his seat to avoid falling using B UE support and SPT assist. Pt will continue to benefit from skilled physical therapy intervention to address impairments, improve QOL, and attain therapy goals.       OBJECTIVE IMPAIRMENTS: Abnormal gait, decreased activity tolerance, decreased balance, decreased  coordination, decreased endurance, decreased knowledge of condition, decreased knowledge of use of DME, decreased mobility, difficulty walking, decreased ROM, decreased strength, decreased safety awareness, improper body mechanics, postural dysfunction, and pain.   ACTIVITY LIMITATIONS: carrying, lifting, bending, standing, squatting, sleeping, stairs, transfers, bed mobility, continence, bathing, toileting, dressing, reach over head, hygiene/grooming, and locomotion level  PARTICIPATION LIMITATIONS:  meal prep, cleaning, laundry, and community activity  PERSONAL FACTORS: Fitness and 3+ comorbidities: Vitamin D deficiency, Hyperlipidemia, and HTN are also affecting patient's functional outcome.   REHAB POTENTIAL: Good  CLINICAL DECISION MAKING: Evolving/moderate complexity  EVALUATION COMPLEXITY: Moderate  PLAN:  PT FREQUENCY: 1-2x/week  PT DURATION: 12 weeks  PLANNED INTERVENTIONS: 97164- PT Re-evaluation, 97110-Therapeutic exercises, 97530- Therapeutic activity, 97112- Neuromuscular re-education, 97535- Self Care, [id number]- Manual therapy, [phone]- Gait training, [phone]- Orthotic Fit/training, [phone]- Canalith repositioning, [phone]- Electrical stimulation (manual), Patient/Family education, Balance training, Stair training, Joint mobilization, Vestibular training, Visual/preceptual remediation/compensation, DME instructions, Cryotherapy, and Moist heat  PLAN FOR NEXT SESSION:  **focus on functional mobility tasks patient reports as challenging** - gait training with increased intensity - dynamic gait with focus on increased upright posture and speed of ambulation with arm swing (add AWs to UEs with boxing gloves)  - increased focus on turns  - continue arm reaching and trunk rotation (specifically to R) - stepping strategy re-training: focus on posterior stepping - large amplitude UE movements with promotion of improved upright posture    Casilda Human, SPT   [facility]   4:17 PM 02/15/24

## 2024-02-15 NOTE — Therapy (Cosign Needed)
 Occupational Therapy Treatment Note   Patient Name: Daniel Daugherty MRN: [id number] DOB:1966-08-02, 58 y.o., male Today's Date: 02/15/2024  PCP: N/A REFERRING PROVIDER: Odell Tor Edra CINDERELLA, MD  END OF SESSION:  OT End of Session - 02/15/24 1659     Visit Number 27    Number of Visits 48    Date for OT Re-Evaluation 03/28/24    OT Start Time 1106    OT Stop Time 1145    OT Time Calculation (min) 39 min    Activity Tolerance Patient tolerated treatment well    Behavior During Therapy WFL for tasks assessed/performed                Past Medical History:  Diagnosis Date   Hypertension    Past Surgical History:  Procedure Laterality Date   DG HAND LEFT COMPLETE ([facility] HX) Left 04/13/1999   There are no active problems to display for this patient.   ONSET DATE: 05/2021  REFERRING DIAG: Parkinson's Disease  THERAPY DIAG:  Muscle weakness (generalized)  Rationale for Evaluation and Treatment: Rehabilitation  SUBJECTIVE:   SUBJECTIVE STATEMENT:  Pt. reports that he is excited for his cruise in 10 days.  Pt accompanied by: self  PERTINENT HISTORY:   Pt. is a 58 y.o. male who was diagnosed with Parkinson's Disease in 2022. Pt. reports having had a progressive decline in function since having hernia surgery, and right shoulder rotator cuff surgery. PMHx included: HTN. Of note; Pt. was being followed by [facility], and receiving Home health therapy services. Pt. changed physicians, and was referred for outpatient rehab services here.  PRECAUTIONS: None  WEIGHT BEARING RESTRICTIONS: No  PAIN:  Are you having pain?  02/15/2024: 5/10 pain in RUE during shoulder AAROM/PROM.   2/10   FALLS: Has patient fallen in last 6 months? No, 1 small slip near the bed tripped over shoes  LIVING ENVIRONMENT: Lives with: lives alone Lives in: [place],  Stairs: Entry level Has following equipment at home: Quad cane small base, rollator, shower chair, grab bars, hand rail at  [employer]  PLOF: Independent  PATIENT GOALS: Improve self-care  OBJECTIVE:  Note: Objective measures were completed at Evaluation unless otherwise noted.  HAND DOMINANCE: Right  ADLs:  Assist at home:  Ex wife, and children assist several times a week  Transfers/ambulation related to ADLs: Modified Independent Eating: Difficulty using dominant right hand. Drops food when scooping, Both hands for stabilizing drinks, and uses a straw. Unable to cut food.  Grooming: Increased time required for all grooming tasks. UB Dressing: Difficulty managing buttons LB Dressing: Less assist with pants with elastic, more assist with dress pants Toileting: Increased time for hygiene. Bathing: Requires increased time to complete Tub Shower transfers: Modified Independence with increased time  IADLs: Shopping: Son assists with grocery shopping Light housekeeping: Son's assist. Pt. Does light rising off of dishes. Meal Prep: Increased time. Independent with microwave, and air fryer. Difficulty opening bottles, containers Community mobility:  Transportation, light driving to grocery store Medication management:  Son's set-up weekly pillbox, Pt. Is responsible for taking the medications Financial management: Son's assist with monthly finances Handwriting: 50% legible Hobbies: Dancing, watching sports Career/work: Restaurant manager, fast food  MOBILITY STATUS: Uses a cane; shuffling gait  FUNCTIONAL OUTCOME MEASURES:  Mam-20 sum score: 53/80, MAM Measure score: 52.4   UPPER EXTREMITY ROM:    Active ROM Right eval Right 11/09/23 01/04/24 Left Eval [facility] overall  Shoulder flexion WFL [phone])   Shoulder abduction 52(100) 60(110) 31(881)  Shoulder adduction      Shoulder extension      Shoulder internal rotation      Shoulder external rotation      Elbow flexion [facility] WFL   Elbow extension [facility] WFL   Wrist flexion      Wrist extension [facility] WFL   Wrist ulnar deviation      Wrist  radial deviation      Wrist pronation      Wrist supination      (Blank rows = not tested)  UPPER EXTREMITY MMT:     MMT Right eval Right 11/09/23 Right 01/04/24 Left Eval 4-/5 overall Left 11/09/23 4/5 Overall  Shoulder flexion 3/5 3-/5 3-/5 3-/5   Shoulder abduction 2/5 2/5 2+/5 2+/5   Shoulder adduction       Shoulder extension       Shoulder internal rotation       Shoulder external rotation       Middle trapezius       Lower trapezius       Elbow flexion 3/5 3+/5 3+/5 3+/5   Elbow extension 3/5 3+5 4-/5 4-/5   Wrist flexion       Wrist extension 3/5 3/5 3/5 3/5   Wrist ulnar deviation       Wrist radial deviation       Wrist pronation       Wrist supination       (Blank rows = not tested)  HAND FUNCTION:   Eval: Grip strength: Right: 50 lbs; Left: 34 lbs, Lateral pinch: Right: 17 lbs, Left: 9 lbs, 3 point pinch: Right: 12 lbs, Left: 11 lbs  11/09/23: Grip strength: Right: 60 lbs; Left: 35 lbs, Lateral pinch: Right: 19 lbs, Left: 11 lbs, 3 point pinch: Right: 13 lbs, Left: 9 lbs  01/04/24:Grip strength: Right: 40 lbs; Left: 41 lbs, Lateral pinch: Right: 17 lbs, Left: 8 lbs, 3 point pinch: Right: 9 lbs, Left: 10 lbs  COORDINATION:  Eval: 9 Hole Peg test: Right: 1 min. & 46 sec; Left: 53 sec  11/09/2023: 9 Hole Peg test: Right: 1 min. & 22 sec; Left: 46 sec  01/04/24: 9 Hole Peg test: Right: 1 min. & 11 sec; Left: 46 sec   SENSATION:  Light touch: WFL Proprioception: WFL  EDEMA: N/A  COGNITION: Overall cognitive status: Within functional limits for tasks assessed  VISION:  Does not wear glasses.  VISION ASSESSMENT: To be further assessed in functional context  PERCEPTION: WFL  PRAXIS: Impaired: Motor planning                                                                                                                         TREATMENT DATE: 02/15/24  Therapeutic Ex:   -BUE strengthening through reciprocal motion using the SciFit for 10 min. at  resistance level 4.0 with constant monitoring of the BUEs. -Right shoulder AROM/PROM was performed with slow prolonged gentle stretching.  Manual Therapy:   -Soft  tissue massage was performed to the right scapular, and UE musculature 2/2 tightness. -Manual therapy was performed independent of, and in preparation for therapeutic Ex.     Self-care:  -Pt. Performed RUE AAROM/PROM hand to mouth patterns of movement in preparation using an adaptive spoon utensil. Pt. was able to grasp an adaptive large handled spoon with adjustable tip at a 90 degree angle and bring it to his mouth. Pt. was able to pick up 2-5 1/2 circular objects from bowl, using adaptive spoon, in preparation for performing hand to mouth patterns. Pt. performed scooping with adaptive large handled spoon and practiced movements needed in preparation for self-feeding with his right hand.    PATIENT EDUCATION: Education details: UE ROM, ADL functional status Person educated: Patient Education method: Explanation, Demonstration, Tactile cues, and Verbal cues Education comprehension: needs further education  HOME EXERCISE PROGRAM:  Continue to assess, and provide as indicated.   GOALS: Goals reviewed with patient? Yes  SHORT TERM GOALS: Target date: 02/15/2024     Pt. Will be independent with HEPs for RUE. Baseline: 01/04/24: Independent 11/09/2023: Independent Eval: No current HEP Goal status:Ongoing  LONG TERM GOALS: Target date: 03/28/2024  1.  Pt. Will increase right shoulder abduction by 10 degrees to assist with UE dressing Baseline: 01/04/24: 31(881) Pt. reports having donning his shirt is easier with his new routine 11/09/23: Right 60/(110) Eval: Right 52(100) Goal status: Ongoing  2.  Pt. Will increase BUE strength by 2 mm grades to assist with ADLs. Baseline: 01/04/24:  Right: shoulder flexion: 3-/5, abduction: 2/5, elbow flexion 4-/5, extension: 3+/5, wrist extension: 3/5. Left: 4/5 overallEval: Right: shoulder  flexion: 3/5, abduction: 2/5, elbow flexion/extension: 3/5, wrist extension: 3/5. Left: 4-/5 overall4/08/25: Right: shoulder flexion: 3-/5, abduction: 2/5, elbow flexion/extension: 3+/5, wrist extension: 3/5. Left: 4/5 overallEval: Right: shoulder flexion: 3/5, abduction: 2/5, elbow flexion/extension: 3/5, wrist extension: 3/5. Left: 4-/5 overall Goal status: Ongoing  3.  Pt. Will perform self-feeding with modified independence Baseline: 01/04/24: Pt. reports making improvements with self-feeding. Pt. has difficulty at times with bringing his spoon to his mouth, often bringing an empty spoon to his mouth 11/09/23: Pt. Continues to have difficulty using dominant right hand. Drops food when scooping, Both hands for stabilizing drinks, and uses a straw. Unable to cut food. Eval: Difficulty using dominant right hand. Drops food when scooping, Both hands for stabilizing drinks, and uses a straw. Unable to cut food. Goal status: Ongoing  4.  Pt. Will demonstrate independence with proper A/E techniques/compensatory strategies for ADL/IADLs.  Baseline: 01/04/24: Continue ongoing as needed. 11/09/23: Continue Eval: Education to be provided Goal status: Ongoing  5.  Pt. Will write a sentence with 75% legibility with modified independence Baseline: 01/04/24:  One sentence completed in 2 min. & 51 sec. With 50% legibility.11/09/2023: 50% legibility for one sentence. Eval: 50% legibility for one printed sentence Goal status: Ongoing   6.  Pt. Will improve bilateral [facility] skills by 3 sec. on the 9 Hole Peg Test to be able to manipulate small objects.  Baseline: 9 Hole Peg Test: R: 1 min. & 11 sec.  L: 46 sec. 11/09/2023: 9 Hole Peg test: Right: 1 min. & 22 sec; Left: 46 sec Eval: 9 Hole Peg test: Right: 1 min. & 46 sec; Left: 53 sec Goal status:New  7. Pt. Will improve the Mam-20 score by 5 points to reflect ADL/IADL improvement. Baseline: Mam-20 sum score: 53/80, Mam Measure score: 52.4 Goal status  New   ASSESSMENT:  CLINICAL IMPRESSION:  Pt. Tolerated treatment session well today. Pt. Presents with 5/10 pain during R shoulder flexion/abduction AAROM. Pt. Presents with stiffness in the R shoulder/scapular musculature, and tolerated shoulder mobilizations and soft tissue massage. Pt. Tolerated SciFit with resistance set at level 4. Pt. Has limited R Shoulder ROM due to increased pain with movement. However, Pt. Was able to demonstrate self-feeding, and hand to mouth patterns with RUE requiring AAROM. Pt. continues to benefit from OT services to work on improving overall bilateral UE functioning in order to maximize engagement in, and efficiency in ADLs, and IADL tasks, and provide education about compensatory strategies.    PERFORMANCE DEFICITS: in functional skills including ADLs, IADLs, coordination, dexterity, proprioception, sensation, ROM, strength, pain, Fine motor control, Gross motor control, and UE functional use, cognitive skills including , and psychosocial skills including coping strategies, environmental adaptation, and routines and behaviors.   IMPAIRMENTS: are limiting patient from ADLs, IADLs, and leisure.   CO-MORBIDITIES: may have co-morbidities  that affects occupational performance. Patient will benefit from skilled OT to address above impairments and improve overall function.  MODIFICATION OR ASSISTANCE TO COMPLETE EVALUATION: Min-Moderate modification of tasks or assist with assess necessary to complete an evaluation.  OT OCCUPATIONAL PROFILE AND HISTORY: Detailed assessment: Review of records and additional review of physical, cognitive, psychosocial history related to current functional performance.  CLINICAL DECISION MAKING: Moderate - several treatment options, min-mod task modification necessary  REHAB POTENTIAL: Good  EVALUATION COMPLEXITY: Moderate    PLAN:  OT FREQUENCY: 2x/week  OT DURATION: 12 weeks  PLANNED INTERVENTIONS: 97168 OT Re-evaluation,  97535 self care/ADL training, 02889 therapeutic exercise, 97530 therapeutic activity, 97112 neuromuscular re-education, 97140 manual therapy, 97018 paraffin, 02989 moist heat, 97034 contrast bath, 97760 Orthotics management and training, 02239 Splinting (initial encounter), energy conservation, patient/family education, and DME and/or AE instructions  RECOMMENDED OTHER SERVICES:   PT  CONSULTED AND AGREED WITH PLAN OF CARE: Patient  PLAN FOR NEXT SESSION:   Treatment  Damien Nap, OTS   Richardson Otter, MS, OTR/L   02/15/2024, 5:02 PM

## 2024-02-17 ENCOUNTER — Ambulatory Visit: Admitting: Occupational Therapy

## 2024-02-17 ENCOUNTER — Ambulatory Visit: Admitting: Physical Therapy

## 2024-02-17 ENCOUNTER — Telehealth: Payer: Self-pay | Admitting: Physical Therapy

## 2024-02-17 NOTE — Therapy (Incomplete)
 OUTPATIENT PHYSICAL THERAPY NEURO TREATMENT     Patient Name: Daniel Daugherty MRN: [id number] DOB:1966-07-24, 58 y.o., male Today's Date: 02/17/2024   PCP: Odell Chard, Edra GRADE, MD  REFERRING PROVIDER: Odell Chard, Edra GRADE, MD   END OF SESSION:  ***        Past Medical History:  Diagnosis Date   Hypertension    Past Surgical History:  Procedure Laterality Date   DG HAND LEFT COMPLETE ([facility] HX) Left 04/13/1999   There are no active problems to display for this patient.   ONSET DATE: 2022 is when he noticed the change and that was around the same time he was diagnosed with Parkinson's  REFERRING DIAG:  G20.A1 (ICD-10-CM) - Parkinson disease (HCC)  Z74.1 (ICD-10-CM) - Requires daily assistance for activities of daily living (ADL) and comfort needs   THERAPY DIAG: *** No diagnosis found.  Rationale for Evaluation and Treatment: Rehabilitation  SUBJECTIVE:                                                                                                                                                                                             SUBJECTIVE STATEMENT: TODAY 02/17/24: *** Pt reports he is doing okay, but today is one of those days. Reports moving a bit slower and slight lack of motivation but willingness to participate in therapy. Reports neck soreness but not as bad as yesterday.   PREVIOUSLY-  Pt reports he has been doing alright.  States his son turned 34y.o. this past Sunday and they had a cookout to celebrate. Pt reports he has gotten lazy since last session, not performing his HEP, and has been kicking back. However, later states he has been encouraging himself to stay active and walk every day using his walker as needed  Pt reports he still hasn't called Neurology office to set-up an appointment. Pt reports he stopped coming to therapy because he felt like he wasn't getting any better and it was discouraging. Reports he doesn't feel safe  trying to do the things at home that he does at therapy. Therapist educated pt that he is only to do the prescribed HEP at home and that the focus of therapy sessions is to challenge him to perform tasks that he is not safe to perform without a skilled physical therapist in order to advance his mobility.  Pt states he is ready to get better and he is frustrated with his CLOF. Pt reports he has looked up some things on social media for support and to find ideas on how to move more safely and independently. Pt sates the idea of the  [employer] group is intimidating to him and he is concerned he will get frustrated by his inability to do as well as other people in the group.   Pt sates he is trying to get dressed faster, but when he times himself he hasn't increased his speed. Pt states when he gets flustered and is trying to move fast, then his tremor increase and he has to stop and relax to get them to calm back down.  Pt states he knows he needs to keep coming to therapy, but he isn't improving as fast as he wants to. Pt states he doesn't want to keep missing out on life events because of his impairments. But he can't tell how the improvements he is making in therapy are improving his every day life. Despite this, pt in agreement to complete re-certification at this time to allow him to continue with therapy because he wants to get better. Therapist educated pt on goal to focus more on specific functional mobility tasks he is having difficulty with at home.  Pt accompanied by: self  PERTINENT HISTORY: Parkinsonism, Vitamin D deficiency, Hyperlipidemia, HTN, poor medication compliance (per chart review)  PAIN:  Are you having pain? Yes: NPRS scale: 5/10, but states it ain't that bad Pain location: R shoulder and low back Pain description: throbbing in shoulder Aggravating factors: overhead movements or reaching back Relieving factors: rest and at home uses a massager  PRECAUTIONS:  Fall  RED FLAGS: None   WEIGHT BEARING RESTRICTIONS: No  FALLS: Has patient fallen in last 6 months? Yes. 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   PATIENT GOALS: Get my balance right with my walking, help get my R arm stronger and be able to move it more, improve strength in my legs (specifically the Right)  OBJECTIVE:  Note: Objective measures were completed at Evaluation unless otherwise noted.  DIAGNOSTIC FINDINGS: N/A  COGNITION: Overall cognitive status: Within functional limits for tasks assessed; however, did not assess higher level cognitive tasks   SENSATION: WFL Light touch on screen Pt reports R foot falls asleep sitting on toilet and he has difficult time getting feeling back in it before feeling safe standing up.  COORDINATION: Overall bradykinesia with decreased speed and amplitude of movements  EDEMA:  None present  MUSCLE TONE: Not formally assessed  MUSCLE LENGTH: Not formally assessed, but may have some hamstring tightness on R associated with quad weakness and inability to achieve full knee extension in sitting  DTRs:  Not assessed  POSTURE: rounded shoulders, forward head, increased thoracic kyphosis, posterior pelvic tilt, and flexed trunk    LOWER EXTREMITY ROM:     Active ROM Right Eval Left Eval  Hip flexion Decreased in sitting compared to L   Hip extension     Hip abduction    Hip adduction    Hip internal rotation    Hip external rotation    Knee flexion    Knee extension Lacks full knee  extension in sitting   Ankle dorsiflexion Limited in sitting Limited in sitting  Ankle plantarflexion    Ankle inversion    Ankle eversion     (Blank rows = not tested)  LOWER EXTREMITY MMT:    MMT Right Eval Left Eval  Hip flexion 3- 4+  Hip extension    Hip abduction    Hip adduction    Hip internal rotation    Hip external rotation    Knee flexion 3 4+  Knee extension 3- 4+  Ankle dorsiflexion 3- 4+  Ankle plantarflexion 3- 4+  Ankle inversion    Ankle eversion    (Blank rows = not tested)  Manual Muscle Test Scale 0/5 = No muscle contraction can be seen or felt 1/5 = Contraction can be felt, but there is no motion 2-/5 = Part moves through incomplete ROM w/ gravity decreased 2/5 = Part moves through complete ROM w/ gravity decreased 2+/5 = Part moves through incomplete ROM (<50%) against gravity or through complete ROM w/ gravity 3-/5 = Part moves through incomplete ROM (>50%) against gravity 3/5 = Part moves through complete ROM against gravity 3+/5 = Part moves through complete ROM against gravity/slight resistance 4-/5= Holds test position against slight to moderate pressure 4/5 = Part moves through complete ROM against gravity/moderate resistance 4+/5= Holds test position against moderate to strong pressure 5/5 = Part moves through complete ROM against gravity/full resistance  BED MOBILITY:  Sit to supine   Supine to sit   Need to assess  ADL: Reports dificulty getitng on R shoe, specifically having trouble pushing his foot down in his shoe  TRANSFERS: Assistive device utilized: Hurry cane  Sit to stand: Modified independence and SBA Stand to sit: Modified independence and SBA Chair to chair: Modified independence and SBA Floor: would benefit from testing in future  RAMP:  Level of Assistance:   Assistive device  utilized: Hurry cane Ramp Comments: would benefit from assessing  CURB:  Level of Assistance:   Assistive device utilized: [employer] Comments: would benefit from assessing  STAIRS: Level of Assistance: CGA and Min A Stair Negotiation Technique: Step to Pattern Forwards with Bilateral Rails Number of Stairs: 4  Height of Stairs: 6in  Comments: leading with R LE in both directions, attempts reciprocal pattern on ascent with sufficient strength, but having uncontrolled anterior lean during descent  GAIT: Gait pattern: step through pattern, decreased arm swing- Right, decreased arm swing- Left, decreased step length- Right, decreased step length- Left, decreased stride length, decreased hip/knee flexion- Right, decreased hip/knee flexion- Left, decreased ankle dorsiflexion- Right, decreased ankle dorsiflexion- Left, decreased trunk rotation, trunk flexed, poor foot clearance- Right, and poor foot clearance- Left Distance walked: 165ft Assistive device utilized: None Level of assistance: CGA Comments: significantly decreased  FUNCTIONAL TESTS:  5 times sit to stand: 38.53 seconds arms across chest Timed up and go (TUG): 19.46 seconds without AD 6 minute walk test: 10/04/2023: 525ft (170m at 0.99m/s avg) 10 meter walk test: 0.56 m/s without AD Mini-Best: 10/04/2023: 8/28  PATIENT SURVEYS:  ABC scale 17.5% with pt feeling <40% confident on all of the items, pt feels only 30% confident walking around the house, 10% confident getting in/out of car, and 20% reaching for items  OCULOMOTOR EXAM from 11/16/2023 - performed due to Neurology note reporting concerns of decreased vertical gaze:  Ocular Alignment: normal  Ocular ROM: No Limitations  Spontaneous Nystagmus: absent  Gaze-Induced Nystagmus: absent  Smooth Pursuits: intact, good vertical movements with full range  Horizontal and Vertical Saccades: extra eye movements, slow, and frequently blinks, a few extra eye movements when moving  to superior target                                                                                                                               TREATMENT DATE: 02/17/24  ***  Unless otherwise stated, CGA was provided and gait belt donned in order to ensure pt safety throughout session.  TA- To improve functional movements patterns for everyday tasks    B UE and B LE reciprocal movement pattern on Octane for cardiovascular training and B LE functional strengthening against level 1 resistance for 3 minute, increased to level 2 for 3 minutes,  totaling 6 minutes and 0.41 mile, average of 30 SPM - goal to keep SPM >30 with pt benefiting from encouragement to maintain speed throughout with sustained attention on the task  STS from green chair, pushing up with hands-on knees to standing with bilateral shoulder extension c YTB for improved upright posture 2 x 10 - encourage loud verbalization during exercise.   Seated PWR step with reach outside BOS with visual cues for speed and range. 3x30. Visual cue of theraband on floor to encourage increase volume hip abduction during twist.  STS PWR Twist variant. Encourage pt to step before twisting to facilitate greater arm swing during twist. CGA x several reps   Gait training   No AD, pt and SPT holding SPC in each hand to facilitate reciprocal arm swing during gait. 242 ftx2. Seated rest break given    ***   PATIENT EDUCATION: Education details: exercise technique, reassessment  Person educated: Patient Education method: Explanation, demo, vc Education comprehension: verbalized understanding and needs further education  HOME EXERCISE PROGRAM:  Access Code: [email or web address] URL: [email or web address] Date: 12/02/2023 Prepared by: Connell Kiss  Exercises - Sidelying Thoracic Rotation with Open Book  - 1 x daily - 7 x weekly - 2 sets - 10 reps - Supine Shoulder External Rotation in Abduction  - 1 x daily - 7 x weekly - 2 sets -  10 reps - 10 seconds hold - Seated Scapular Retraction  - 1 x daily - 7 x weekly - 3 sets - 10 reps - 3 seconds hold - Sit to Stand with Arm Swing  - 1 x daily - 7 x weekly - 2 sets - 10 reps   GOALS: Goals reviewed with patient? Yes  SHORT TERM GOALS: Target date: 02/15/2024    Patient will be independent in home exercise program to improve strength/mobility for better functional independence with ADLs. Baseline: need to initiate 10/07/2023: provided 12/02/2023: updated Goal status: IN PROGRESS   LONG TERM GOALS: Target date: 03/28/2024   1.  Patient (< 44 years old) will complete five times sit to stand test in < 12 seconds indicating an increased LE strength and improved balance. Baseline: 09/28/23: 38.53 seconds 11/09/2023: 22.13 seconds with arms across chest  01/04/2024: 20.12 seconds without UE support Goal status: IN PROGRESS   2. Patient will reduce timed up and go to <11 seconds to reduce fall risk and demonstrate improved transfer/gait ability. Baseline: 09/28/23: 19.46 seconds without AD 11/09/2023: 16.96 seconds, no AD, with close SBA for balance safety 01/04/2024: 15.845 seconds, no AD with close SBA for safety Goal status: IN PROGRESS  3.  Patient will increase 10 meter walk test to >1.13m/s as to improve gait speed for better community ambulation and to reduce fall risk. Baseline: 09/28/23: 0.34m/s without AD 11/09/2023: 0.89 m/s (11.50 seconds seconds 10.94 seconds) no AD, with close SBA for safety 01/04/2024: 0.935 m/s without AD, CGA/close SBA for safety Goal status: IN PROGRESS  4.  Patient will increase six minute walk test distance to >1077ft for progression to community ambulator and improve gait ability Baseline: 10/04/2023: 556ft (156m at 0.70m/s avg) 11/09/2023: 1061 feet (323 meters, Avg speed 0.897 m/s) without AD Goal status: MET and Advanced 11/09/2023 Goal Updated: distance > 1397ft 01/13/2024: 832  ft mostly without SPC but then pt needed SPC due to fatigue, required one  break (standing)   5. Patient will increase MiniBest Test score to >18/28 to indicate a reduced risk for falling and demonstrate increased independence with functional mobility and ADLs.  Baseline: 10/04/2023: 8/28  11/16/2023: 16/28  01/22/2024: deferred 6/17: 20/28  Goal status: MET  6. Patient will increase ABC scale score >80% to demonstrate better functional mobility and better confidence with ADLs.   Baseline: 09/28/23: 17.5% 11/09/2023: 21.25% - pt reports he rated the test as is if he was not using his walker because this is his long term goal  01/04/2024: 35.625% - continuing to rate it as if he is not using his walker   Goal status: IN PROGRESS   ASSESSMENT:  CLINICAL IMPRESSION:  *** Pt arrived to therapy with good motivation for PT activities. Therapy continued to focus on large volume movements and speed of the movement. Pt was able to avg 30 SPM on octane today when cued for speed. Pt displayed increased ROM and speed during pwr moves today when emphasizing verbalizations during the intervention. Pt displayed 1 LOB during PWR twists when swinging and stepping to his right. Pt was able to sit back in his seat to avoid falling using B UE support and SPT assist. Pt will continue to benefit from skilled physical therapy intervention to address impairments, improve QOL, and attain therapy goals.       OBJECTIVE IMPAIRMENTS: Abnormal gait, decreased activity tolerance, decreased balance, decreased coordination, decreased endurance, decreased knowledge of condition, decreased knowledge of use of DME, decreased mobility, difficulty walking, decreased ROM, decreased strength, decreased safety awareness, improper body mechanics, postural dysfunction, and pain.   ACTIVITY LIMITATIONS: carrying, lifting, bending, standing, squatting, sleeping, stairs, transfers, bed mobility, continence, bathing, toileting, dressing, reach over head, hygiene/grooming, and locomotion level  PARTICIPATION  LIMITATIONS: meal prep, cleaning, laundry, and community activity  PERSONAL FACTORS: Fitness and 3+ comorbidities: Vitamin D deficiency, Hyperlipidemia, and HTN are also affecting patient's functional outcome.   REHAB POTENTIAL: Good  CLINICAL DECISION MAKING: Evolving/moderate complexity  EVALUATION COMPLEXITY: Moderate  PLAN:  PT FREQUENCY: 1-2x/week  PT DURATION: 12 weeks  PLANNED INTERVENTIONS: 97164- PT Re-evaluation, 97110-Therapeutic exercises, 97530- Therapeutic activity, 97112- Neuromuscular re-education, 97535- Self Care, [id number]- Manual therapy, [phone]- Gait training, [phone]- Orthotic Fit/training, [phone]- Canalith repositioning, [phone]- Electrical stimulation (manual), Patient/Family education, Balance training, Stair training, Joint mobilization, Vestibular training, Visual/preceptual remediation/compensation, DME instructions, Cryotherapy, and Moist heat  PLAN FOR NEXT SESSION: *** **focus on functional mobility tasks patient reports as challenging** - gait training with increased intensity - dynamic gait with focus on increased upright posture and speed of ambulation with arm swing (add AWs to UEs with boxing gloves)  - increased focus on turns  - continue arm reaching and trunk rotation (specifically to R) - stepping strategy re-training: focus on posterior stepping - large amplitude UE movements with promotion of improved upright posture   Severa Jeremiah, PT, DPT, NCS, CSRS Physical Therapist - [facility]  12:47 PM 02/17/24

## 2024-02-17 NOTE — Telephone Encounter (Signed)
 Pt contacted via telephone due to missed appointment and pt reports he wasn't able to secure a ride. Therapist made pt aware of future PT appointment date and time.   Connell Kiss, PT, DPT, NCS, CSRS

## 2024-02-22 ENCOUNTER — Ambulatory Visit

## 2024-02-24 ENCOUNTER — Ambulatory Visit

## 2024-02-24 ENCOUNTER — Ambulatory Visit: Admitting: Occupational Therapy

## 2024-02-29 ENCOUNTER — Ambulatory Visit: Admitting: Occupational Therapy

## 2024-02-29 ENCOUNTER — Ambulatory Visit

## 2024-03-02 ENCOUNTER — Ambulatory Visit

## 2024-03-02 ENCOUNTER — Ambulatory Visit: Admitting: Occupational Therapy

## 2024-03-07 ENCOUNTER — Telehealth: Payer: Self-pay

## 2024-03-07 ENCOUNTER — Ambulatory Visit: Attending: Family Medicine

## 2024-03-07 ENCOUNTER — Ambulatory Visit

## 2024-03-07 DIAGNOSIS — R278 Other lack of coordination: Secondary | ICD-10-CM | POA: Insufficient documentation

## 2024-03-07 DIAGNOSIS — R531 Weakness: Secondary | ICD-10-CM | POA: Insufficient documentation

## 2024-03-07 DIAGNOSIS — R2681 Unsteadiness on feet: Secondary | ICD-10-CM | POA: Insufficient documentation

## 2024-03-07 DIAGNOSIS — R269 Unspecified abnormalities of gait and mobility: Secondary | ICD-10-CM | POA: Insufficient documentation

## 2024-03-07 DIAGNOSIS — R262 Difficulty in walking, not elsewhere classified: Secondary | ICD-10-CM | POA: Insufficient documentation

## 2024-03-07 DIAGNOSIS — M6281 Muscle weakness (generalized): Secondary | ICD-10-CM | POA: Insufficient documentation

## 2024-03-07 NOTE — Telephone Encounter (Signed)
 Pt notified of missed visit, and reported he did not have transportation this morning. OT reminded pt to call for any cancellations, and informed pt of upcoming therapy appt this week.  Pt reports he will be able to attend his next session.  Inocente Blazing, MS, OTR/L

## 2024-03-09 ENCOUNTER — Ambulatory Visit

## 2024-03-14 ENCOUNTER — Ambulatory Visit

## 2024-03-14 ENCOUNTER — Telehealth: Payer: Self-pay

## 2024-03-14 DIAGNOSIS — R269 Unspecified abnormalities of gait and mobility: Secondary | ICD-10-CM

## 2024-03-14 DIAGNOSIS — M6281 Muscle weakness (generalized): Secondary | ICD-10-CM | POA: Diagnosis present

## 2024-03-14 DIAGNOSIS — R531 Weakness: Secondary | ICD-10-CM | POA: Diagnosis present

## 2024-03-14 DIAGNOSIS — R262 Difficulty in walking, not elsewhere classified: Secondary | ICD-10-CM | POA: Diagnosis present

## 2024-03-14 DIAGNOSIS — R2681 Unsteadiness on feet: Secondary | ICD-10-CM | POA: Diagnosis present

## 2024-03-14 DIAGNOSIS — R278 Other lack of coordination: Secondary | ICD-10-CM | POA: Diagnosis present

## 2024-03-14 NOTE — Therapy (Addendum)
 OUTPATIENT PHYSICAL THERAPY TREATMENT  Patient Name: Daniel Daugherty MRN: [id number] DOB:[birth year], 58 y.o., male Today's Date: 03/14/2024  PCP: Odell Chard, Edra GRADE, MD  REFERRING PROVIDER: Odell Chard, Edra GRADE, MD   END OF SESSION:    PT End of Session - 03/14/24 1102     Visit Number 28    Number of Visits 43    Date for PT Re-Evaluation 03/28/24    Authorization Type Medicare 2025    Progress Note Due on Visit 30    PT Start Time 1100    PT Stop Time 1140    PT Time Calculation (min) 40 min    Equipment Utilized During Treatment Gait belt    Activity Tolerance Patient tolerated treatment well    Behavior During Therapy WFL for tasks assessed/performed           Past Medical History:  Diagnosis Date   Hypertension    Past Surgical History:  Procedure Laterality Date   DG HAND LEFT COMPLETE ([facility] HX) Left 04/13/1999   There are no active problems to display for this patient.  ONSET DATE: 2022 is when he noticed the change and that was around the same time he was diagnosed with Parkinson's  REFERRING DIAG:  G20.A1 (ICD-10-CM) - Parkinson disease (HCC)  Z74.1 (ICD-10-CM) - Requires daily assistance for activities of daily living (ADL) and comfort needs   THERAPY DIAG:  Muscle weakness (generalized)  Difficulty in walking, not elsewhere classified  Generalized weakness  Abnormality of gait  Rationale for Evaluation and Treatment: Rehabilitation  SUBJECTIVE:                                                                                                                                                                                             SUBJECTIVE STATEMENT: Pt returns after several weeks hiatus. Reports he was on a cruise recently and had a good time overall. No other notable updates.   PERTINENT HISTORY: Parkinsonism, Vitamin D deficiency, Hyperlipidemia, HTN, poor medication compliance (per chart review)  PAIN:  Are you having pain? Rt  shoulder/neck 4-5/10.   PRECAUTIONS: Fall  WEIGHT BEARING RESTRICTIONS: No  FALLS: Has patient fallen in last 6 months? Yes. 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   PATIENT GOALS: Get my balance right with my walking, help get my R arm stronger and be able to move it more, improve strength in my legs (specifically the Right)  OBJECTIVE:  Note: Objective measures were completed at Evaluation unless otherwise noted.                                                                                                                            TREATMENT DATE: 03/14/24 -AMB overground c left forearm cane x979ft over lower level  -education about need to become involved in fitness regimen outside of PT prior to DC for ultimate outcomes and post DC continued fitness/mobility *sounds as though pt may have some transportation limitations, but is considering [employer] near his home  -STS from chair x15 hands free, ataxic without frank LOB  *pt encouraged to count his reps as to prepare for best performance in HEP and return to gym setting; pt expresses misunderstanding over who was keeping track of reps and expresses clear feelings of disrespect and discontent with the way he was spoken to; sharp changes in the friendly engagement in session at this point as pt expresses a desire to not work with Chartered loss adjuster again.  -STS from chair x10  hands free, ataxic without frank LOB -AMB overground x21ft  -STS from chair x10 -AMB overground x225ft  -AMB overground x250ft  *pt declines additional activities with greater resistance as he is upset with Chartered loss adjuster and frustrated.   PATIENT EDUCATION: Education details: pt's concurrently participating in a fitness program outside of PT demonstrated improved outcomes overall.  Person educated: Patient Education method: collaborative learning, deliberate practice, positive reinforcement, explicit instruction, establish rules. Education comprehension: verbalized understanding and needs further education  HOME EXERCISE PROGRAM: Access Code: [email or web address] URL: [email or web address] Date: 12/02/2023 Prepared by: Connell Kiss  Exercises - Sidelying Thoracic Rotation with Open Book  - 1 x daily - 7 x weekly - 2 sets - 10 reps - Supine Shoulder External Rotation in Abduction  - 1 x daily - 7 x weekly - 2 sets - 10 reps - 10 seconds hold - Seated Scapular Retraction  - 1 x daily - 7 x weekly - 3 sets - 10 reps - 3 seconds hold - Sit to Stand with Arm Swing  - 1 x daily - 7 x weekly - 2 sets - 10 reps  GOALS: Goals reviewed with patient? Yes  SHORT TERM GOALS: Target date: 02/15/2024  Patient will be independent in home exercise program to improve strength/mobility for better functional independence with ADLs. Baseline: need to initiate; 10/07/2023: provided; 12/02/2023: updated Goal status: IN PROGRESS  LONG TERM GOALS: Target date: 03/28/2024  1.  Patient (< 39 years old) will complete five times sit to stand test in < 12 seconds indicating an increased LE strength and improved balance. Baseline:  09/28/23: 38.53 seconds; 11/09/2023: 22.13 seconds with arms across chest; 01/04/2024: 20.12 seconds without UE support Goal status: IN PROGRESS   2. Patient will reduce timed up and go to <11 seconds to reduce fall risk and demonstrate improved transfer/gait ability. Baseline: 09/28/23: 19.46  seconds without AD; 11/09/2023: 16.96 seconds, no AD, with close SBA for balance safety; 01/04/2024: 15.845 seconds, no AD with close SBA for safety Goal status: IN PROGRESS  3.  Patient will increase 10 meter walk test to >1.27m/s as to improve gait speed for better community ambulation and to reduce fall risk. Baseline: 09/28/23: 0.9m/s without AD; 11/09/2023: 0.89 m/s (11.50 seconds seconds 10.94 seconds) no AD, with close SBA for safety; 01/04/2024: 0.935 m/s without AD, CGA/close SBA for safety Goal status: IN PROGRESS  4.  Patient will increase six minute walk test distance to >1043ft for progression to community ambulator and improve gait ability 11/09/2023 Goal Updated: distance > 1340ft Baseline: 10/04/2023: 525ft (153m at 0.10m/s avg); 11/09/2023: 1061 feet (323 meters, Avg speed 0.897 m/s) without AD Goal status: MET and Advanced 01/13/2024: 832  ft mostly without SPC but then pt needed SPC due to fatigue, required one break (standing)  5. Patient will increase MiniBest Test score to >18/28 to indicate a reduced risk for falling and demonstrate increased independence with functional mobility and ADLs.  Baseline: 10/04/2023: 8/28; 11/16/2023: 16/28; 01/22/2024: deferred; 6/17: 20/28  Goal status: MET  6. Patient will increase ABC scale score >80% to demonstrate better functional mobility and better confidence with ADLs.   Baseline: 09/28/23: 17.5%; 11/09/2023: 21.25% - pt reports he rated the test as is if he was not using his walker because this is his long term goal; 01/04/2024: 35.625% - continuing to rate it as if he is not using his walker  Goal status: IN PROGRESS  ASSESSMENT: CLINICAL IMPRESSION:  Pt returning after hiatus several weeks, overall is doing very well with AMB and transfers considering his absence. Pt also reports successful participation in family cruise with good problem solving of mobility needs. Continued to work on patient's longer distance walking LT goals. Pt educated on importance  of regular physical fitness outside of PT and encouraged to rejoin the local [employer] given familiarity. Unfortunately pt becomes offended by feedback from author at around 25 minutes into session and has a sharp change in affect, expresses feelings of disrespect, intention to avoid working with Chartered loss adjuster in future. Attempts are made to recovery service, offer apology, keep focus on pt's progress and the value of a PT session, however these seem only slightly affective overall. Session completed and pt taken over to OT. Patient will benefit from skilled physical therapy intervention to reduce deficits and impairments identified in evaluation, in order to reduce pain, improve quality of life, and maximize activity tolerance for ADL, IADL, and leisure/fitness. Physical therapy will help pt achieve long and short term goals of care.   OBJECTIVE IMPAIRMENTS: Abnormal gait, decreased activity tolerance, decreased balance, decreased coordination, decreased endurance, decreased knowledge of condition, decreased knowledge of use of DME, decreased mobility, difficulty walking, decreased ROM, decreased strength, decreased safety awareness, improper body mechanics, postural dysfunction, and pain.   ACTIVITY LIMITATIONS: carrying, lifting, bending, standing, squatting, sleeping, stairs, transfers, bed mobility, continence, bathing, toileting, dressing, reach over head, hygiene/grooming, and locomotion level  PARTICIPATION LIMITATIONS: meal prep, cleaning, laundry, and community activity  PERSONAL FACTORS: Fitness and 3+ comorbidities: Vitamin D deficiency, Hyperlipidemia, and HTN are also affecting patient's functional outcome.   REHAB POTENTIAL: Good  CLINICAL  DECISION MAKING: Evolving/moderate complexity  EVALUATION COMPLEXITY: Moderate  PLAN:  PT FREQUENCY: 1-2x/week  PT DURATION: 12 weeks  PLANNED INTERVENTIONS: 97164- PT Re-evaluation, 97110-Therapeutic exercises, 97530- Therapeutic activity, 97112-  Neuromuscular re-education, 97535- Self Care, 02859- Manual therapy, [phone]- Gait training, [phone]- Orthotic Fit/training, [phone]- Canalith repositioning, [phone]- Electrical stimulation (manual), Patient/Family education, Balance training, Stair training, Joint mobilization, Vestibular training, Visual/preceptual remediation/compensation, DME instructions, Cryotherapy, and Moist heat  PLAN FOR NEXT SESSION:    5:18 PM, 03/16/24 Peggye JAYSON Linear, PT, DPT Physical Therapist - [facility]  Outpatient Physical Therapy- Main Campus [phone]

## 2024-03-14 NOTE — Therapy (Signed)
 Occupational Therapy Treatment Note   Patient Name: Daniel Daugherty MRN: [id number] DOB:1965-09-22, 58 y.o., male Today's Date: 03/16/2024  PCP: N/A REFERRING PROVIDER: Odell Tor Edra CINDERELLA, MD  END OF SESSION:  OT End of Session - 03/15/24 1014     Visit Number 28    Number of Visits 48    Date for OT Re-Evaluation 03/28/24    OT Start Time 1146    OT Stop Time 1233    OT Time Calculation (min) 47 min    Activity Tolerance Patient tolerated treatment well    Behavior During Therapy WFL for tasks assessed/performed               Past Medical History:  Diagnosis Date   Hypertension    Past Surgical History:  Procedure Laterality Date   DG HAND LEFT COMPLETE ([facility] HX) Left 04/13/1999   There are no active problems to display for this patient.   ONSET DATE: 05/2021  REFERRING DIAG: Parkinson's Disease  THERAPY DIAG:  Muscle weakness (generalized)  Difficulty in walking, not elsewhere classified  Generalized weakness  Rationale for Evaluation and Treatment: Rehabilitation  SUBJECTIVE:   SUBJECTIVE STATEMENT:  See below for subjective in treatment notes Pt accompanied by: self  PERTINENT HISTORY:   Pt. is a 58 y.o. male who was diagnosed with Parkinson's Disease in 2022. Pt. reports having had a progressive decline in function since having hernia surgery, and right shoulder rotator cuff surgery. PMHx included: HTN. Of note; Pt. was being followed by [facility], and receiving Home health therapy services. Pt. changed physicians, and was referred for outpatient rehab services here.  PRECAUTIONS: None  WEIGHT BEARING RESTRICTIONS: No  PAIN:  Are you having pain? 2/10 Right neck, shoulder.  FALLS: Has patient fallen in last 6 months? No, 1 small slip near the bed tripped over shoes  LIVING ENVIRONMENT: Lives with: lives alone Lives in: [place],  Stairs: Entry level Has following equipment at home: Quad cane small base, rollator, shower chair, grab bars,  hand rail at [employer]  PLOF: Independent  PATIENT GOALS: Improve self-care  OBJECTIVE:  Note: Objective measures were completed at Evaluation unless otherwise noted.  HAND DOMINANCE: Right  ADLs:  Assist at home:  Ex wife, and children assist several times a week  Transfers/ambulation related to ADLs: Modified Independent Eating: Difficulty using dominant right hand. Drops food when scooping, Both hands for stabilizing drinks, and uses a straw. Unable to cut food.  Grooming: Increased time required for all grooming tasks. UB Dressing: Difficulty managing buttons LB Dressing: Less assist with pants with elastic, more assist with dress pants Toileting: Increased time for hygiene. Bathing: Requires increased time to complete Tub Shower transfers: Modified Independence with increased time  IADLs: Shopping: Son assists with grocery shopping Light housekeeping: Son's assist. Pt. Does light rising off of dishes. Meal Prep: Increased time. Independent with microwave, and air fryer. Difficulty opening bottles, containers Community mobility:  Transportation, light driving to grocery store Medication management:  Son's set-up weekly pillbox, Pt. Is responsible for taking the medications Financial management: Son's assist with monthly finances Handwriting: 50% legible Hobbies: Dancing, watching sports Career/work: Restaurant manager, fast food  MOBILITY STATUS: Uses a cane; shuffling gait  FUNCTIONAL OUTCOME MEASURES:  Mam-20 sum score: 53/80, MAM Measure score: 52.4   UPPER EXTREMITY ROM:    Active ROM Right eval Right 11/09/23 01/04/24 Left Eval [facility] overall  Shoulder flexion WFL 120(134) 873(862)   Shoulder abduction 52(100) 60(110) 31(881)   Shoulder adduction  Shoulder extension      Shoulder internal rotation      Shoulder external rotation      Elbow flexion [facility] WFL   Elbow extension [facility] WFL   Wrist flexion      Wrist extension [facility] WFL   Wrist ulnar deviation       Wrist radial deviation      Wrist pronation      Wrist supination      (Blank rows = not tested)  UPPER EXTREMITY MMT:     MMT Right eval Right 11/09/23 Right 01/04/24 Left Eval 4-/5 overall Left 11/09/23 4/5 Overall  Shoulder flexion 3/5 3-/5 3-/5 3-/5   Shoulder abduction 2/5 2/5 2+/5 2+/5   Shoulder adduction       Shoulder extension       Shoulder internal rotation       Shoulder external rotation       Middle trapezius       Lower trapezius       Elbow flexion 3/5 3+/5 3+/5 3+/5   Elbow extension 3/5 3+5 4-/5 4-/5   Wrist flexion       Wrist extension 3/5 3/5 3/5 3/5   Wrist ulnar deviation       Wrist radial deviation       Wrist pronation       Wrist supination       (Blank rows = not tested)  HAND FUNCTION:   Eval: Grip strength: Right: 50 lbs; Left: 34 lbs, Lateral pinch: Right: 17 lbs, Left: 9 lbs, 3 point pinch: Right: 12 lbs, Left: 11 lbs  11/09/23: Grip strength: Right: 60 lbs; Left: 35 lbs, Lateral pinch: Right: 19 lbs, Left: 11 lbs, 3 point pinch: Right: 13 lbs, Left: 9 lbs  01/04/24:Grip strength: Right: 40 lbs; Left: 41 lbs, Lateral pinch: Right: 17 lbs, Left: 8 lbs, 3 point pinch: Right: 9 lbs, Left: 10 lbs  COORDINATION:  Eval: 9 Hole Peg test: Right: 1 min. & 46 sec; Left: 53 sec  11/09/2023: 9 Hole Peg test: Right: 1 min. & 22 sec; Left: 46 sec  01/04/24: 9 Hole Peg test: Right: 1 min. & 11 sec; Left: 46 sec   SENSATION:  Light touch: WFL Proprioception: WFL  EDEMA: N/A  COGNITION: Overall cognitive status: Within functional limits for tasks assessed  VISION:  Does not wear glasses.  VISION ASSESSMENT: To be further assessed in functional context  PERCEPTION: WFL  PRAXIS: Impaired: Motor planning                                                                                                                         TREATMENT DATE: 03/14/24  Pt reports he went on a cruise with his family and had a good time.  He was able to get  into the ocean but had trouble with moving with the sand on his feet.  It was a lot of walking.    Therapeutic Activities: Pt  seen this date with focus on functional reaching patterns.  Use of SAEBO ball tower on tabletop with looped balls from seated position to reach, move balls towards multi levels.  Pt able to complete level one and two, therapist guiding required with level two, manual stretch and facilitation at the right scapula for upwards rotation with reaching patterns.  Increased time and effort to complete task.  Use of dowel on table for dowel roll forwards and back to encourage forward flexion pattern of reach, increased difficulty and some pain noted.  Switched to tabletop wiping with washcloth with small circles, clockwise and counter clockwise with occasional hand over hand assist for patterns.   Pt seen for handwriting with use of large red grip pen to formulate name and address with fair legibility.  Pt worked on stroke formation of letters with cues for increasing amplitude of letters.  With second round of writing his name after working on stroke formation, he was able to demonstrate significant progress in the size of the letters and legibility of his name.  Added hand flicks prior to performing handwriting to encourage finger extension.        PATIENT EDUCATION: Education details: UE ROM, ADL functional status Person educated: Patient Education method: Explanation, Demonstration, Tactile cues, and Verbal cues Education comprehension: needs further education  HOME EXERCISE PROGRAM:  Continue to assess, and provide as indicated.   GOALS: Goals reviewed with patient? Yes  SHORT TERM GOALS: Target date: 02/15/2024     Pt. Will be independent with HEPs for RUE. Baseline: 01/04/24: Independent 11/09/2023: Independent Eval: No current HEP Goal status:Ongoing  LONG TERM GOALS: Target date: 03/28/2024  1.  Pt. Will increase right shoulder abduction by 10 degrees to assist  with UE dressing Baseline: 01/04/24: 31(881) Pt. reports having donning his shirt is easier with his new routine 11/09/23: Right 60/(110) Eval: Right 52(100) Goal status: Ongoing  2.  Pt. Will increase BUE strength by 2 mm grades to assist with ADLs. Baseline: 01/04/24:  Right: shoulder flexion: 3-/5, abduction: 2/5, elbow flexion 4-/5, extension: 3+/5, wrist extension: 3/5. Left: 4/5 overallEval: Right: shoulder flexion: 3/5, abduction: 2/5, elbow flexion/extension: 3/5, wrist extension: 3/5. Left: 4-/5 overall4/08/25: Right: shoulder flexion: 3-/5, abduction: 2/5, elbow flexion/extension: 3+/5, wrist extension: 3/5. Left: 4/5 overallEval: Right: shoulder flexion: 3/5, abduction: 2/5, elbow flexion/extension: 3/5, wrist extension: 3/5. Left: 4-/5 overall Goal status: Ongoing  3.  Pt. Will perform self-feeding with modified independence Baseline: 01/04/24: Pt. reports making improvements with self-feeding. Pt. has difficulty at times with bringing his spoon to his mouth, often bringing an empty spoon to his mouth 11/09/23: Pt. Continues to have difficulty using dominant right hand. Drops food when scooping, Both hands for stabilizing drinks, and uses a straw. Unable to cut food. Eval: Difficulty using dominant right hand. Drops food when scooping, Both hands for stabilizing drinks, and uses a straw. Unable to cut food. Goal status: Ongoing  4.  Pt. Will demonstrate independence with proper A/E techniques/compensatory strategies for ADL/IADLs.  Baseline: 01/04/24: Continue ongoing as needed. 11/09/23: Continue Eval: Education to be provided Goal status: Ongoing  5.  Pt. Will write a sentence with 75% legibility with modified independence Baseline: 01/04/24:  One sentence completed in 2 min. & 51 sec. With 50% legibility.11/09/2023: 50% legibility for one sentence. Eval: 50% legibility for one printed sentence Goal status: Ongoing   6.  Pt. Will improve bilateral [facility] skills by 3 sec. on the 9 Hole Peg Test  to be able to manipulate  small objects.  Baseline: 9 Hole Peg Test: R: 1 min. & 11 sec.  L: 46 sec. 11/09/2023: 9 Hole Peg test: Right: 1 min. & 22 sec; Left: 46 sec Eval: 9 Hole Peg test: Right: 1 min. & 46 sec; Left: 53 sec Goal status:New  7. Pt. Will improve the Mam-20 score by 5 points to reflect ADL/IADL improvement. Baseline: Mam-20 sum score: 53/80, Mam Measure score: 52.4 Goal status New   ASSESSMENT:  CLINICAL IMPRESSION:  Pt. Continues to demonstrate increased pain at times in right shoulder as well as limitations with scapular motion and shoulder active range of motion for reaching.  He benefits from manual stretching and mobilization to the scapula to facilitate increased reach.  Pt requires increased time with increased effort with tasks during session.  He performed well with use of hand flicks prior to handwriting and focus on stroke formation of letters with emphasis on amplitude/size of movement patterns when forming letters. Utilized large red grip pen this date with fair to good legibility during session.  Pt has a flat affect but smiled and laughed during session while talking about his cruise with his family.   Pt. continues to benefit from OT services to work on improving overall bilateral UE functioning in order to maximize engagement in, and efficiency in ADLs, and IADL tasks, and provide education about compensatory strategies.    PERFORMANCE DEFICITS: in functional skills including ADLs, IADLs, coordination, dexterity, proprioception, sensation, ROM, strength, pain, Fine motor control, Gross motor control, and UE functional use, cognitive skills including , and psychosocial skills including coping strategies, environmental adaptation, and routines and behaviors.   IMPAIRMENTS: are limiting patient from ADLs, IADLs, and leisure.   CO-MORBIDITIES: may have co-morbidities  that affects occupational performance. Patient will benefit from skilled OT to address above  impairments and improve overall function.  MODIFICATION OR ASSISTANCE TO COMPLETE EVALUATION: Min-Moderate modification of tasks or assist with assess necessary to complete an evaluation.  OT OCCUPATIONAL PROFILE AND HISTORY: Detailed assessment: Review of records and additional review of physical, cognitive, psychosocial history related to current functional performance.  CLINICAL DECISION MAKING: Moderate - several treatment options, min-mod task modification necessary  REHAB POTENTIAL: Good  EVALUATION COMPLEXITY: Moderate    PLAN:  OT FREQUENCY: 2x/week  OT DURATION: 12 weeks  PLANNED INTERVENTIONS: 97168 OT Re-evaluation, 97535 self care/ADL training, 02889 therapeutic exercise, 97530 therapeutic activity, 97112 neuromuscular re-education, 97140 manual therapy, 97018 paraffin, 02989 moist heat, 97034 contrast bath, 97760 Orthotics management and training, 02239 Splinting (initial encounter), energy conservation, patient/family education, and DME and/or AE instructions  RECOMMENDED OTHER SERVICES:   PT  CONSULTED AND AGREED WITH PLAN OF CARE: Patient  PLAN FOR NEXT SESSION:   Treatment  Shermar Friedland T Lennix Kneisel, OTR/L, CLT  03/16/2024, 10:15 AM

## 2024-03-14 NOTE — Telephone Encounter (Signed)
 Author reached out to confirm pt's transportation for today's visits and confirm his appointment times. Pt attests to both. Will plan on seeing pt as scheduled later today.   8:38 AM, 03/14/24 Peggye JAYSON Linear, PT, DPT Physical Therapist - [facility]  Outpatient Physical Therapy- Main Campus [phone]

## 2024-03-16 ENCOUNTER — Ambulatory Visit: Admitting: Occupational Therapy

## 2024-03-16 ENCOUNTER — Encounter: Payer: Self-pay | Admitting: Occupational Therapy

## 2024-03-16 ENCOUNTER — Ambulatory Visit: Admitting: Physical Therapy

## 2024-03-16 DIAGNOSIS — R269 Unspecified abnormalities of gait and mobility: Secondary | ICD-10-CM

## 2024-03-16 DIAGNOSIS — M6281 Muscle weakness (generalized): Secondary | ICD-10-CM

## 2024-03-16 DIAGNOSIS — R278 Other lack of coordination: Secondary | ICD-10-CM

## 2024-03-16 DIAGNOSIS — R531 Weakness: Secondary | ICD-10-CM

## 2024-03-16 DIAGNOSIS — R262 Difficulty in walking, not elsewhere classified: Secondary | ICD-10-CM

## 2024-03-16 DIAGNOSIS — R2681 Unsteadiness on feet: Secondary | ICD-10-CM

## 2024-03-16 NOTE — Therapy (Signed)
 Occupational Therapy Treatment Note   Patient Name: Daniel Daugherty MRN: [id number] DOB:[birth year], 58 y.o., male Today's Date: 03/16/2024  PCP: N/A REFERRING PROVIDER: Odell Tor Edra CINDERELLA, MD  END OF SESSION:  OT End of Session - 03/16/24 1401     Visit Number 29    Number of Visits 48    Date for OT Re-Evaluation 03/28/24    OT Start Time 1400    OT Stop Time 1445    OT Time Calculation (min) 45 min    Activity Tolerance Patient tolerated treatment well    Behavior During Therapy WFL for tasks assessed/performed               Past Medical History:  Diagnosis Date   Hypertension    Past Surgical History:  Procedure Laterality Date   DG HAND LEFT COMPLETE ([facility] HX) Left 04/13/1999   There are no active problems to display for this patient.   ONSET DATE: 05/2021  REFERRING DIAG: Parkinson's Disease  THERAPY DIAG:  Other lack of coordination  Muscle weakness (generalized)  Rationale for Evaluation and Treatment: Rehabilitation  SUBJECTIVE:   SUBJECTIVE STATEMENT:  Pt reports he is excited for school to start for the kids.  Pt accompanied by: self  PERTINENT HISTORY:   Pt. is a 58 y.o. male who was diagnosed with Parkinson's Disease in 2022. Pt. reports having had a progressive decline in function since having hernia surgery, and right shoulder rotator cuff surgery. PMHx included: HTN. Of note; Pt. was being followed by [facility], and receiving Home health therapy services. Pt. changed physicians, and was referred for outpatient rehab services here.  PRECAUTIONS: None  WEIGHT BEARING RESTRICTIONS: No  PAIN:  Are you having pain? 8/10 Right neck, shoulder.  FALLS: Has patient fallen in last 6 months? No, 1 small slip near the bed tripped over shoes  LIVING ENVIRONMENT: Lives with: lives alone Lives in: [place],  Stairs: Entry level Has following equipment at home: Quad cane small base, rollator, shower chair, grab bars, hand rail at [employer]  PLOF:  Independent  PATIENT GOALS: Improve self-care  OBJECTIVE:  Note: Objective measures were completed at Evaluation unless otherwise noted.  HAND DOMINANCE: Right  ADLs:  Assist at home:  Ex wife, and children assist several times a week  Transfers/ambulation related to ADLs: Modified Independent Eating: Difficulty using dominant right hand. Drops food when scooping, Both hands for stabilizing drinks, and uses a straw. Unable to cut food.  Grooming: Increased time required for all grooming tasks. UB Dressing: Difficulty managing buttons LB Dressing: Less assist with pants with elastic, more assist with dress pants Toileting: Increased time for hygiene. Bathing: Requires increased time to complete Tub Shower transfers: Modified Independence with increased time  IADLs: Shopping: Son assists with grocery shopping Light housekeeping: Son's assist. Pt. Does light rising off of dishes. Meal Prep: Increased time. Independent with microwave, and air fryer. Difficulty opening bottles, containers Community mobility:  Transportation, light driving to grocery store Medication management:  Son's set-up weekly pillbox, Pt. Is responsible for taking the medications Financial management: Son's assist with monthly finances Handwriting: 50% legible Hobbies: Dancing, watching sports Career/work: Restaurant manager, fast food  MOBILITY STATUS: Uses a cane; shuffling gait  FUNCTIONAL OUTCOME MEASURES:  Mam-20 sum score: 53/80, MAM Measure score: 52.4   UPPER EXTREMITY ROM:    Active ROM Right eval Right 11/09/23 01/04/24 Left Eval [facility] overall  Shoulder flexion WFL [phone] 873(862)   Shoulder abduction 52(100) 60(110) 31(881)   Shoulder adduction  Shoulder extension      Shoulder internal rotation      Shoulder external rotation      Elbow flexion [facility] WFL   Elbow extension [facility] WFL   Wrist flexion      Wrist extension [facility] WFL   Wrist ulnar deviation      Wrist radial deviation       Wrist pronation      Wrist supination      (Blank rows = not tested)  UPPER EXTREMITY MMT:     MMT Right eval Right 11/09/23 Right 01/04/24 Left Eval 4-/5 overall Left 11/09/23 4/5 Overall  Shoulder flexion 3/5 3-/5 3-/5 3-/5   Shoulder abduction 2/5 2/5 2+/5 2+/5   Shoulder adduction       Shoulder extension       Shoulder internal rotation       Shoulder external rotation       Middle trapezius       Lower trapezius       Elbow flexion 3/5 3+/5 3+/5 3+/5   Elbow extension 3/5 3+5 4-/5 4-/5   Wrist flexion       Wrist extension 3/5 3/5 3/5 3/5   Wrist ulnar deviation       Wrist radial deviation       Wrist pronation       Wrist supination       (Blank rows = not tested)  HAND FUNCTION:   Eval: Grip strength: Right: 50 lbs; Left: 34 lbs, Lateral pinch: Right: 17 lbs, Left: 9 lbs, 3 point pinch: Right: 12 lbs, Left: 11 lbs  11/09/23: Grip strength: Right: 60 lbs; Left: 35 lbs, Lateral pinch: Right: 19 lbs, Left: 11 lbs, 3 point pinch: Right: 13 lbs, Left: 9 lbs  01/04/24:Grip strength: Right: 40 lbs; Left: 41 lbs, Lateral pinch: Right: 17 lbs, Left: 8 lbs, 3 point pinch: Right: 9 lbs, Left: 10 lbs  COORDINATION:  Eval: 9 Hole Peg test: Right: 1 min. & 46 sec; Left: 53 sec  11/09/2023: 9 Hole Peg test: Right: 1 min. & 22 sec; Left: 46 sec  01/04/24: 9 Hole Peg test: Right: 1 min. & 11 sec; Left: 46 sec   SENSATION:  Light touch: WFL Proprioception: WFL  EDEMA: N/A  COGNITION: Overall cognitive status: Within functional limits for tasks assessed  VISION:  Does not wear glasses.  VISION ASSESSMENT: To be further assessed in functional context  PERCEPTION: WFL  PRAXIS: Impaired: Motor planning                                                                                                                         TREATMENT DATE: 03/16/24    Therapeutic Exercises: -EZ Board exercises performed to target B forearm supination/pronation, wrist  flexion/extension using gross grasp, and lateral pinch (key) grasp in multiple  planes to promote shoulder flexion, abduction, and wrist flexion, and extension while performing resistive wrist flexion and extension with a gross grip.  Neuromuscular Re-education:  -Facilitated grasp and release patterns picking up dice and poker chips from table. Passed dice L>R and R>L prior to placing in jar, requires intermittent support for R arm from L to reach container. Completed x1 poker chip translate from palm to fingertip with significantly increased time.   Therapeutic Activity:  -Focus on functional reaching patterns pushing container across table outside BOS. Increased tremor reaching with R hand, educated on sliding across table to decrease tremors.        PATIENT EDUCATION: Education details: UE ROM, ADL functional status Person educated: Patient Education method: Explanation, Demonstration, Tactile cues, and Verbal cues Education comprehension: needs further education  HOME EXERCISE PROGRAM:  Continue to assess, and provide as indicated.   GOALS: Goals reviewed with patient? Yes  SHORT TERM GOALS: Target date: 02/15/2024     Pt. Will be independent with HEPs for RUE. Baseline: 01/04/24: Independent 11/09/2023: Independent Eval: No current HEP Goal status:Ongoing  LONG TERM GOALS: Target date: 03/28/2024  1.  Pt. Will increase right shoulder abduction by 10 degrees to assist with UE dressing Baseline: 01/04/24: 31(881) Pt. reports having donning his shirt is easier with his new routine 11/09/23: Right 60/(110) Eval: Right 52(100) Goal status: Ongoing  2.  Pt. Will increase BUE strength by 2 mm grades to assist with ADLs. Baseline: 01/04/24:  Right: shoulder flexion: 3-/5, abduction: 2/5, elbow flexion 4-/5, extension: 3+/5, wrist extension: 3/5. Left: 4/5 overallEval: Right: shoulder flexion: 3/5, abduction: 2/5, elbow flexion/extension: 3/5, wrist extension: 3/5. Left: 4-/5  overall4/08/25: Right: shoulder flexion: 3-/5, abduction: 2/5, elbow flexion/extension: 3+/5, wrist extension: 3/5. Left: 4/5 overallEval: Right: shoulder flexion: 3/5, abduction: 2/5, elbow flexion/extension: 3/5, wrist extension: 3/5. Left: 4-/5 overall Goal status: Ongoing  3.  Pt. Will perform self-feeding with modified independence Baseline: 01/04/24: Pt. reports making improvements with self-feeding. Pt. has difficulty at times with bringing his spoon to his mouth, often bringing an empty spoon to his mouth 11/09/23: Pt. Continues to have difficulty using dominant right hand. Drops food when scooping, Both hands for stabilizing drinks, and uses a straw. Unable to cut food. Eval: Difficulty using dominant right hand. Drops food when scooping, Both hands for stabilizing drinks, and uses a straw. Unable to cut food. Goal status: Ongoing  4.  Pt. Will demonstrate independence with proper A/E techniques/compensatory strategies for ADL/IADLs.  Baseline: 01/04/24: Continue ongoing as needed. 11/09/23: Continue Eval: Education to be provided Goal status: Ongoing  5.  Pt. Will write a sentence with 75% legibility with modified independence Baseline: 01/04/24:  One sentence completed in 2 min. & 51 sec. With 50% legibility.11/09/2023: 50% legibility for one sentence. Eval: 50% legibility for one printed sentence Goal status: Ongoing   6.  Pt. Will improve bilateral [facility] skills by 3 sec. on the 9 Hole Peg Test to be able to manipulate small objects.  Baseline: 9 Hole Peg Test: R: 1 min. & 11 sec.  L: 46 sec. 11/09/2023: 9 Hole Peg test: Right: 1 min. & 22 sec; Left: 46 sec Eval: 9 Hole Peg test: Right: 1 min. & 46 sec; Left: 53 sec Goal status:New  7. Pt. Will improve the Mam-20 score by 5 points to reflect ADL/IADL improvement. Baseline: Mam-20 sum score: 53/80, Mam Measure score: 52.4 Goal status New   ASSESSMENT:  CLINICAL IMPRESSION:  Facilitated bimanual dexterity picking up dice and poker chips  with L and R hand, passing between hands, and placing in container. Significant difficulty attempting to translate poker chip from palm to  fingertip, achieved with chip placed at distal palmar crease. Utilized EZ board for functional strengthening targeting forearm supinator/pronators and wrist extensors/flexors. Continues to report pain limiting activity. Pt has a flat affect but smiled and laughed during session while talking about his family.   Pt. continues to benefit from OT services to work on improving overall bilateral UE functioning in order to maximize engagement in, and efficiency in ADLs, and IADL tasks, and provide education about compensatory strategies.    PERFORMANCE DEFICITS: in functional skills including ADLs, IADLs, coordination, dexterity, proprioception, sensation, ROM, strength, pain, Fine motor control, Gross motor control, and UE functional use, cognitive skills including , and psychosocial skills including coping strategies, environmental adaptation, and routines and behaviors.   IMPAIRMENTS: are limiting patient from ADLs, IADLs, and leisure.   CO-MORBIDITIES: may have co-morbidities  that affects occupational performance. Patient will benefit from skilled OT to address above impairments and improve overall function.  MODIFICATION OR ASSISTANCE TO COMPLETE EVALUATION: Min-Moderate modification of tasks or assist with assess necessary to complete an evaluation.  OT OCCUPATIONAL PROFILE AND HISTORY: Detailed assessment: Review of records and additional review of physical, cognitive, psychosocial history related to current functional performance.  CLINICAL DECISION MAKING: Moderate - several treatment options, min-mod task modification necessary  REHAB POTENTIAL: Good  EVALUATION COMPLEXITY: Moderate    PLAN:  OT FREQUENCY: 2x/week  OT DURATION: 12 weeks  PLANNED INTERVENTIONS: 97168 OT Re-evaluation, 97535 self care/ADL training, 02889 therapeutic exercise, 97530  therapeutic activity, 97112 neuromuscular re-education, 97140 manual therapy, 97018 paraffin, 02989 moist heat, 97034 contrast bath, 97760 Orthotics management and training, 02239 Splinting (initial encounter), energy conservation, patient/family education, and DME and/or AE instructions  RECOMMENDED OTHER SERVICES:   PT  CONSULTED AND AGREED WITH PLAN OF CARE: Patient  PLAN FOR NEXT SESSION:   Treatment  Elston Slot, M.S. OTR/L  03/16/24, 2:02 PM  ascom [phone]   03/16/2024, 2:02 PM
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
## 2024-03-21 ENCOUNTER — Ambulatory Visit: Admitting: Occupational Therapy

## 2024-03-21 ENCOUNTER — Ambulatory Visit

## 2024-03-21 DIAGNOSIS — M6281 Muscle weakness (generalized): Secondary | ICD-10-CM

## 2024-03-21 DIAGNOSIS — R278 Other lack of coordination: Secondary | ICD-10-CM

## 2024-03-21 DIAGNOSIS — R2681 Unsteadiness on feet: Secondary | ICD-10-CM

## 2024-03-21 DIAGNOSIS — R262 Difficulty in walking, not elsewhere classified: Secondary | ICD-10-CM

## 2024-03-21 NOTE — Therapy (Signed)
 Occupational Therapy Progress Note  Dates of reporting period  01/04/24   to   03/21/24    Patient Name: Daniel Daugherty MRN: [id number] DOB:11-Dec-1965, 58 y.o., male Today's Date: 03/21/2024  PCP: N/A REFERRING PROVIDER: Odell Tor Edra CINDERELLA, MD  END OF SESSION:  OT End of Session - 03/21/24 2202     Visit Number 30    Number of Visits 48    Date for OT Re-Evaluation 03/28/24    OT Start Time 1145    OT Stop Time 1230    OT Time Calculation (min) 45 min    Activity Tolerance Patient tolerated treatment well    Behavior During Therapy [facility] for tasks assessed/performed               Past Medical History:  Diagnosis Date   Hypertension    Past Surgical History:  Procedure Laterality Date   DG HAND LEFT COMPLETE ([facility] HX) Left 04/13/1999   There are no active problems to display for this patient.   ONSET DATE: 05/2021  REFERRING DIAG: Parkinson's Disease  THERAPY DIAG:  Muscle weakness (generalized)  Other lack of coordination  Rationale for Evaluation and Treatment: Rehabilitation  SUBJECTIVE:   SUBJECTIVE STATEMENT:  Pt reports having had a great time on his cruise.  Pt accompanied by: self  PERTINENT HISTORY:   Pt. is a 58 y.o. male who was diagnosed with Parkinson's Disease in 2022. Pt. reports having had a progressive decline in function since having hernia surgery, and right shoulder rotator cuff surgery. PMHx included: HTN. Of note; Pt. was being followed by Daniel Daugherty, and receiving Home health therapy services. Pt. changed physicians, and was referred for outpatient rehab services here.  PRECAUTIONS: None  WEIGHT BEARING RESTRICTIONS: No  PAIN:  Are you having pain? 0/10 pain  FALLS: Has patient fallen in last 6 months? No, 1 small slip near the bed tripped over shoes  LIVING ENVIRONMENT: Lives with: lives alone Lives in: Daniel Daugherty,  Stairs: Entry level Has following equipment at home: Quad cane small base, rollator, shower chair, grab bars,  hand rail at [employer]  PLOF: Independent  PATIENT GOALS: Improve self-care  OBJECTIVE:  Note: Objective measures were completed at Evaluation unless otherwise noted.  HAND DOMINANCE: Right  ADLs:  Assist at home:  Ex wife, and children assist several times a week  Transfers/ambulation related to ADLs: Modified Independent Eating: Difficulty using dominant right hand. Drops food when scooping, Both hands for stabilizing drinks, and uses a straw. Unable to cut food.  Grooming: Increased time required for all grooming tasks. UB Dressing: Difficulty managing buttons LB Dressing: Less assist with pants with elastic, more assist with dress pants Toileting: Increased time for hygiene. Bathing: Requires increased time to complete Tub Shower transfers: Modified Independence with increased time  IADLs: Shopping: Son assists with grocery shopping Light housekeeping: Son's assist. Pt. Does light rising off of dishes. Meal Prep: Increased time. Independent with microwave, and air fryer. Difficulty opening bottles, containers Community mobility:  Transportation, light driving to grocery store Medication management:  Son's set-up weekly pillbox, Pt. Is responsible for taking the medications Financial management: Son's assist with monthly finances Handwriting: 50% legible Hobbies: Dancing, watching sports Career/work: Restaurant manager, fast food  MOBILITY STATUS: Uses a cane; shuffling gait  FUNCTIONAL OUTCOME MEASURES:  Mam-20 sum score: 53/80, MAM Measure score: 52.4   UPPER EXTREMITY ROM:    Active ROM Right eval Right 11/09/23 01/04/24 Left Eval [facility] overall  Shoulder flexion WFL [phone]   Shoulder  abduction 52(100) 60(110) [phone])   Shoulder adduction      Shoulder extension      Shoulder internal rotation      Shoulder external rotation      Elbow flexion [facility] WFL   Elbow extension [facility] WFL   Wrist flexion      Wrist extension [facility] WFL   Wrist ulnar deviation       Wrist radial deviation      Wrist pronation      Wrist supination      (Blank rows = not tested)  UPPER EXTREMITY MMT:     MMT Right eval Right 11/09/23 Right 01/04/24 Left Eval 4-/5 overall Left 11/09/23 4/5 Overall  Shoulder flexion 3/5 3-/5 3-/5 3-/5   Shoulder abduction 2/5 2/5 2+/5 2+/5   Shoulder adduction       Shoulder extension       Shoulder internal rotation       Shoulder external rotation       Middle trapezius       Lower trapezius       Elbow flexion 3/5 3+/5 3+/5 3+/5   Elbow extension 3/5 3+5 4-/5 4-/5   Wrist flexion       Wrist extension 3/5 3/5 3/5 3/5   Wrist ulnar deviation       Wrist radial deviation       Wrist pronation       Wrist supination       (Blank rows = not tested)  HAND FUNCTION:   Eval: Grip strength: Right: 50 lbs; Left: 34 lbs, Lateral pinch: Right: 17 lbs, Left: 9 lbs, 3 point pinch: Right: 12 lbs, Left: 11 lbs  11/09/23: Grip strength: Right: 60 lbs; Left: 35 lbs, Lateral pinch: Right: 19 lbs, Left: 11 lbs, 3 point pinch: Right: 13 lbs, Left: 9 lbs  01/04/24:Grip strength: Right: 40 lbs; Left: 41 lbs, Lateral pinch: Right: 17 lbs, Left: 8 lbs, 3 point pinch: Right: 9 lbs, Left: 10 lbs  COORDINATION:  Eval: 9 Hole Peg test: Right: 1 min. & 46 sec; Left: 53 sec  11/09/2023: 9 Hole Peg test: Right: 1 min. & 22 sec; Left: 46 sec  01/04/24: 9 Hole Peg test: Right: 1 min. & 11 sec; Left: 46 sec   SENSATION:  Light touch: WFL Proprioception: WFL  EDEMA: N/A  COGNITION: Overall cognitive status: Within functional limits for tasks assessed  VISION:  Does not wear glasses.  VISION ASSESSMENT: To be further assessed in functional context  PERCEPTION: WFL  PRAXIS: Impaired: Motor planning                                                                                                                         TREATMENT DATE: 03/21/24   Therapeutic Exercises:  The patient completed 10 minutes at level on the SciFit using  bilateral upper extremity reciprocal movements (clockwise) to promote upper extremity strength, endurance, and ROM, as well as to improve cardiorespiratory  fitness. The therapist increased the resistance level and continuously monitored the patient's response to the intervention. The patient required min cueing for technique and CGA level of assistance.     Therapeutic Activity:   -facilitated right hand [facility] skills grasping, and flipping  [place]  discs setup in a set of 60 on a tabletop surface. -facilitated bimanual hand dexterity skills grasping the [place]  discs with the right hand, and moving and transferring them from the right hand to the left hand, and back to the left hand in combination with reaching up, and out to place them onto the tabletop surface beyond the [place]  disc board.  PATIENT EDUCATION: Education details: UE ROM, ADL functional status Person educated: Patient Education method: Explanation, Demonstration, Tactile cues, and Verbal cues Education comprehension: needs further education  HOME EXERCISE PROGRAM:  Continue to assess, and provide as indicated.   GOALS: Goals reviewed with patient? Yes  SHORT TERM GOALS: Target date: 02/15/2024     Pt. Will be independent with HEPs for RUE. Baseline: 01/04/24: Independent 11/09/2023: Independent Eval: No current HEP Goal status:Ongoing  LONG TERM GOALS: Target date: 03/28/2024  1.  Pt. Will increase right shoulder abduction by 10 degrees to assist with UE dressing Baseline: 03/21/24: Pt. Requires increased time to donn his shirt. 01/04/24: 31(881) Pt. reports having donning his shirt is easier with his new routine 11/09/23: Right 60/(110) Eval: Right 52(100) Goal status: Ongoing  2.  Pt. Will increase BUE strength by 2 mm grades to assist with ADLs. Baseline: 03/21/24: Right: shoulder flexion: 3-/5, abduction: 3-/5, elbow flexion 4-/5, extension: 4-/5, wrist extension: 3/5. Left: 4+/5 overall Eval: Right: shoulder  flexion: 3/5, abduction: 2/5, elbow flexion/extension: 3/5, wrist extension: 3/5. Left: 4-/5 overall  01/04/24:  Right: shoulder flexion: 3-/5, abduction: 2/5, elbow flexion 4-/5, extension: 3+/5, wrist extension: 3/5. Left: 4/5 overallEval: Right: shoulder flexion: 3/5, abduction: 2/5, elbow flexion/extension: 3/5, wrist extension: 3/5. Left: 4-/5 overall4/08/25: Right: shoulder flexion: 3-/5, abduction: 2/5, elbow flexion/extension: 3+/5, wrist extension: 3/5. Left: 4/5 overallEval: Right: shoulder flexion: 3/5, abduction: 2/5, elbow flexion/extension: 3/5, wrist extension: 3/5. Left: 4-/5 overall Goal status: Ongoing  3.  Pt. Will perform self-feeding with modified independence Baseline: 03/21/24: Pt. Requires is improving with hand to mouth patterns,however continues to have difficulty scooping his food, and consistency with hand to mouth patterns. 01/04/24: Pt. reports making improvements with self-feeding. Pt. has difficulty at times with bringing his spoon to his mouth, often bringing an empty spoon to his mouth 11/09/23: Pt. Continues to have difficulty using dominant right hand. Drops food when scooping, Both hands for stabilizing drinks, and uses a straw. Unable to cut food. Eval: Difficulty using dominant right hand. Drops food when scooping, Both hands for stabilizing drinks, and uses a straw. Unable to cut food. Goal status: Ongoing  4.  Pt. Will demonstrate independence with proper A/E techniques/compensatory strategies for ADL/IADLs.  Baseline: 03/21/24: Continue 01/04/24: Continue ongoing as needed. 11/09/23: Continue Eval: Education to be provided Goal status: Ongoing  5.  Pt. Will write a sentence with 75% legibility with modified independence Baseline: 03/21/24: Continue 01/04/24:  One sentence completed in 2 min. & 51 sec. With 50% legibility.11/09/2023: 50% legibility for one sentence. Eval: 50% legibility for one printed sentence Goal status: Ongoing   6.  Pt. Will improve bilateral [facility]  skills by 3 sec. on the 9 Hole Peg Test to be able to manipulate small objects.  Baseline: 03/21/24: Pt. Has difficulty efficiently manipulating small objects. Pt. 9 Hole Peg Test: R: 1  min. & 11 sec.  L: 46 sec. 11/09/2023: 9 Hole Peg test: Right: 1 min. & 22 sec; Left: 46 sec Eval: 9 Hole Peg test: Right: 1 min. & 46 sec; Left: 53 sec Goal status: Ongoing  7. Pt. Will improve the Mam-20 score by 5 points to reflect ADL/IADL improvement. Baseline: Mam-20 sum score: 53/80, Mam Measure score: 52.4 Goal status  Ongoing   ASSESSMENT:  CLINICAL IMPRESSION:  Pt. has had multiple cancellations over this progress reporting period 2/2 having been out of town, and having had transportation issues. Pt. is improving with tolerance for ROM, and functional reaching with decreased pain in the right shoulder, and neck. Pt. continues to present with limited right shoulder ROM requiring assist proximally when reaching out to place items on a surface in front of him. Pt. continues to present with delayed task initiation, requiring increased time. Pt. Continues to require assist with performing self-feeding scooping food, and performing the hand-to-mouth pattern efficiently. Pt. continues to benefit from OT services to work on improving overall bilateral UE functioning in order to maximize engagement in, and efficiency in ADLs, and IADL tasks, and provide education about compensatory strategies.    PERFORMANCE DEFICITS: in functional skills including ADLs, IADLs, coordination, dexterity, proprioception, sensation, ROM, strength, pain, Fine motor control, Gross motor control, and UE functional use, cognitive skills including , and psychosocial skills including coping strategies, environmental adaptation, and routines and behaviors.   IMPAIRMENTS: are limiting patient from ADLs, IADLs, and leisure.   CO-MORBIDITIES: may have co-morbidities  that affects occupational performance. Patient will benefit from skilled OT to  address above impairments and improve overall function.  MODIFICATION OR ASSISTANCE TO COMPLETE EVALUATION: Min-Moderate modification of tasks or assist with assess necessary to complete an evaluation.  OT OCCUPATIONAL PROFILE AND HISTORY: Detailed assessment: Review of records and additional review of physical, cognitive, psychosocial history related to current functional performance.  CLINICAL DECISION MAKING: Moderate - several treatment options, min-mod task modification necessary  REHAB POTENTIAL: Good  EVALUATION COMPLEXITY: Moderate    PLAN:  OT FREQUENCY: 2x/week  OT DURATION: 12 weeks  PLANNED INTERVENTIONS: 97168 OT Re-evaluation, 97535 self care/ADL training, 02889 therapeutic exercise, 97530 therapeutic activity, 97112 neuromuscular re-education, 97140 manual therapy, 97018 paraffin, 02989 moist heat, 97034 contrast bath, 97760 Orthotics management and training, 02239 Splinting (initial encounter), energy conservation, patient/family education, and DME and/or AE instructions  RECOMMENDED OTHER SERVICES:   PT  CONSULTED AND AGREED WITH PLAN OF CARE: Patient  PLAN FOR NEXT SESSION:   Treatment  Cyra Spader, MS, OTR/L   03/21/2024, 10:05 PM

## 2024-03-23 ENCOUNTER — Ambulatory Visit

## 2024-03-23 ENCOUNTER — Ambulatory Visit: Admitting: Occupational Therapy

## 2024-03-23 DIAGNOSIS — R531 Weakness: Secondary | ICD-10-CM

## 2024-03-23 DIAGNOSIS — R2681 Unsteadiness on feet: Secondary | ICD-10-CM

## 2024-03-23 DIAGNOSIS — M6281 Muscle weakness (generalized): Secondary | ICD-10-CM

## 2024-03-23 DIAGNOSIS — R269 Unspecified abnormalities of gait and mobility: Secondary | ICD-10-CM

## 2024-03-23 DIAGNOSIS — R278 Other lack of coordination: Secondary | ICD-10-CM

## 2024-03-23 DIAGNOSIS — R262 Difficulty in walking, not elsewhere classified: Secondary | ICD-10-CM

## 2024-03-23 NOTE — Therapy (Signed)
 OUTPATIENT PHYSICAL THERAPY TREATMENT   Patient Name: MIGUEL CHRISTIANA MRN: [id number] DOB:09-23-1965, 58 y.o., male Today's Date: 03/23/2024  PCP: Odell Chard, Edra GRADE, MD  REFERRING PROVIDER: Odell Chard, Edra GRADE, MD   END OF SESSION:    PT End of Session - 03/23/24 1404     Visit Number 31    Number of Visits 43    Date for PT Re-Evaluation 03/28/24    Authorization Type Medicare 2025    Progress Note Due on Visit 30    PT Start Time 1403    PT Stop Time 1445    PT Time Calculation (min) 42 min    Equipment Utilized During Treatment Gait belt    Activity Tolerance Patient tolerated treatment well    Behavior During Therapy WFL for tasks assessed/performed            Past Medical History:  Diagnosis Date   Hypertension    Past Surgical History:  Procedure Laterality Date   DG HAND LEFT COMPLETE ([facility] HX) Left 04/13/1999   There are no active problems to display for this patient.  ONSET DATE: 2022 is when he noticed the change and that was around the same time he was diagnosed with Parkinson's  REFERRING DIAG:  G20.A1 (ICD-10-CM) - Parkinson disease (HCC)  Z74.1 (ICD-10-CM) - Requires daily assistance for activities of daily living (ADL) and comfort needs   THERAPY DIAG:  Muscle weakness (generalized)  Other lack of coordination  Difficulty in walking, not elsewhere classified  Generalized weakness  Unsteadiness on feet  Abnormality of gait  Rationale for Evaluation and Treatment: Rehabilitation  SUBJECTIVE:                                                                                                                                                                                             SUBJECTIVE STATEMENT:   Pt reports doing well. No falls.   PERTINENT HISTORY: Parkinsonism, Vitamin D deficiency, Hyperlipidemia, HTN, poor medication compliance (per chart review)  PAIN:  Are you having pain? Rt shoulder/neck 4-5/10.   PRECAUTIONS:  Fall  RED FLAGS: None   WEIGHT BEARING RESTRICTIONS: No  FALLS: Has patient fallen in last 6 months? Yes. 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   PATIENT GOALS: Get my balance right with my walking, help get my R arm stronger and be able to move it more, improve strength in my legs (specifically the Right)  OBJECTIVE:  Note: Objective measures were completed at Evaluation unless otherwise noted.                                                                                                                             TREATMENT DATE: 03/23/24   There.Act:   Octane x 7 min for neurological priming and activation of reciprocal movement patterns random resistance with level 3 base.    Sit<>stand with UE swing into extension. 2 x 10, x10 elevating 2 KG med ball in UE's.   Gait 150' with 2.5# AW's, CGA. VC's for foot clearance.   Alternating foot taps with different color cones with BUE support. Called out different patterns of colors for pt to provide cognitive task.   Side stepping in // bars no UE support with 2.5# AW's on. 3 laps down and back with VC's for foot clearance and step lengths. SBA.      PATIENT EDUCATION: Education details: exercise technique, reassessment  Person educated: Patient Education method: Explanation, demo, vc Education comprehension: verbalized understanding and needs  further education  HOME EXERCISE PROGRAM:  Access Code: [email or web address] URL: [email or web address] Date: 12/02/2023 Prepared by: Connell Kiss  Exercises - Sidelying Thoracic Rotation with Open Book  - 1 x daily - 7 x weekly - 2 sets - 10 reps - Supine Shoulder External Rotation in Abduction  - 1 x daily - 7 x weekly - 2 sets - 10 reps - 10 seconds hold - Seated Scapular Retraction  - 1 x daily - 7 x weekly - 3 sets - 10 reps - 3 seconds hold - Sit to Stand with Arm Swing  - 1 x daily - 7 x weekly - 2 sets - 10 reps   GOALS: Goals reviewed with patient? Yes  SHORT TERM GOALS: Target date: 02/15/2024    Patient will be independent in home exercise program to improve strength/mobility for better functional independence with ADLs. Baseline: need to initiate 10/07/2023: provided 12/02/2023: updated Goal status: IN PROGRESS   LONG TERM GOALS: Target date: 03/28/2024   1.  Patient (< 53 years old) will complete five times sit to stand test in < 12 seconds indicating an increased LE strength and improved balance. Baseline: 09/28/23: 38.53 seconds 11/09/2023: 22.13 seconds with arms across chest 01/04/2024: 20.12 seconds without UE support 03/21/24: 17.20 seconds without UE support  Goal status: IN PROGRESS   2. Patient will reduce timed up and go to <11 seconds to reduce fall risk and demonstrate improved transfer/gait ability. Baseline: 09/28/23: 19.46 seconds without AD 11/09/2023: 16.96 seconds, no AD, with close SBA for balance safety 01/04/2024: 15.845  seconds, no AD with close SBA for safety 03/21/24: 14.83 seconds, no AD with close SBA for safety  Goal status: IN PROGRESS  3.  Patient will increase 10 meter walk test to >1.79m/s as to improve gait speed for better community ambulation and to reduce fall risk. Baseline: 09/28/23: 0.66m/s without AD 11/09/2023: 0.89 m/s (11.50 seconds seconds 10.94 seconds) no AD, with close SBA for safety 01/04/2024: 0.935 m/s without AD, CGA/close SBA  for safety 03/21/24: 0.8 m/s without AD, CGA/close SBA for safety Goal status: IN PROGRESS  4.  Patient will increase six minute walk test distance to >1032ft for progression to community ambulator and improve gait ability Baseline: 10/04/2023: 515ft (177m at 0.81m/s avg) 11/09/2023: 1061 feet (323 meters, Avg speed 0.897 m/s) without AD Goal status: MET and Advanced, ONGOING with updated goal 11/09/2023 Goal Updated: distance > 1325ft 01/13/2024: 832  ft mostly without SPC but then pt needed SPC due to fatigue, required one break (standing) 03/21/24: 750' with mostly SPC due to fatigue    5. Patient will increase MiniBest Test score to >18/28 to indicate a reduced risk for falling and demonstrate increased independence with functional mobility and ADLs.  Baseline: 10/04/2023: 8/28  11/16/2023: 16/28  01/22/2024: deferred 6/17: 20/28 03/21/24: 17/28  Goal status: Re-instated due to score below goal   6. Patient will increase ABC scale score >80% to demonstrate better functional mobility and better confidence with ADLs.   Baseline: 09/28/23: 17.5% 11/09/2023: 21.25% - pt reports he rated the test as is if he was not using his walker because this is his long term goal  01/04/2024: 35.625% - continuing to rate it as if he is not using his walker 03/21/24: 23% - rates as if he is not using SPC    Goal status: IN PROGRESS   ASSESSMENT:  CLINICAL IMPRESSION:  Continuing PT POC working on gait and LE strengthening. Pt continues with difficulty with foot clearance with intermittent gait variability with SPC  sequencing but does not have LOB. Pt does continue to have mildly forward flexed posture and as mentioned before would benefit from postural strengthening. Pt remains motivated throughout session completing all activities without issue. Pt will continue to benefit from skilled physical therapy intervention to address impairments, improve QOL, and attain therapy goals.     OBJECTIVE IMPAIRMENTS: Abnormal  gait, decreased activity tolerance, decreased balance, decreased coordination, decreased endurance, decreased knowledge of condition, decreased knowledge of use of DME, decreased mobility, difficulty walking, decreased ROM, decreased strength, decreased safety awareness, improper body mechanics, postural dysfunction, and pain.   ACTIVITY LIMITATIONS: carrying, lifting, bending, standing, squatting, sleeping, stairs, transfers, bed mobility, continence, bathing, toileting, dressing, reach over head, hygiene/grooming, and locomotion level  PARTICIPATION LIMITATIONS: meal prep, cleaning, laundry, and community activity  PERSONAL FACTORS: Fitness and 3+ comorbidities: Vitamin D deficiency, Hyperlipidemia, and HTN are also affecting patient's functional outcome.   REHAB POTENTIAL: Good  CLINICAL DECISION MAKING: Evolving/moderate complexity  EVALUATION COMPLEXITY: Moderate  PLAN:  PT FREQUENCY: 1-2x/week  PT DURATION: 12 weeks  PLANNED INTERVENTIONS: 97164- PT Re-evaluation, 97110-Therapeutic exercises, 97530- Therapeutic activity, [id number]- Neuromuscular re-education, 97535- Self Care, 02859- Manual therapy, [id number]- Gait training, [phone]- Orthotic Fit/training, [phone]- Canalith repositioning, [phone]- Electrical stimulation (manual), Patient/Family education, Balance training, Stair training, Joint mobilization, Vestibular training, Visual/preceptual remediation/compensation, DME instructions, Cryotherapy, and Moist heat  PLAN FOR NEXT SESSION:   Postural strengthening and positioning.  Large amplitude movement. Safety with gait and at home AD.   Dorina HERO. Fairly IV, PT, DPT Physical  Therapist- [place]  [facility] 3:45 PM 03/23/24

## 2024-03-23 NOTE — Therapy (Addendum)
 Occupational Therapy Recertification Note   Patient Name: Daniel Daugherty MRN: [id number] DOB:[birth year], 58 y.o., male Today's Date: 03/23/2024  PCP: N/A REFERRING PROVIDER: Odell Tor Edra CINDERELLA, MD  END OF SESSION:  OT End of Session - 03/23/24 1320     Visit Number 31    Number of Visits 48    Date for OT Re-Evaluation 06/15/24    OT Start Time 1300    OT Stop Time 1345    OT Time Calculation (min) 45 min    Activity Tolerance Patient tolerated treatment well    Behavior During Therapy WFL for tasks assessed/performed               Past Medical History:  Diagnosis Date   Hypertension    Past Surgical History:  Procedure Laterality Date   DG HAND LEFT COMPLETE ([facility] HX) Left 04/13/1999   There are no active problems to display for this patient.   ONSET DATE: 05/2021  REFERRING DIAG: Parkinson's Disease  THERAPY DIAG:  Muscle weakness (generalized)  Rationale for Evaluation and Treatment: Rehabilitation  SUBJECTIVE:   SUBJECTIVE STATEMENT:  Pt reports that his son is going to help him order adaptive utensils through [employer] on Friday. Pt accompanied by: self  PERTINENT HISTORY:   Pt. is a 58 y.o. male who was diagnosed with Parkinson's Disease in 2022. Pt. reports having had a progressive decline in function since having hernia surgery, and right shoulder rotator cuff surgery. PMHx included: HTN. Of note; Pt. was being followed by [facility], and receiving Home health therapy services. Pt. changed physicians, and was referred for outpatient rehab services here.  PRECAUTIONS: None  WEIGHT BEARING RESTRICTIONS: No  PAIN:  Are you having pain? 0/10 pain  FALLS: Has patient fallen in last 6 months? No, 1 small slip near the bed tripped over shoes  LIVING ENVIRONMENT: Lives with: lives alone Lives in: [place],  Stairs: Entry level Has following equipment at home: Quad cane small base, rollator, shower chair, grab bars, hand rail at [employer]  PLOF:  Independent  PATIENT GOALS: Improve self-care  OBJECTIVE:  Note: Objective measures were completed at Evaluation unless otherwise noted.  HAND DOMINANCE: Right  ADLs:  Assist at home:  Ex wife, and children assist several times a week  Transfers/ambulation related to ADLs: Modified Independent Eating: Difficulty using dominant right hand. Drops food when scooping, Both hands for stabilizing drinks, and uses a straw. Unable to cut food.  Grooming: Increased time required for all grooming tasks. UB Dressing: Difficulty managing buttons LB Dressing: Less assist with pants with elastic, more assist with dress pants Toileting: Increased time for hygiene. Bathing: Requires increased time to complete Tub Shower transfers: Modified Independence with increased time  IADLs: Shopping: Son assists with grocery shopping Light housekeeping: Son's assist. Pt. Does light rising off of dishes. Meal Prep: Increased time. Independent with microwave, and air fryer. Difficulty opening bottles, containers Community mobility:  Transportation, light driving to grocery store Medication management:  Son's set-up weekly pillbox, Pt. Is responsible for taking the medications Financial management: Son's assist with monthly finances Handwriting: 50% legible Hobbies: Dancing, watching sports Career/work: Restaurant manager, fast food  MOBILITY STATUS: Uses a cane; shuffling gait  FUNCTIONAL OUTCOME MEASURES:  Mam-20 sum score: 53/80, MAM Measure score: 52.4   UPPER EXTREMITY ROM:    Active ROM Right eval Right 11/09/23 Right  01/04/24 Right 03/23/24 Left Eval [facility] overall  Shoulder flexion WFL 120(134) [phone](862)   Shoulder abduction 52(100) 60(110) 31(881) 72(110)   Shoulder  adduction       Shoulder extension       Shoulder internal rotation       Shoulder external rotation       Elbow flexion [facility] WFL   Elbow extension [facility] WFL   Wrist flexion       Wrist extension [facility]  WFL   Wrist ulnar deviation       Wrist radial deviation       Wrist pronation       Wrist supination       (Blank rows = not tested)  UPPER EXTREMITY MMT:     MMT Right eval Right 11/09/23 Right 01/04/24 Right 03/23/24 Left Eval 4-/5 overall Left 11/09/23 4/5 Overall  Shoulder flexion 3/5 3-/5 3-/5 3-/5    Shoulder abduction 2/5 2/5 2+/5 3-/5    Shoulder adduction        Shoulder extension        Shoulder internal rotation        Shoulder external rotation        Middle trapezius        Lower trapezius        Elbow flexion 3/5 3+/5 3+/5 4-/5    Elbow extension 3/5 3+5 4-/5 4-/5    Wrist flexion        Wrist extension 3/5 3/5 3/5 3+/5    Wrist ulnar deviation        Wrist radial deviation        Wrist pronation        Wrist supination        (Blank rows = not tested)  HAND FUNCTION:   Eval: Grip strength: Right: 50 lbs; Left: 34 lbs, Lateral pinch: Right: 17 lbs, Left: 9 lbs, 3 point pinch: Right: 12 lbs, Left: 11 lbs  11/09/23: Grip strength: Right: 60 lbs; Left: 35 lbs, Lateral pinch: Right: 19 lbs, Left: 11 lbs, 3 point pinch: Right: 13 lbs, Left: 9 lbs  01/04/24:Grip strength: Right: 40 lbs; Left: 41 lbs, Lateral pinch: Right: 17 lbs, Left: 8 lbs, 3 point pinch: Right: 9 lbs, Left: 10 lbs  03/23/24:Grip strength: Right: 74 lbs; Left: 55 lbs, Lateral pinch: Right: 15 lbs, Left: 10 lbs, 3 point pinch: Right: 9 lbs, Left: 11 lbs   COORDINATION:  Eval: 9 Hole Peg test: Right: 1 min. & 46 sec; Left: 53 sec  11/09/2023: 9 Hole Peg test: Right: 1 min. & 22 sec; Left: 46 sec  01/04/24: 9 Hole Peg test: Right: 1 min. & 11 sec; Left: 46 sec  03/23/24: 9 Hole Peg test: Right: 1 min.; Left: 46 sec   SENSATION:  Light touch: WFL Proprioception: WFL  EDEMA: N/A  COGNITION: Overall cognitive status: Within functional limits for tasks assessed  VISION:  Does not wear glasses.  VISION ASSESSMENT: To be further assessed in functional context  PERCEPTION:  WFL  PRAXIS: Impaired: Motor planning  TREATMENT DATE: 03/23/24   Measurements were obtained, and goals were reviewed with the Pt.  PATIENT EDUCATION: Education details: Measurements, Goals, POC, UE ROM, ADL functional status Person educated: Patient Education method: Explanation, Demonstration, Tactile cues, and Verbal cues Education comprehension: needs further education  HOME EXERCISE PROGRAM:  Continue to assess, and provide as indicated.   GOALS: Goals reviewed with patient? Yes  SHORT TERM GOALS: Target date: 05/04/2024   Pt. Will be independent with HEPs for RUE. Baseline: 03/23/24: Independent, ongoing 01/04/24: Independent 11/09/2023: Independent Eval: No current HEP Goal status:Ongoing  LONG TERM GOALS: Target date: 06/15/2024  1.  Pt. Will increase right shoulder abduction by 10 degrees to  be able to reach up for hair care/grooming. Baseline: 03/23/24: 72(110) Pt. is independent with donning his shirt. However, requires increased time. 03/21/24: Pt. requires increased time to donn his shirt. 01/04/24: 31(881) Pt. reports having donning his shirt is easier with his new routine 11/09/23: Right 60/(110) Eval: Right 52(100) Goal status:  Achieved, revised  (03/23/24) to be able to perform hair care.  2.  Pt. Will increase BUE strength by 2 mm grades to assist with ADLs. Baseline:03/23/24: Right: shoulder flexion: 3-/5, abduction: 3-/5, elbow flexion 4-/5, extension: 4-/5, wrist extension: 3+/5. Eval: Right: shoulder flexion: 3/5, abduction: 2/5, elbow flexion/extension: 3/5, wrist extension: 3/5. Left: 4-/5 overall  03/21/24: Right: shoulder flexion: 3-/5, abduction: 3-/5, elbow flexion 4-/5, extension: 4-/5, wrist extension: 3/5. Left: 4+/5 overall Eval: Right: shoulder flexion: 3/5, abduction: 2/5, elbow flexion/extension: 3/5, wrist extension: 3/5. Left: 4-/5  overall  01/04/24:  Right: shoulder flexion: 3-/5, abduction: 2/5, elbow flexion 4-/5, extension: 3+/5, wrist extension: 3/5. Left: 4/5 overallEval: Right: shoulder flexion: 3/5, abduction: 2/5, elbow flexion/extension: 3/5, wrist extension: 3/5. Left: 4-/5 overall4/08/25: Right: shoulder flexion: 3-/5, abduction: 2/5, elbow flexion/extension: 3+/5, wrist extension: 3/5. Left: 4/5 overallEval: Right: shoulder flexion: 3/5, abduction: 2/5, elbow flexion/extension: 3/5, wrist extension: 3/5. Left: 4-/5 overall Goal status: Ongoing  3.  Pt. Will perform self-feeding with modified independence Baseline: 03/23/2024: Pt. Requires set-up, and assist to perform scooping action, and to perform hand to mouth patterns. Pt. could benefit from an adaptive utensil.03/21/24: Pt. Requires is improving with hand to mouth patterns,however continues to have difficulty scooping his food, and consistency with hand to mouth patterns. 01/04/24: Pt. reports making improvements with self-feeding. Pt. has difficulty at times with bringing his spoon to his mouth, often bringing an empty spoon to his mouth 11/09/23: Pt. Continues to have difficulty using dominant right hand. Drops food when scooping, Both hands for stabilizing drinks, and uses a straw. Unable to cut food. Eval: Difficulty using dominant right hand. Drops food when scooping, Both hands for stabilizing drinks, and uses a straw. Unable to cut food. Goal status: Ongoing  4.  Pt. Will demonstrate independence with proper A/E techniques/compensatory strategies for ADL/IADLs.  Baseline: 03/23/2024:  Continue 03/21/24: Continue 01/04/24: Continue ongoing as needed. 11/09/23: Continue Eval: Education to be provided Goal status: Ongoing  5.  Pt. Will write a sentence with 75% legibility with modified independence Baseline: 03/23/24: 1 min. & 52 sec. to complete one sentence with 60% legibility. Pt. Presents with micrographia towards the end of the sentence. 03/21/24: Continue 01/04/24:   One sentence completed in 2 min. & 51 sec. With 50% legibility.11/09/2023: 50% legibility for one sentence. Eval: 50% legibility for one printed sentence Goal status: Ongoing   6.  Pt. Will improve bilateral [facility] skills by 3 sec. on the 9 Hole Peg Test to be able to  manipulate small objects.  Baseline: 03/23/24: 9 Hole Peg test: Right: 1 min.; Left: 46 sec. 03/21/24: Pt. Has difficulty efficiently manipulating small objects. Pt. 9 Hole Peg Test: R: 1 min. & 11 sec.  L: 46 sec. 11/09/2023: 9 Hole Peg test: Right: 1 min. & 22 sec; Left: 46 sec Eval: 9 Hole Peg test: Right: 1 min. & 46 sec; Left: 53 sec Goal status: Ongoing  7. Pt. Will improve the Mam-20 score by 5 points to reflect ADL/IADL improvement. Baseline:03/23/2024: TBD; Eval: Mam-20 sum score: 53/80, Mam Measure score: 52.4 Goal status  Ongoing  8. Pt. will improve hand function skills to be able independently, and efficiently pick up, and place items (tv remotes/utensils/pens) into the proper position without using assistance from the left hand.  Baseline: 03/23/2024: Pt. Requires  assist from the left hand to position items within his right hand in preparation for using the item.    Goal status: New   ASSESSMENT:  CLINICAL IMPRESSION:              Pt. Has made steady progress over this recertification period. Pt. has progressed with Right shoulder, elbow, and wrist ROM. Pt. has improved with right UE strength, grip strength, pinch strength, and [facility] skills. Pt. Has achieved the goal for donning a shirt  independently with increased time required. The goal was revised to improve right shoulder abduction to be able to assist with hair care/grooming. Pt. Has improved with writing speed by 1 min.  Pt. Continues to work on improving RUE functioning to assist with self-grooming tasks, performing self-feeding tasks efficiently, and accurately, manipulating small objects, as well as writing legibility and speed.  Pt. Continues to benefit from reviewing  compensatory strategies for ADLs/IADL efficiency.  A new goal was added to the POC for the Pt. to be able to pick up, and position to use the items(pens/tv remote/ utensils) with the right hand only without using the left hand to assist. Pt. continues to benefit from OT services to work on improving overall bilateral UE functioning in order to maximize engagement in, and efficiency in ADLs, and IADL tasks, and provide education about compensatory strategies.    PERFORMANCE DEFICITS: in functional skills including ADLs, IADLs, coordination, dexterity, proprioception, sensation, ROM, strength, pain, Fine motor control, Gross motor control, and UE functional use, cognitive skills including , and psychosocial skills including coping strategies, environmental adaptation, and routines and behaviors.   IMPAIRMENTS: are limiting patient from ADLs, IADLs, and leisure.   CO-MORBIDITIES: may have co-morbidities  that affects occupational performance. Patient will benefit from skilled OT to address above impairments and improve overall function.  MODIFICATION OR ASSISTANCE TO COMPLETE EVALUATION: Min-Moderate modification of tasks or assist with assess necessary to complete an evaluation.  OT OCCUPATIONAL PROFILE AND HISTORY: Detailed assessment: Review of records and additional review of physical, cognitive, psychosocial history related to current functional performance.  CLINICAL DECISION MAKING: Moderate - several treatment options, min-mod task modification necessary  REHAB POTENTIAL: Good  EVALUATION COMPLEXITY: Moderate    PLAN:  OT FREQUENCY: 2x/week  OT DURATION: 12 weeks  PLANNED INTERVENTIONS: 97168 OT Re-evaluation, 97535 self care/ADL training, 02889 therapeutic exercise, 97530 therapeutic activity, 97112 neuromuscular re-education, 97140 manual therapy, 97018 paraffin, 02989 moist heat, 97034 contrast bath, 97760 Orthotics management and training, 02239 Splinting (initial encounter),  energy conservation, patient/family education, and DME and/or AE instructions  RECOMMENDED OTHER SERVICES:   PT  CONSULTED AND AGREED WITH PLAN OF CARE: Patient  PLAN FOR NEXT SESSION:  Treatment  Richardson Otter, MS, OTR/L   03/23/2024, 1:26 PM

## 2024-03-28 ENCOUNTER — Ambulatory Visit

## 2024-03-28 DIAGNOSIS — M6281 Muscle weakness (generalized): Secondary | ICD-10-CM

## 2024-03-28 DIAGNOSIS — R531 Weakness: Secondary | ICD-10-CM

## 2024-03-28 DIAGNOSIS — R278 Other lack of coordination: Secondary | ICD-10-CM

## 2024-03-28 DIAGNOSIS — R2681 Unsteadiness on feet: Secondary | ICD-10-CM

## 2024-03-28 DIAGNOSIS — R269 Unspecified abnormalities of gait and mobility: Secondary | ICD-10-CM

## 2024-03-28 DIAGNOSIS — R262 Difficulty in walking, not elsewhere classified: Secondary | ICD-10-CM

## 2024-03-28 NOTE — Therapy (Signed)
 OUTPATIENT PHYSICAL THERAPY TREATMENT/RE-CERT  Patient Name: Daniel Daugherty MRN: [id number] DOB:23-Jul-1966, 58 y.o., male Today's Date: 03/28/2024  PCP: Odell Chard, Edra GRADE, MD  REFERRING PROVIDER: Odell Chard, Edra GRADE, MD   END OF SESSION:    PT End of Session - 03/28/24 1404     Visit Number 32    Number of Visits 48    Date for PT Re-Evaluation 05/23/24    Authorization Type Medicare 2025    Progress Note Due on Visit 30    PT Start Time 1401    PT Stop Time 1445    PT Time Calculation (min) 44 min    Equipment Utilized During Treatment Gait belt    Activity Tolerance Patient tolerated treatment well    Behavior During Therapy WFL for tasks assessed/performed           Past Medical History:  Diagnosis Date   Hypertension    Past Surgical History:  Procedure Laterality Date   DG HAND LEFT COMPLETE ([facility] HX) Left 04/13/1999   There are no active problems to display for this patient.  ONSET DATE: 2022 is when he noticed the change and that was around the same time he was diagnosed with Parkinson's  REFERRING DIAG:  G20.A1 (ICD-10-CM) - Parkinson disease (HCC)  Z74.1 (ICD-10-CM) - Requires daily assistance for activities of daily living (ADL) and comfort needs   THERAPY DIAG:  Muscle weakness (generalized)  Other lack of coordination  Difficulty in walking, not elsewhere classified  Generalized weakness  Unsteadiness on feet  Abnormality of gait  Rationale for Evaluation and Treatment: Rehabilitation  SUBJECTIVE:                                                                                                                                                                                             SUBJECTIVE STATEMENT:    Pt reports he is doing well, just tired from the OT session as he was doing a lot of standing during activities.    PERTINENT HISTORY: Parkinsonism, Vitamin D deficiency, Hyperlipidemia, HTN, poor medication compliance (per  chart review)  PAIN:  Are you having pain? Rt shoulder/neck 4-5/10.   PRECAUTIONS: Fall  RED FLAGS: None   WEIGHT BEARING RESTRICTIONS: No  FALLS: Has patient fallen in last 6 months? Yes. 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   PATIENT GOALS: Get my balance right with my walking, help get my R arm stronger and be able to move it more, improve strength in my legs (specifically the Right)  OBJECTIVE:  Note: Objective measures were completed at Evaluation unless otherwise noted.                                                                                                                             TREATMENT DATE: 03/28/24   Self Care/Home Management  Therapist discussed current goals and the progress made thus far in therapy.  Pt and therapist agreeable to additional visits necessary to improve endurance levels and improve overall strength necessary for improved mobility.  Pt also assisted with cane height and was set for the pt's correct height.     TherEx:  Standing hip abduction with 3.75# AW donned, 2x10 each LE Standing hip extension with 3.75# AW donned, 2x10 each LE Standing hamstring curls with 3.75# AW donned, 2x10 each LE Standing marches with 3.75# AW donned, 2x10 each LE Standing heel raises with 3.75# AW donned, 2x10  Standing toe raises with 3.75# AW donned, 2x10    TherAct:    STS, x10 with verbal cues for upright posture in standing  Gait 150' with 3# AW's, CGA. VC's for foot clearance.   Standing green physioball rolls into forward flexion to promote extension of the lower back and UE strengthening, with education on postural stability     PATIENT EDUCATION: Education details: exercise technique, reassessment  Person educated: Patient Education method: Explanation, demo, vc Education comprehension: verbalized understanding and needs further education  HOME EXERCISE PROGRAM:  Access Code: [email or web address] URL: [email or web address] Date: 12/02/2023 Prepared by: Connell Kiss  Exercises - Sidelying Thoracic Rotation with Open Book  - 1 x daily - 7 x weekly - 2 sets - 10 reps - Supine Shoulder External Rotation in Abduction  - 1 x daily - 7 x weekly - 2 sets - 10 reps - 10 seconds hold - Seated Scapular Retraction  - 1 x daily - 7 x weekly - 3 sets - 10 reps - 3 seconds hold - Sit to Stand with Arm Swing  - 1 x daily - 7 x weekly - 2 sets - 10 reps   GOALS: Goals reviewed with patient? Yes  SHORT TERM GOALS: Target date: 02/15/2024    Patient will be independent in home exercise program to improve strength/mobility for better functional independence with ADLs. Baseline: need to initiate 10/07/2023: provided 12/02/2023: updated Goal status: IN PROGRESS   LONG TERM GOALS: Target date: 03/28/2024   1.  Patient (< 43 years old) will complete five times sit to stand test in < 12 seconds indicating an increased  LE strength and improved balance. Baseline: 09/28/23: 38.53 seconds 11/09/2023: 22.13 seconds with arms across chest 01/04/2024: 20.12 seconds without UE support 03/21/24: 17.20 seconds without UE support  Goal status: IN PROGRESS   2. Patient will reduce timed up and go to <11 seconds to reduce fall risk and demonstrate improved transfer/gait ability. Baseline: 09/28/23: 19.46 seconds without AD 11/09/2023: 16.96 seconds, no AD, with  close SBA for balance safety 01/04/2024: 15.845 seconds, no AD with close SBA for safety 03/21/24: 14.83 seconds, no AD with close SBA for safety  Goal status: IN PROGRESS  3.  Patient will increase 10 meter walk test to >1.77m/s as to improve gait speed for better community ambulation and to reduce fall risk. Baseline: 09/28/23: 0.21m/s without AD 11/09/2023: 0.89 m/s (11.50 seconds seconds 10.94 seconds) no AD, with close SBA for safety 01/04/2024: 0.935 m/s without AD, CGA/close SBA for safety 03/21/24: 0.8 m/s without AD, CGA/close SBA for safety Goal status: IN PROGRESS  4.  Patient will increase six minute walk test distance to >1054ft for progression to community ambulator and improve gait ability Baseline: 10/04/2023: 541ft (165m at 0.94m/s avg) 11/09/2023: 1061 feet (323 meters, Avg speed 0.897 m/s) without AD Goal status: MET and Advanced, ONGOING with updated goal 11/09/2023 Goal Updated: distance > 1369ft 01/13/2024: 832  ft mostly without SPC but then pt needed SPC due to fatigue, required one break (standing) 03/21/24: 750' with mostly SPC due to fatigue    5. Patient will increase MiniBest Test score to >18/28 to indicate a reduced risk for falling and demonstrate increased independence with functional mobility and ADLs.  Baseline: 10/04/2023: 8/28  11/16/2023: 16/28  01/22/2024: deferred 6/17: 20/28 03/21/24: 17/28  Goal status: Re-instated due to score below goal   6. Patient will increase ABC scale score >80% to demonstrate better functional mobility and better confidence with ADLs.   Baseline: 09/28/23: 17.5% 11/09/2023: 21.25% - pt reports he rated the test as is if he was not using his walker because this is his long term goal  01/04/2024: 35.625% - continuing to rate it as if he is not using his walker 03/21/24: 23% - rates as if he is not using SPC   Goal status: IN PROGRESS   ASSESSMENT:  CLINICAL IMPRESSION:   Pt responded well to the exercises and was able to increase the  resistance and performed well with the tasks given.  Postural stability was a focal point during the session as well and pt responded well to the extension based exercises.  Pt also brought in new cane and was hoping to have it adjusted and therapist assisted with this.  Pt was able to ambulate much better with the increased height on the cane.   Pt will continue to benefit from skilled therapy to address remaining deficits in order to improve overall QoL and return to PLOF.      OBJECTIVE IMPAIRMENTS: Abnormal gait, decreased activity tolerance, decreased balance, decreased coordination, decreased endurance, decreased knowledge of condition, decreased knowledge of use of DME, decreased mobility, difficulty walking, decreased ROM, decreased strength, decreased safety awareness, improper body mechanics, postural dysfunction, and pain.   ACTIVITY LIMITATIONS: carrying, lifting, bending, standing, squatting, sleeping, stairs, transfers, bed mobility, continence, bathing, toileting, dressing, reach over head, hygiene/grooming, and locomotion level  PARTICIPATION LIMITATIONS: meal prep, cleaning, laundry, and community activity  PERSONAL FACTORS: Fitness and 3+ comorbidities: Vitamin D deficiency, Hyperlipidemia, and HTN are also affecting patient's functional outcome.   REHAB POTENTIAL: Good  CLINICAL DECISION MAKING: Evolving/moderate  complexity  EVALUATION COMPLEXITY: Moderate  PLAN:  PT FREQUENCY: 1-2x/week  PT DURATION: 12 weeks  PLANNED INTERVENTIONS: 97164- PT Re-evaluation, 97110-Therapeutic exercises, 97530- Therapeutic activity, [id number]- Neuromuscular re-education, 97535- Self Care, 02859- Manual therapy, [phone]- Gait training, [phone]- Orthotic Fit/training, [phone]- Canalith repositioning, [phone]- Electrical stimulation (manual), Patient/Family education, Balance training, Stair training, Joint mobilization, Vestibular training, Visual/preceptual remediation/compensation, DME instructions,  Cryotherapy, and Moist heat  PLAN FOR NEXT SESSION:   Postural strengthening and positioning.  Large amplitude movement. Safety with gait and at home AD.    Fonda Simpers, PT, DPT Physical Therapist - [facility]  03/28/24, 2:47 PM

## 2024-03-28 NOTE — Addendum Note (Signed)
 Addended by: SILVER FONDA ORN on: 03/28/2024 09:48 PM   Modules accepted: Orders

## 2024-03-29 NOTE — Therapy (Addendum)
 Occupational Therapy Neuro Treatment Note   Patient Name: Daniel Daugherty MRN: [id number] DOB:February 14, 1966, 58 y.o., male Today's Date: 03/29/2024  PCP: N/A REFERRING PROVIDER: Odell Tor Edra CINDERELLA, MD  END OF SESSION:  OT End of Session - 03/29/24 0818     Visit Number 32    Number of Visits 48    Date for OT Re-Evaluation 06/15/24    OT Start Time 1300    OT Stop Time 1345    OT Time Calculation (min) 45 min    Activity Tolerance Patient tolerated treatment well    Behavior During Therapy WFL for tasks assessed/performed               Past Medical History:  Diagnosis Date   Hypertension    Past Surgical History:  Procedure Laterality Date   DG HAND LEFT COMPLETE ([facility] HX) Left 04/13/1999   There are no active problems to display for this patient.   ONSET DATE: 05/2021  REFERRING DIAG: Parkinson's Disease  THERAPY DIAG:  Muscle weakness (generalized)  Rationale for Evaluation and Treatment: Rehabilitation  SUBJECTIVE:   SUBJECTIVE STATEMENT:  Pt reports that  he got a brand new cane, and needs to have it adjusted. Pt accompanied by: self  PERTINENT HISTORY:   Pt. is a 58 y.o. male who was diagnosed with Parkinson's Disease in 2022. Pt. reports having had a progressive decline in function since having hernia surgery, and right shoulder rotator cuff surgery. PMHx included: HTN. Of note; Pt. was being followed by [facility], and receiving Home health therapy services. Pt. changed physicians, and was referred for outpatient rehab services here.  PRECAUTIONS: None  WEIGHT BEARING RESTRICTIONS: No  PAIN:  Are you having pain? 0/10 pain  FALLS: Has patient fallen in last 6 months? No, 1 small slip near the bed tripped over shoes  LIVING ENVIRONMENT: Lives with: lives alone Lives in: [place],  Stairs: Entry level Has following equipment at home: Quad cane small base, rollator, shower chair, grab bars, hand rail at [employer]  PLOF: Independent  PATIENT  GOALS: Improve self-care  OBJECTIVE:  Note: Objective measures were completed at Evaluation unless otherwise noted.  HAND DOMINANCE: Right  ADLs:  Assist at home:  Ex wife, and children assist several times a week  Transfers/ambulation related to ADLs: Modified Independent Eating: Difficulty using dominant right hand. Drops food when scooping, Both hands for stabilizing drinks, and uses a straw. Unable to cut food.  Grooming: Increased time required for all grooming tasks. UB Dressing: Difficulty managing buttons LB Dressing: Less assist with pants with elastic, more assist with dress pants Toileting: Increased time for hygiene. Bathing: Requires increased time to complete Tub Shower transfers: Modified Independence with increased time  IADLs: Shopping: Son assists with grocery shopping Light housekeeping: Son's assist. Pt. Does light rising off of dishes. Meal Prep: Increased time. Independent with microwave, and air fryer. Difficulty opening bottles, containers Community mobility:  Transportation, light driving to grocery store Medication management:  Son's set-up weekly pillbox, Pt. Is responsible for taking the medications Financial management: Son's assist with monthly finances Handwriting: 50% legible Hobbies: Dancing, watching sports Career/work: Restaurant manager, fast food  MOBILITY STATUS: Uses a cane; shuffling gait  FUNCTIONAL OUTCOME MEASURES:  Mam-20 sum score: 53/80, MAM Measure score: 52.4   UPPER EXTREMITY ROM:    Active ROM Right eval Right 11/09/23 Right  01/04/24 Right 03/23/24 Left Eval [facility] overall  Shoulder flexion WFL [phone]) [phone])   Shoulder abduction 52(100) 60(110) 31(881) 72(110)   Shoulder  adduction       Shoulder extension       Shoulder internal rotation       Shoulder external rotation       Elbow flexion [facility] WFL   Elbow extension [facility] WFL   Wrist flexion       Wrist extension [facility] WFL   Wrist ulnar  deviation       Wrist radial deviation       Wrist pronation       Wrist supination       (Blank rows = not tested)  UPPER EXTREMITY MMT:     MMT Right eval Right 11/09/23 Right 01/04/24 Right 03/23/24 Left Eval 4-/5 overall Left 11/09/23 4/5 Overall  Shoulder flexion 3/5 3-/5 3-/5 3-/5    Shoulder abduction 2/5 2/5 2+/5 3-/5    Shoulder adduction        Shoulder extension        Shoulder internal rotation        Shoulder external rotation        Middle trapezius        Lower trapezius        Elbow flexion 3/5 3+/5 3+/5 4-/5    Elbow extension 3/5 3+5 4-/5 4-/5    Wrist flexion        Wrist extension 3/5 3/5 3/5 3+/5    Wrist ulnar deviation        Wrist radial deviation        Wrist pronation        Wrist supination        (Blank rows = not tested)  HAND FUNCTION:   Eval: Grip strength: Right: 50 lbs; Left: 34 lbs, Lateral pinch: Right: 17 lbs, Left: 9 lbs, 3 point pinch: Right: 12 lbs, Left: 11 lbs  11/09/23: Grip strength: Right: 60 lbs; Left: 35 lbs, Lateral pinch: Right: 19 lbs, Left: 11 lbs, 3 point pinch: Right: 13 lbs, Left: 9 lbs  01/04/24:Grip strength: Right: 40 lbs; Left: 41 lbs, Lateral pinch: Right: 17 lbs, Left: 8 lbs, 3 point pinch: Right: 9 lbs, Left: 10 lbs  03/23/24:Grip strength: Right: 74 lbs; Left: 55 lbs, Lateral pinch: Right: 15 lbs, Left: 10 lbs, 3 point pinch: Right: 9 lbs, Left: 11 lbs   COORDINATION:  Eval: 9 Hole Peg test: Right: 1 min. & 46 sec; Left: 53 sec  11/09/2023: 9 Hole Peg test: Right: 1 min. & 22 sec; Left: 46 sec  01/04/24: 9 Hole Peg test: Right: 1 min. & 11 sec; Left: 46 sec  03/23/24: 9 Hole Peg test: Right: 1 min.; Left: 46 sec   SENSATION:  Light touch: WFL Proprioception: WFL  EDEMA: N/A  COGNITION: Overall cognitive status: Within functional limits for tasks assessed  VISION:  Does not wear glasses.  VISION ASSESSMENT: To be further assessed in functional context  PERCEPTION: WFL  PRAXIS: Impaired: Motor  planning  TREATMENT DATE: 03/28/24   Therapeutic Ex.:   -BUE strengthening through reciprocal motion using the SciFit for 8 min. On level 4.0 with constant monitoring of the BUEs. Pt. Worked on changing, and alternating forward reverse position every 2 min.  -Pt. Performed pectoral stretches seated in a chair, and while standing at a wall.  Therapeutic Activities:   -facilitated writing skills formulating word lists while standing at the vertical board with the RUE sustained in elevation in preparation fro improving writing legibility, and speed. -Pt. Formulated a variety of word lists at multiple heights, and elevated angles. -Pt. Focused on holding, and stabilizing the pen in his hand for the duration of the task without needing to reposition the pen within his hand.   PATIENT EDUCATION: Education details: writing, pectoral stretches Person educated: Patient Education method: Explanation, Demonstration, Tactile cues, and Verbal cues Education comprehension: needs further education  HOME EXERCISE PROGRAM: Writing tasks   GOALS: Goals reviewed with patient? Yes  SHORT TERM GOALS: Target date: 05/04/2024   Pt. Will be independent with HEPs for RUE. Baseline: 03/23/24: Independent, ongoing 01/04/24: Independent 11/09/2023: Independent Eval: No current HEP Goal status:Ongoing  LONG TERM GOALS: Target date: 06/15/2024  1.  Pt. Will increase right shoulder abduction by 10 degrees to  be able to reach up for hair care/grooming. Baseline: 03/23/24: 72(110) Pt. is independent with donning his shirt. However, requires increased time. 03/21/24: Pt. requires increased time to donn his shirt. 01/04/24: 31(881) Pt. reports having donning his shirt is easier with his new routine 11/09/23: Right 60/(110) Eval: Right 52(100) Goal status:  Achieved, revised  (03/23/24) to be able to  perform hair care.  2.  Pt. Will increase BUE strength by 2 mm grades to assist with ADLs. Baseline:03/23/24: Right: shoulder flexion: 3-/5, abduction: 3-/5, elbow flexion 4-/5, extension: 4-/5, wrist extension: 3+/5. Eval: Right: shoulder flexion: 3/5, abduction: 2/5, elbow flexion/extension: 3/5, wrist extension: 3/5. Left: 4-/5 overall  03/21/24: Right: shoulder flexion: 3-/5, abduction: 3-/5, elbow flexion 4-/5, extension: 4-/5, wrist extension: 3/5. Left: 4+/5 overall Eval: Right: shoulder flexion: 3/5, abduction: 2/5, elbow flexion/extension: 3/5, wrist extension: 3/5. Left: 4-/5 overall  01/04/24:  Right: shoulder flexion: 3-/5, abduction: 2/5, elbow flexion 4-/5, extension: 3+/5, wrist extension: 3/5. Left: 4/5 overallEval: Right: shoulder flexion: 3/5, abduction: 2/5, elbow flexion/extension: 3/5, wrist extension: 3/5. Left: 4-/5 overall4/08/25: Right: shoulder flexion: 3-/5, abduction: 2/5, elbow flexion/extension: 3+/5, wrist extension: 3/5. Left: 4/5 overallEval: Right: shoulder flexion: 3/5, abduction: 2/5, elbow flexion/extension: 3/5, wrist extension: 3/5. Left: 4-/5 overall Goal status: Ongoing  3.  Pt. Will perform self-feeding with modified independence Baseline: 03/23/2024: Pt. Requires set-up, and assist to perform scooping action, and to perform hand to mouth patterns. Pt. could benefit from an adaptive utensil.03/21/24: Pt. Requires is improving with hand to mouth patterns,however continues to have difficulty scooping his food, and consistency with hand to mouth patterns. 01/04/24: Pt. reports making improvements with self-feeding. Pt. has difficulty at times with bringing his spoon to his mouth, often bringing an empty spoon to his mouth 11/09/23: Pt. Continues to have difficulty using dominant right hand. Drops food when scooping, Both hands for stabilizing drinks, and uses a straw. Unable to cut food. Eval: Difficulty using dominant right hand. Drops food when scooping, Both hands for  stabilizing drinks, and uses a straw. Unable to cut food. Goal status: Ongoing  4.  Pt. Will demonstrate independence with proper A/E techniques/compensatory strategies for ADL/IADLs.  Baseline: 03/23/2024:  Continue 03/21/24: Continue 01/04/24: Continue ongoing as needed. 11/09/23:  Continue Eval: Education to be provided Goal status: Ongoing  5.  Pt. Will write a sentence with 75% legibility with modified independence Baseline: 03/23/24: 1 min. & 52 sec. to complete one sentence with 60% legibility. Pt. Presents with micrographia towards the end of the sentence. 03/21/24: Continue 01/04/24:  One sentence completed in 2 min. & 51 sec. With 50% legibility.11/09/2023: 50% legibility for one sentence. Eval: 50% legibility for one printed sentence Goal status: Ongoing   6.  Pt. Will improve bilateral [facility] skills by 3 sec. on the 9 Hole Peg Test to be able to manipulate small objects.  Baseline: 03/23/24: 9 Hole Peg test: Right: 1 min.; Left: 46 sec. 03/21/24: Pt. Has difficulty efficiently manipulating small objects. Pt. 9 Hole Peg Test: R: 1 min. & 11 sec.  L: 46 sec. 11/09/2023: 9 Hole Peg test: Right: 1 min. & 22 sec; Left: 46 sec Eval: 9 Hole Peg test: Right: 1 min. & 46 sec; Left: 53 sec Goal status: Ongoing  7. Pt. Will improve the Mam-20 score by 5 points to reflect ADL/IADL improvement. Baseline:03/23/2024: TBD; Eval: Mam-20 sum score: 53/80, Mam Measure score: 52.4 Goal status  Ongoing  8. Pt. will improve hand function skills to be able independently, and efficiently pick up, and place items (tv remotes/utensils/pens) into the proper position without using assistance from the left hand.  Baseline: 03/23/2024: Pt. Requires  assist from the left hand to position items within his right hand in preparation for using the item.    Goal status: New   ASSESSMENT:  CLINICAL IMPRESSION:            Pt. was able to formulate lists of words while standing at a vertical angle with 100% legibility with minimal  shakiness of the letters. Pt. required seated rest breaks between each set of word lists. Pt required increased time. Pt. Was able to maintain a mature grasp on the pen for the duration of the task. Pt. Could benefit from progressing from word lists to  sentences. Pt. continues to benefit from OT services to work on improving overall bilateral UE functioning in order to maximize engagement in, and efficiency in ADLs, and IADL tasks, and provide education about compensatory strategies.    PERFORMANCE DEFICITS: in functional skills including ADLs, IADLs, coordination, dexterity, proprioception, sensation, ROM, strength, pain, Fine motor control, Gross motor control, and UE functional use, cognitive skills including , and psychosocial skills including coping strategies, environmental adaptation, and routines and behaviors.   IMPAIRMENTS: are limiting patient from ADLs, IADLs, and leisure.   CO-MORBIDITIES: may have co-morbidities  that affects occupational performance. Patient will benefit from skilled OT to address above impairments and improve overall function.  MODIFICATION OR ASSISTANCE TO COMPLETE EVALUATION: Min-Moderate modification of tasks or assist with assess necessary to complete an evaluation.  OT OCCUPATIONAL PROFILE AND HISTORY: Detailed assessment: Review of records and additional review of physical, cognitive, psychosocial history related to current functional performance.  CLINICAL DECISION MAKING: Moderate - several treatment options, min-mod task modification necessary  REHAB POTENTIAL: Good  EVALUATION COMPLEXITY: Moderate    PLAN:  OT FREQUENCY: 2x/week  OT DURATION: 12 weeks  PLANNED INTERVENTIONS: 97168 OT Re-evaluation, 97535 self care/ADL training, 02889 therapeutic exercise, 97530 therapeutic activity, 97112 neuromuscular re-education, 97140 manual therapy, 97018 paraffin, 02989 moist heat, 97034 contrast bath, 97760 Orthotics management and training, 02239 Splinting  (initial encounter), energy conservation, patient/family education, and DME and/or AE instructions  RECOMMENDED OTHER SERVICES:   PT  CONSULTED AND AGREED WITH  PLAN OF CARE: Patient  PLAN FOR NEXT SESSION:   Treatment  Savannah Erbe, MS, OTR/L   03/29/2024, 8:19 AM

## 2024-03-30 ENCOUNTER — Ambulatory Visit: Admitting: Physical Therapy

## 2024-03-30 ENCOUNTER — Ambulatory Visit

## 2024-03-30 DIAGNOSIS — M6281 Muscle weakness (generalized): Secondary | ICD-10-CM

## 2024-03-30 DIAGNOSIS — R269 Unspecified abnormalities of gait and mobility: Secondary | ICD-10-CM

## 2024-03-30 DIAGNOSIS — R262 Difficulty in walking, not elsewhere classified: Secondary | ICD-10-CM

## 2024-03-30 DIAGNOSIS — R2681 Unsteadiness on feet: Secondary | ICD-10-CM

## 2024-03-30 DIAGNOSIS — R278 Other lack of coordination: Secondary | ICD-10-CM

## 2024-03-30 DIAGNOSIS — R531 Weakness: Secondary | ICD-10-CM

## 2024-03-30 NOTE — Therapy (Signed)
 OUTPATIENT PHYSICAL THERAPY TREATMENT  Patient Name: Daniel Daugherty MRN: [id number] DOB:[birth year], 58 y.o., male Today's Date: 03/30/2024  PCP: Odell Chard, Edra GRADE, MD  REFERRING PROVIDER: Odell Chard, Edra GRADE, MD   END OF SESSION:    PT End of Session - 03/30/24 1110     Visit Number 33    Number of Visits 48    Date for PT Re-Evaluation 05/23/24    Authorization Type Medicare 2025    Progress Note Due on Visit 30    PT Start Time 1110    PT Stop Time 1148    PT Time Calculation (min) 38 min    Equipment Utilized During Treatment Gait belt    Activity Tolerance Patient tolerated treatment well    Behavior During Therapy WFL for tasks assessed/performed            Past Medical History:  Diagnosis Date   Hypertension    Past Surgical History:  Procedure Laterality Date   DG HAND LEFT COMPLETE ([facility] HX) Left 04/13/1999   There are no active problems to display for this patient.  ONSET DATE: 2022 is when he noticed the change and that was around the same time he was diagnosed with Parkinson's  REFERRING DIAG:  G20.A1 (ICD-10-CM) - Parkinson disease (HCC)  Z74.1 (ICD-10-CM) - Requires daily assistance for activities of daily living (ADL) and comfort needs   THERAPY DIAG:  Generalized weakness  Muscle weakness (generalized)  Other lack of coordination  Difficulty in walking, not elsewhere classified  Unsteadiness on feet  Abnormality of gait  Rationale for Evaluation and Treatment: Rehabilitation  SUBJECTIVE:                                                                                                                                                                                             SUBJECTIVE STATEMENT:    Pt states he has been doing alright. Denies pain to start session. Denies stumbles/falls. Pt states he went on a cruise to [place] recently.   PERTINENT HISTORY: Parkinsonism, Vitamin D deficiency, Hyperlipidemia, HTN, poor  medication compliance (per chart review)  PAIN:  Are you having pain? Rt shoulder/neck 4-5/10.   PRECAUTIONS: Fall  RED FLAGS: None   WEIGHT BEARING RESTRICTIONS: No  FALLS: Has patient fallen in last 6 months? Yes. 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   PATIENT GOALS: Get my balance right with my walking, help get my R arm stronger and be able to move it more, improve strength in my legs (specifically the Right)  OBJECTIVE:  Note: Objective measures were completed at Evaluation unless otherwise noted.                                                                                                                             TREATMENT DATE: 03/30/24  Unless otherwise stated, CGA was provided and gait belt donned in order to ensure pt safety throughout session.  Pt ambulates into therapy clinic using cane with close SBA.  B UE and B LE cyclical movements on Octane for priming activation of reciprocal movement patterns and cardiovascular training against the following levels:  Level 1 resistance for 2 minutes Level 2 for 2 minutes Level 3 for 1 minutes Totaling 5 minutes and 0.38 mi Average RPM: 30  Patient expresses interest in having access to this type of machine outside of therapy - initiated discussion about [facility], but will benefit from follow-up education and being shown the space and  receiving help signing up.   Sit<>stands from green chair with shoulder extension in standing to promote upright posture 2x 10reps - patient demonstrates improvement in upright posture compared to last time seen by this therapist!  Gait training 348ft, no AD, with close SBA/CGA for safety during dual-task challenge of reciprocal stepping patterns and arm swing wearing boxing gloves to hit external targets to promote larger amplitude of movements. Intermittent cuing to maintain upright posture and not have progressive forward trunk flexion/momentum.   Dynamic stepping balance with dual-task challenges as follows, while wearing 3lb AWs on each LE:  Side stepping over 1/2 foam roll then cross body reaching to punch external target wearing boxing gloves X12 reps Size of lateral steps improved with repetition Forward/backwards stepping over 1/2 foam roll with 2 punches to external target after each forward step, wearing boxing gloves X10 reps per LE Has difficult clearing R LE when stepping back leading with L LE first +2 A providing the external target to punch towards while therapist providing CGA/min A for balance     PATIENT EDUCATION: Education details: exercise technique, reassessment  Person educated: Patient Education method: Explanation, demo, vc Education comprehension: verbalized understanding and needs further education  HOME EXERCISE PROGRAM:  Access Code: [email or web address] URL: [email or web address] Date: 12/02/2023 Prepared by: Connell Kiss  Exercises - Sidelying Thoracic Rotation with Open Book  - 1 x daily - 7 x weekly - 2 sets - 10 reps - Supine Shoulder External Rotation in Abduction  - 1 x daily - 7 x weekly - 2 sets - 10 reps - 10  seconds hold - Seated Scapular Retraction  - 1 x daily - 7 x weekly - 3 sets - 10 reps - 3 seconds hold - Sit to Stand with Arm Swing  - 1 x daily - 7 x weekly - 2 sets - 10 reps   GOALS: Goals reviewed with patient?  Yes  SHORT TERM GOALS: Target date: 04/27/2024    Patient will be independent in home exercise program to improve strength/mobility for better functional independence with ADLs. Baseline: need to initiate 10/07/2023: provided 12/02/2023: updated Goal status: IN PROGRESS   LONG TERM GOALS: Target date: 05/23/2024   1.  Patient (< 32 years old) will complete five times sit to stand test in < 12 seconds indicating an increased LE strength and improved balance. Baseline: 09/28/23: 38.53 seconds 11/09/2023: 22.13 seconds with arms across chest 01/04/2024: 20.12 seconds without UE support 03/21/24: 17.20 seconds without UE support  Goal status: IN PROGRESS   2. Patient will reduce timed up and go to <11 seconds to reduce fall risk and demonstrate improved transfer/gait ability. Baseline: 09/28/23: 19.46 seconds without AD 11/09/2023: 16.96 seconds, no AD, with close SBA for balance safety 01/04/2024: 15.845 seconds, no AD with close SBA for safety 03/21/24: 14.83 seconds, no AD with close SBA for safety  Goal status: IN PROGRESS  3.  Patient will increase 10 meter walk test to >1.46m/s as to improve gait speed for better community ambulation and to reduce fall risk. Baseline: 09/28/23: 0.43m/s without AD 11/09/2023: 0.89 m/s (11.50 seconds seconds 10.94 seconds) no AD, with close SBA for safety 01/04/2024: 0.935 m/s without AD, CGA/close SBA for safety 03/21/24: 0.8 m/s without AD, CGA/close SBA for safety Goal status: IN PROGRESS  4.  Patient will increase six minute walk test distance to >1068ft for progression to community ambulator and improve gait ability Baseline: 10/04/2023: 565ft (179m at 0.34m/s avg) 11/09/2023: 1061 feet (323 meters, Avg speed 0.897 m/s) without AD Goal status: MET and Advanced, ONGOING with updated goal 11/09/2023 Goal Updated: distance > 1354ft 01/13/2024: 832  ft mostly without SPC but then pt needed SPC due to fatigue, required one break (standing) 03/21/24: 750' with mostly SPC  due to fatigue    5. Patient will increase MiniBest Test score to >18/28 to indicate a reduced risk for falling and demonstrate increased independence with functional mobility and ADLs.  Baseline: 10/04/2023: 8/28  11/16/2023: 16/28  01/22/2024: deferred 6/17: 20/28 03/21/24: 17/28  Goal status: Re-instated due to score below goal   6. Patient will increase ABC scale score >80% to demonstrate better functional mobility and better confidence with ADLs.   Baseline: 09/28/23: 17.5% 11/09/2023: 21.25% - pt reports he rated the test as is if he was not using his walker because this is his long term goal  01/04/2024: 35.625% - continuing to rate it as if he is not using his walker 03/21/24: 23% - rates as if he is not using SPC   Goal status: IN PROGRESS   ASSESSMENT:  CLINICAL IMPRESSION:  Patient arrived highly motivated to participate in therapy session. Focus of therapy session today was on reciprocal movement patterns with larger amplitude and increased speed while also reinforcing maintaining upright posture throughout. Patient demonstrates significant improvement in his upright posture compared to last time seen by this therapist. Patient also continues to benefit from external targets to improve size of extremity movements. Pt will continue to benefit from skilled therapy to address remaining deficits in order to improve overall QoL and return to PLOF.  OBJECTIVE IMPAIRMENTS: Abnormal gait, decreased activity tolerance, decreased balance, decreased coordination, decreased endurance, decreased knowledge of condition, decreased knowledge of use of DME, decreased mobility, difficulty walking, decreased ROM, decreased strength, decreased safety awareness, improper body mechanics, postural dysfunction, and pain.   ACTIVITY LIMITATIONS: carrying, lifting, bending, standing, squatting, sleeping, stairs, transfers, bed mobility, continence, bathing, toileting, dressing, reach over head,  hygiene/grooming, and locomotion level  PARTICIPATION LIMITATIONS: meal prep, cleaning, laundry, and community activity  PERSONAL FACTORS: Fitness and 3+ comorbidities: Vitamin D deficiency, Hyperlipidemia, and HTN are also affecting patient's functional outcome.   REHAB POTENTIAL: Good  CLINICAL DECISION MAKING: Evolving/moderate complexity  EVALUATION COMPLEXITY: Moderate  PLAN:  PT FREQUENCY: 1-2x/week  PT DURATION: 12 weeks  PLANNED INTERVENTIONS: 97164- PT Re-evaluation, 97110-Therapeutic exercises, 97530- Therapeutic activity, 97112- Neuromuscular re-education, 97535- Self Care, [id number]- Manual therapy, [phone]- Gait training, [phone]- Orthotic Fit/training, [phone]- Canalith repositioning, [phone]- Electrical stimulation (manual), Patient/Family education, Balance training, Stair training, Joint mobilization, Vestibular training, Visual/preceptual remediation/compensation, DME instructions, Cryotherapy, and Moist heat  PLAN FOR NEXT SESSION:  Follow-up on interest in joining [place] to allow him to use Octane device outside of therapy Continue postural strengthening and positioning.  Large amplitude movement. Safety with gait and at home AD.     Connell Kiss, PT, DPT, NCS, CSRS Physical Therapist - [place]  [facility]  11:52 AM 03/30/24

## 2024-04-01 NOTE — Therapy (Signed)
 Occupational Therapy Neuro Treatment Note   Patient Name: Daniel Daugherty MRN: [id number] DOB:Aug 05, 1965, 58 y.o., male Today's Date: 04/01/2024  PCP: N/A REFERRING PROVIDER: Odell Tor Edra CINDERELLA, MD  END OF SESSION:  OT End of Session - 04/01/24 1118     Visit Number 33    Number of Visits 48    Date for OT Re-Evaluation 06/15/24    OT Start Time 1145    OT Stop Time 1230    OT Time Calculation (min) 45 min    Activity Tolerance Patient tolerated treatment well    Behavior During Therapy WFL for tasks assessed/performed            Past Medical History:  Diagnosis Date   Hypertension    Past Surgical History:  Procedure Laterality Date   DG HAND LEFT COMPLETE ([facility] HX) Left 04/13/1999   There are no active problems to display for this patient.  ONSET DATE: 05/2021  REFERRING DIAG: Parkinson's Disease  THERAPY DIAG:  Muscle weakness (generalized)  Other lack of coordination  Rationale for Evaluation and Treatment: Rehabilitation  SUBJECTIVE:  SUBJECTIVE STATEMENT: Pt reports being agreeable to try supine UB stretching on therapy mat table. Pt accompanied by: self  PERTINENT HISTORY:   Pt. is a 58 y.o. male who was diagnosed with Parkinson's Disease in 2022. Pt. reports having had a progressive decline in function since having hernia surgery, and right shoulder rotator cuff surgery. PMHx included: HTN. Of note; Pt. was being followed by [facility], and receiving Home health therapy services. Pt. changed physicians, and was referred for outpatient rehab services here.  PRECAUTIONS: None  WEIGHT BEARING RESTRICTIONS: No  PAIN:  Are you having pain? 0/10 pain  FALLS: Has patient fallen in last 6 months? No, 1 small slip near the bed tripped over shoes  LIVING ENVIRONMENT: Lives with: lives alone Lives in: [place],  Stairs: Entry level Has following equipment at home: Quad cane small base, rollator, shower chair, grab bars, hand rail at [employer]  PLOF:  Independent  PATIENT GOALS: Improve self-care  OBJECTIVE:  Note: Objective measures were completed at Evaluation unless otherwise noted.  HAND DOMINANCE: Right  ADLs:  Assist at home:  Ex wife, and children assist several times a week  Transfers/ambulation related to ADLs: Modified Independent Eating: Difficulty using dominant right hand. Drops food when scooping, Both hands for stabilizing drinks, and uses a straw. Unable to cut food.  Grooming: Increased time required for all grooming tasks. UB Dressing: Difficulty managing buttons LB Dressing: Less assist with pants with elastic, more assist with dress pants Toileting: Increased time for hygiene. Bathing: Requires increased time to complete Tub Shower transfers: Modified Independence with increased time  IADLs: Shopping: Son assists with grocery shopping Light housekeeping: Son's assist. Pt. Does light rising off of dishes. Meal Prep: Increased time. Independent with microwave, and air fryer. Difficulty opening bottles, containers Community mobility:  Transportation, light driving to grocery store Medication management:  Son's set-up weekly pillbox, Pt. Is responsible for taking the medications Financial management: Son's assist with monthly finances Handwriting: 50% legible Hobbies: Dancing, watching sports Career/work: Restaurant manager, fast food  MOBILITY STATUS: Uses a cane; shuffling gait  FUNCTIONAL OUTCOME MEASURES:  Mam-20 sum score: 53/80, MAM Measure score: 52.4   UPPER EXTREMITY ROM:    Active ROM Right eval Right 11/09/23 Right  01/04/24 Right 03/23/24 Left Eval [facility] overall  Shoulder flexion WFL [phone])   Shoulder abduction 52(100) 60(110) 31(881) 72(110)   Shoulder adduction  Shoulder extension       Shoulder internal rotation       Shoulder external rotation       Elbow flexion [facility] WFL   Elbow extension [facility] WFL   Wrist flexion       Wrist extension [facility]  WFL   Wrist ulnar deviation       Wrist radial deviation       Wrist pronation       Wrist supination       (Blank rows = not tested)  UPPER EXTREMITY MMT:     MMT Right eval Right 11/09/23 Right 01/04/24 Right 03/23/24 Left Eval 4-/5 overall Left 11/09/23 4/5 Overall  Shoulder flexion 3/5 3-/5 3-/5 3-/5    Shoulder abduction 2/5 2/5 2+/5 3-/5    Shoulder adduction        Shoulder extension        Shoulder internal rotation        Shoulder external rotation        Middle trapezius        Lower trapezius        Elbow flexion 3/5 3+/5 3+/5 4-/5    Elbow extension 3/5 3+5 4-/5 4-/5    Wrist flexion        Wrist extension 3/5 3/5 3/5 3+/5    Wrist ulnar deviation        Wrist radial deviation        Wrist pronation        Wrist supination        (Blank rows = not tested)  HAND FUNCTION:   Eval: Grip strength: Right: 50 lbs; Left: 34 lbs, Lateral pinch: Right: 17 lbs, Left: 9 lbs, 3 point pinch: Right: 12 lbs, Left: 11 lbs  11/09/23: Grip strength: Right: 60 lbs; Left: 35 lbs, Lateral pinch: Right: 19 lbs, Left: 11 lbs, 3 point pinch: Right: 13 lbs, Left: 9 lbs  01/04/24:Grip strength: Right: 40 lbs; Left: 41 lbs, Lateral pinch: Right: 17 lbs, Left: 8 lbs, 3 point pinch: Right: 9 lbs, Left: 10 lbs  03/23/24:Grip strength: Right: 74 lbs; Left: 55 lbs, Lateral pinch: Right: 15 lbs, Left: 10 lbs, 3 point pinch: Right: 9 lbs, Left: 11 lbs   COORDINATION:  Eval: 9 Hole Peg test: Right: 1 min. & 46 sec; Left: 53 sec  11/09/2023: 9 Hole Peg test: Right: 1 min. & 22 sec; Left: 46 sec  01/04/24: 9 Hole Peg test: Right: 1 min. & 11 sec; Left: 46 sec  03/23/24: 9 Hole Peg test: Right: 1 min.; Left: 46 sec   SENSATION:  Light touch: WFL Proprioception: WFL  EDEMA: N/A  COGNITION: Overall cognitive status: Within functional limits for tasks assessed  VISION:  Does not wear glasses.  VISION ASSESSMENT: To be further assessed in functional context  PERCEPTION:  WFL  PRAXIS: Impaired: Motor planning  TREATMENT DATE: 03/30/24 Therapeutic Exercise: -UB stretching on mat table including: including thoracic ext achieved with positioning strategies noted below, passive bilat shoulder retraction and depression, passive R/L shoulder flex, abd, horiz abd/add, abd, ER/IR within pain free ranges  -Thoracic extension to tolerance achieved with pt supine on mat table and wedge mat with 2 pillows to prop UB and head, progressing from 2 to 1 pillow beneath head by end of session. -AAROM with cane to perform bilat shoulder flexion overhead (supine).  Pt required mod A to achieve end range d/t motor planning deficits.  Pt reported his son could help him with this at home.  OT encouraged pt try in recliner or supine in bed with pillows to prop him for comfort.  Self Care: -HEP review; encouraged son help facilitate BUE cane stretches in recliner d/t pt showing difficulty motor planning to achieve max end range stretch -Condition management education: encouraged pt consider adding a neurologist to his care plan to help manage parkinson's symptoms.  Pt reports he doesn't see a neurologist because they'll just want him to take more pills, and he doesn't like taking pills.  PATIENT EDUCATION: Education details: UB stretching techniques Person educated: Patient Education method: Explanation, Demonstration, Tactile cues, and Verbal cues Education comprehension: needs further education  HOME EXERCISE PROGRAM: Writing tasks, cane stretches in recliner for UB flexibility  GOALS: Goals reviewed with patient? Yes  SHORT TERM GOALS: Target date: 05/04/2024   Pt. Will be independent with HEPs for RUE. Baseline: 03/23/24: Independent, ongoing 01/04/24: Independent 11/09/2023: Independent Eval: No current HEP Goal status:Ongoing  LONG TERM GOALS: Target date:  06/15/2024  1.  Pt. Will increase right shoulder abduction by 10 degrees to  be able to reach up for hair care/grooming. Baseline: 03/23/24: 72(110) Pt. is independent with donning his shirt. However, requires increased time. 03/21/24: Pt. requires increased time to donn his shirt. 01/04/24: 31(881) Pt. reports having donning his shirt is easier with his new routine 11/09/23: Right 60/(110) Eval: Right 52(100) Goal status:  Achieved, revised  (03/23/24) to be able to perform hair care.  2.  Pt. Will increase BUE strength by 2 mm grades to assist with ADLs. Baseline:03/23/24: Right: shoulder flexion: 3-/5, abduction: 3-/5, elbow flexion 4-/5, extension: 4-/5, wrist extension: 3+/5. Eval: Right: shoulder flexion: 3/5, abduction: 2/5, elbow flexion/extension: 3/5, wrist extension: 3/5. Left: 4-/5 overall  03/21/24: Right: shoulder flexion: 3-/5, abduction: 3-/5, elbow flexion 4-/5, extension: 4-/5, wrist extension: 3/5. Left: 4+/5 overall Eval: Right: shoulder flexion: 3/5, abduction: 2/5, elbow flexion/extension: 3/5, wrist extension: 3/5. Left: 4-/5 overall  01/04/24:  Right: shoulder flexion: 3-/5, abduction: 2/5, elbow flexion 4-/5, extension: 3+/5, wrist extension: 3/5. Left: 4/5 overallEval: Right: shoulder flexion: 3/5, abduction: 2/5, elbow flexion/extension: 3/5, wrist extension: 3/5. Left: 4-/5 overall4/08/25: Right: shoulder flexion: 3-/5, abduction: 2/5, elbow flexion/extension: 3+/5, wrist extension: 3/5. Left: 4/5 overallEval: Right: shoulder flexion: 3/5, abduction: 2/5, elbow flexion/extension: 3/5, wrist extension: 3/5. Left: 4-/5 overall Goal status: Ongoing  3.  Pt. Will perform self-feeding with modified independence Baseline: 03/23/2024: Pt. Requires set-up, and assist to perform scooping action, and to perform hand to mouth patterns. Pt. could benefit from an adaptive utensil.03/21/24: Pt. Requires is improving with hand to mouth patterns,however continues to have difficulty scooping his food,  and consistency with hand to mouth patterns. 01/04/24: Pt. reports making improvements with self-feeding. Pt. has difficulty at times with bringing his spoon to his mouth, often bringing an empty spoon to his mouth 11/09/23: Pt. Continues to have difficulty using  dominant right hand. Drops food when scooping, Both hands for stabilizing drinks, and uses a straw. Unable to cut food. Eval: Difficulty using dominant right hand. Drops food when scooping, Both hands for stabilizing drinks, and uses a straw. Unable to cut food. Goal status: Ongoing  4.  Pt. Will demonstrate independence with proper A/E techniques/compensatory strategies for ADL/IADLs.  Baseline: 03/23/2024:  Continue 03/21/24: Continue 01/04/24: Continue ongoing as needed. 11/09/23: Continue Eval: Education to be provided Goal status: Ongoing  5.  Pt. Will write a sentence with 75% legibility with modified independence Baseline: 03/23/24: 1 min. & 52 sec. to complete one sentence with 60% legibility. Pt. Presents with micrographia towards the end of the sentence. 03/21/24: Continue 01/04/24:  One sentence completed in 2 min. & 51 sec. With 50% legibility.11/09/2023: 50% legibility for one sentence. Eval: 50% legibility for one printed sentence Goal status: Ongoing   6.  Pt. Will improve bilateral [facility] skills by 3 sec. on the 9 Hole Peg Test to be able to manipulate small objects.  Baseline: 03/23/24: 9 Hole Peg test: Right: 1 min.; Left: 46 sec. 03/21/24: Pt. Has difficulty efficiently manipulating small objects. Pt. 9 Hole Peg Test: R: 1 min. & 11 sec.  L: 46 sec. 11/09/2023: 9 Hole Peg test: Right: 1 min. & 22 sec; Left: 46 sec Eval: 9 Hole Peg test: Right: 1 min. & 46 sec; Left: 53 sec Goal status: Ongoing  7. Pt. Will improve the Mam-20 score by 5 points to reflect ADL/IADL improvement. Baseline:03/23/2024: TBD; Eval: Mam-20 sum score: 53/80, Mam Measure score: 52.4 Goal status  Ongoing  8. Pt. will improve hand function skills to be able  independently, and efficiently pick up, and place items (tv remotes/utensils/pens) into the proper position without using assistance from the left hand.  Baseline: 03/23/2024: Pt. Requires  assist from the left hand to position items within his right hand in preparation for using the item.    Goal status: New   ASSESSMENT:  CLINICAL IMPRESSION: Pt with good tolerance to passive stretching and supine positioning throughout session this date, using wedge mat and pillows to prop back/head to increase comfort and ease pt into new stretches.  Pt verbalized only mild pain on occasion for end range R shoulder flex/abd/ER (hx of R shoulder sx), to which OT immediately reduced end range to ensure pt comfort.  Pt verbalized stretches to feel really good, and reported eagerness to try cane stretches at home with his son's assist.  Pt observed with less kyphosis upon standing at end of session, though requiring verbal and tactile cues to maintain more erect standing posture, with OT reinforcing benefits of supine positioning and UB stretching to reduce thoracic and cervical kyposis, also contributing to shoulder protraction.  Pt. continues to benefit from OT services to work on improving overall bilateral UE functioning in order to maximize engagement in, and efficiency in ADLs, and IADL tasks, and provide education about compensatory strategies.    PERFORMANCE DEFICITS: in functional skills including ADLs, IADLs, coordination, dexterity, proprioception, sensation, ROM, strength, pain, Fine motor control, Gross motor control, and UE functional use, cognitive skills including , and psychosocial skills including coping strategies, environmental adaptation, and routines and behaviors.   IMPAIRMENTS: are limiting patient from ADLs, IADLs, and leisure.   CO-MORBIDITIES: may have co-morbidities  that affects occupational performance. Patient will benefit from skilled OT to address above impairments and improve overall  function.  MODIFICATION OR ASSISTANCE TO COMPLETE EVALUATION: Min-Moderate modification of tasks or assist with  assess necessary to complete an evaluation.  OT OCCUPATIONAL PROFILE AND HISTORY: Detailed assessment: Review of records and additional review of physical, cognitive, psychosocial history related to current functional performance.  CLINICAL DECISION MAKING: Moderate - several treatment options, min-mod task modification necessary  REHAB POTENTIAL: Good  EVALUATION COMPLEXITY: Moderate    PLAN:  OT FREQUENCY: 2x/week  OT DURATION: 12 weeks  PLANNED INTERVENTIONS: 97168 OT Re-evaluation, 97535 self care/ADL training, 02889 therapeutic exercise, 97530 therapeutic activity, 97112 neuromuscular re-education, 97140 manual therapy, 97018 paraffin, 02989 moist heat, 97034 contrast bath, 97760 Orthotics management and training, 02239 Splinting (initial encounter), energy conservation, patient/family education, and DME and/or AE instructions  RECOMMENDED OTHER SERVICES:   PT  CONSULTED AND AGREED WITH PLAN OF CARE: Patient  PLAN FOR NEXT SESSION:   Treatment  Inocente Blazing, MS, OTR/L  04/01/2024, 11:19 AM

## 2024-04-04 ENCOUNTER — Ambulatory Visit: Admitting: Physical Therapy

## 2024-04-04 ENCOUNTER — Ambulatory Visit: Admitting: Occupational Therapy

## 2024-04-04 NOTE — Therapy (Incomplete)
 OUTPATIENT PHYSICAL THERAPY TREATMENT  Patient Name: Daniel Daugherty MRN: [id number] DOB:1965-08-16, 58 y.o., male Today's Date: 04/04/2024  PCP: Odell Chard, Edra GRADE, MD  REFERRING PROVIDER: Odell Chard, Edra GRADE, MD   END OF SESSION:  ***      Past Medical History:  Diagnosis Date   Hypertension    Past Surgical History:  Procedure Laterality Date   DG HAND LEFT COMPLETE ([facility] HX) Left 04/13/1999   There are no active problems to display for this patient.  ONSET DATE: 2022 is when he noticed the change and that was around the same time he was diagnosed with Parkinson's  REFERRING DIAG:  G20.A1 (ICD-10-CM) - Parkinson disease (HCC)  Z74.1 (ICD-10-CM) - Requires daily assistance for activities of daily living (ADL) and comfort needs   THERAPY DIAG: *** No diagnosis found.  Rationale for Evaluation and Treatment: Rehabilitation  SUBJECTIVE:                                                                                                                                                                                             SUBJECTIVE STATEMENT:    *** interest in joining [employer] he has been doing alright. Denies pain to start session. Denies stumbles/falls. Pt states he went on a cruise to [place] recently.   PERTINENT HISTORY: Parkinsonism, Vitamin D deficiency, Hyperlipidemia, HTN, poor medication compliance (per chart review)  PAIN:  Are you having pain? Rt shoulder/neck 4-5/10.   PRECAUTIONS: Fall  RED FLAGS: None   WEIGHT BEARING RESTRICTIONS: No  FALLS: Has patient fallen in last 6 months? Yes. 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   PATIENT GOALS: Get my balance right with my walking, help get my R arm stronger and be able to move it more, improve strength in my legs (specifically the Right)  OBJECTIVE:  Note: Objective measures were completed at Evaluation unless otherwise noted.  TREATMENT DATE: 04/04/24  *** Follow-up on interest in joining [place] to allow him to use Octane device outside of therapy Continue postural strengthening and positioning.  Large amplitude movement. Safety with gait and at home AD.   Unless otherwise stated, CGA was provided and gait belt donned in order to ensure pt safety throughout session.  Pt ambulates into therapy clinic using cane with close SBA.  B UE and B LE cyclical movements on Octane for priming activation of reciprocal movement patterns and cardiovascular training against the following levels:  Level 1 resistance for 2 minutes Level 2 for 2 minutes Level 3 for 1 minutes Totaling 5 minutes and 0.38 mi Average RPM: 30  Patient expresses interest in having access to this type of machine outside of therapy - initiated discussion about [facility], but will benefit from follow-up education and being shown the space and receiving help signing up.   Sit<>stands from green chair with shoulder extension in standing to promote upright posture 2x 10reps - patient demonstrates improvement in upright posture compared to last time seen by this therapist!  Gait training 396ft, no AD, with close SBA/CGA for safety during dual-task challenge of reciprocal stepping patterns and arm swing  wearing boxing gloves to hit external targets to promote larger amplitude of movements. Intermittent cuing to maintain upright posture and not have progressive forward trunk flexion/momentum.   Dynamic stepping balance with dual-task challenges as follows, while wearing 3lb AWs on each LE:  Side stepping over 1/2 foam roll then cross body reaching to punch external target wearing boxing gloves X12 reps Size of lateral steps improved with repetition Forward/backwards stepping over 1/2 foam roll with 2 punches to external target after each forward step, wearing boxing gloves X10 reps per LE Has difficult clearing R LE when stepping back leading with L LE first +2 A providing the external target to punch towards while therapist providing CGA/min A for balance  ***     PATIENT EDUCATION: Education details: exercise technique, reassessment  Person educated: Patient Education method: Explanation, demo, vc Education comprehension: verbalized understanding and needs further education  HOME EXERCISE PROGRAM:  Access Code: [email or web address] URL: [email or web address] Date: 12/02/2023 Prepared by: Connell Kiss  Exercises - Sidelying Thoracic Rotation with Open Book  - 1 x daily - 7 x weekly - 2 sets - 10 reps - Supine Shoulder External Rotation in Abduction  - 1 x daily - 7 x weekly - 2 sets - 10 reps - 10 seconds hold - Seated Scapular Retraction  - 1 x daily - 7 x weekly - 3 sets - 10 reps - 3 seconds hold - Sit to Stand with Arm Swing  - 1 x daily - 7 x weekly - 2 sets - 10 reps   GOALS: Goals reviewed with patient? Yes  SHORT TERM GOALS: Target date: 04/27/2024    Patient will be independent in home exercise program to improve strength/mobility for better functional independence with ADLs. Baseline: need to initiate 10/07/2023: provided 12/02/2023: updated Goal status: IN PROGRESS   LONG TERM GOALS: Target date: 05/23/2024   1.  Patient (< 19 years old) will  complete five times sit to stand test in < 12 seconds indicating an increased LE strength and improved balance. Baseline: 09/28/23: 38.53 seconds 11/09/2023: 22.13 seconds with arms across chest 01/04/2024: 20.12 seconds without UE support 03/21/24: 17.20 seconds without UE support  Goal status: IN PROGRESS   2. Patient will reduce timed up and go to <11 seconds to reduce  fall risk and demonstrate improved transfer/gait ability. Baseline: 09/28/23: 19.46 seconds without AD 11/09/2023: 16.96 seconds, no AD, with close SBA for balance safety 01/04/2024: 15.845 seconds, no AD with close SBA for safety 03/21/24: 14.83 seconds, no AD with close SBA for safety  Goal status: IN PROGRESS  3.  Patient will increase 10 meter walk test to >1.63m/s as to improve gait speed for better community ambulation and to reduce fall risk. Baseline: 09/28/23: 0.1m/s without AD 11/09/2023: 0.89 m/s (11.50 seconds seconds 10.94 seconds) no AD, with close SBA for safety 01/04/2024: 0.935 m/s without AD, CGA/close SBA for safety 03/21/24: 0.8 m/s without AD, CGA/close SBA for safety Goal status: IN PROGRESS  4.  Patient will increase six minute walk test distance to >1080ft for progression to community ambulator and improve gait ability Baseline: 10/04/2023: 538ft (126m at 0.62m/s avg) 11/09/2023: 1061 feet (323 meters, Avg speed 0.897 m/s) without AD Goal status: MET and Advanced, ONGOING with updated goal 11/09/2023 Goal Updated: distance > 1357ft 01/13/2024: 832  ft mostly without SPC but then pt needed SPC due to fatigue, required one break (standing) 03/21/24: 750' with mostly SPC due to fatigue    5. Patient will increase MiniBest Test score to >18/28 to indicate a reduced risk for falling and demonstrate increased independence with functional mobility and ADLs.  Baseline: 10/04/2023: 8/28  11/16/2023: 16/28  01/22/2024: deferred 6/17: 20/28 03/21/24: 17/28  Goal status: Re-instated due to score below goal   6. Patient will  increase ABC scale score >80% to demonstrate better functional mobility and better confidence with ADLs.   Baseline: 09/28/23: 17.5% 11/09/2023: 21.25% - pt reports he rated the test as is if he was not using his walker because this is his long term goal  01/04/2024: 35.625% - continuing to rate it as if he is not using his walker 03/21/24: 23% - rates as if he is not using SPC   Goal status: IN PROGRESS   ASSESSMENT:  CLINICAL IMPRESSION: ***  Patient arrived highly motivated to participate in therapy session. Focus of therapy session today was on reciprocal movement patterns with larger amplitude and increased speed while also reinforcing maintaining upright posture throughout. Patient demonstrates significant improvement in his upright posture compared to last time seen by this therapist. Patient also continues to benefit from external targets to improve size of extremity movements. Pt will continue to benefit from skilled therapy to address remaining deficits in order to improve overall QoL and return to PLOF.      OBJECTIVE IMPAIRMENTS: Abnormal gait, decreased activity tolerance, decreased balance, decreased coordination, decreased endurance, decreased knowledge of condition, decreased knowledge of use of DME, decreased mobility, difficulty walking, decreased ROM, decreased strength, decreased safety awareness, improper body mechanics, postural dysfunction, and pain.   ACTIVITY LIMITATIONS: carrying, lifting, bending, standing, squatting, sleeping, stairs, transfers, bed mobility, continence, bathing, toileting, dressing, reach over head, hygiene/grooming, and locomotion level  PARTICIPATION LIMITATIONS: meal prep, cleaning, laundry, and community activity  PERSONAL FACTORS: Fitness and 3+ comorbidities: Vitamin D deficiency, Hyperlipidemia, and HTN are also affecting patient's functional outcome.   REHAB POTENTIAL: Good  CLINICAL DECISION MAKING: Evolving/moderate complexity  EVALUATION  COMPLEXITY: Moderate  PLAN:  PT FREQUENCY: 1-2x/week  PT DURATION: 12 weeks  PLANNED INTERVENTIONS: 97164- PT Re-evaluation, 97110-Therapeutic exercises, 97530- Therapeutic activity, 97112- Neuromuscular re-education, 97535- Self Care, 02859- Manual therapy, [phone]- Gait training, [phone]- Orthotic Fit/training, [phone]- Canalith repositioning, [phone]- Electrical stimulation (manual), Patient/Family education, Balance training, Stair training, Joint mobilization, Vestibular training, Visual/preceptual remediation/compensation, DME instructions,  Cryotherapy, and Moist heat  PLAN FOR NEXT SESSION: *** Follow-up on interest in joining [place] to allow him to use Octane device outside of therapy Continue postural strengthening and positioning.  Large amplitude movement. Safety with gait and at home AD.     Connell Kiss, PT, DPT, NCS, CSRS Physical Therapist - [place]  [facility]  8:41 AM 04/04/24

## 2024-04-06 ENCOUNTER — Ambulatory Visit

## 2024-04-11 ENCOUNTER — Ambulatory Visit: Attending: Family Medicine

## 2024-04-11 ENCOUNTER — Ambulatory Visit: Admitting: Physical Therapy

## 2024-04-11 DIAGNOSIS — R2681 Unsteadiness on feet: Secondary | ICD-10-CM | POA: Diagnosis present

## 2024-04-11 DIAGNOSIS — R531 Weakness: Secondary | ICD-10-CM | POA: Insufficient documentation

## 2024-04-11 DIAGNOSIS — R262 Difficulty in walking, not elsewhere classified: Secondary | ICD-10-CM | POA: Insufficient documentation

## 2024-04-11 DIAGNOSIS — R269 Unspecified abnormalities of gait and mobility: Secondary | ICD-10-CM | POA: Diagnosis present

## 2024-04-11 DIAGNOSIS — M6281 Muscle weakness (generalized): Secondary | ICD-10-CM | POA: Diagnosis present

## 2024-04-11 DIAGNOSIS — R278 Other lack of coordination: Secondary | ICD-10-CM | POA: Insufficient documentation

## 2024-04-11 NOTE — Therapy (Signed)
 OUTPATIENT PHYSICAL THERAPY TREATMENT  Patient Name: Daniel Daugherty MRN: [id number] DOB:05/21/1966, 58 y.o., male Today's Date: 04/11/2024  PCP: Odell Chard, Edra GRADE, MD  REFERRING PROVIDER: Odell Chard, Edra GRADE, MD   END OF SESSION:    PT End of Session - 04/11/24 1112     Visit Number 34    Number of Visits 48    Date for PT Re-Evaluation 05/23/24    Authorization Type Medicare 2025    Progress Note Due on Visit 34    PT Start Time 1105    PT Stop Time 1144    PT Time Calculation (min) 39 min    Equipment Utilized During Treatment Gait belt    Activity Tolerance Patient tolerated treatment well    Behavior During Therapy WFL for tasks assessed/performed             Past Medical History:  Diagnosis Date   Hypertension    Past Surgical History:  Procedure Laterality Date   DG HAND LEFT COMPLETE ([facility] HX) Left 04/13/1999   There are no active problems to display for this patient.  ONSET DATE: 2022 is when he noticed the change and that was around the same time he was diagnosed with Parkinson's  REFERRING DIAG:  G20.A1 (ICD-10-CM) - Parkinson disease (HCC)  Z74.1 (ICD-10-CM) - Requires daily assistance for activities of daily living (ADL) and comfort needs   THERAPY DIAG:  Generalized weakness  Difficulty in walking, not elsewhere classified  Unsteadiness on feet  Abnormality of gait  Muscle weakness (generalized)  Rationale for Evaluation and Treatment: Rehabilitation  SUBJECTIVE:                                                                                                                                                                                             SUBJECTIVE STATEMENT:    Pt reports some discomfort in his low back. Pt reports low back pain that startd when getting out of a car earlier this week. Reports it is limiting and exacerbated in standing. He rates it 9/10 but when asked if he needed immediate attention like ER he said it was  not that bad   PERTINENT HISTORY: Parkinsonism, Vitamin D deficiency, Hyperlipidemia, HTN, poor medication compliance (per chart review)  PAIN:  Are you having pain? Rt shoulder/neck 4-5/10.   PRECAUTIONS: Fall  RED FLAGS: None   WEIGHT BEARING RESTRICTIONS: No  FALLS: Has patient fallen in last 6 months? Yes. 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   PATIENT GOALS: Get my balance right with my walking, help get my R arm stronger and be able to move it more, improve strength in my legs (specifically the Right)  OBJECTIVE:  Note: Objective measures were completed at Evaluation unless otherwise noted.                                                                                                                             TREATMENT DATE: 04/11/24  Unless otherwise stated, CGA was provided and gait belt donned in order to ensure pt safety throughout session.  Pt ambulates into therapy clinic using cane with close SBA. Pt reports low back pain that startd when getting out of a car earlier this week. Reports it is limiting and exacerbated in standing so tried to minimize standing activities this date.  TA- To improve functional movements patterns for everyday tasks   B UE and B LE cyclical movements on Octane for priming activation of reciprocal movement patterns and cardiovascular training against the  following levels:  Level 1 resistance for 2 minutes Level 3 x 4 min   Self care: Pt reports less interest in well zone, reports he would like his own machine for home. PT instructed appropriate machines and encouraged pt to check with PT prior to any purchase.   TA- To improve functional movements patterns for everyday tasks   Sit<>stands from green chair with shoulder extension in standing to promote upright posture 2x 10reps - patient demonstrates improvement in upright posture compared to last time seen by this therapist!  TE- To improve strength, endurance, mobility, and function of specific targeted muscle groups or improve joint range of motion or improve muscle flexibility  Seated row 2 x 15 reps with GTB   Seated HABD - poor form, x 10 reps GTB   Seated shoulder flexion to end range with postural focus 2 x 10   Seated boxing alternating sides with cross body reach 2 x 20 reps ea      PATIENT EDUCATION: Education details: exercise technique, reassessment  Person educated: Patient Education method: Explanation, demo, vc Education comprehension: verbalized understanding and needs further education  HOME EXERCISE PROGRAM:  Access Code: [email or web address] URL: [email or web address] Date: 12/02/2023 Prepared by: Connell Kiss  Exercises - Sidelying Thoracic Rotation with Open Book  - 1 x daily - 7 x weekly - 2 sets - 10 reps - Supine Shoulder External Rotation in Abduction  - 1 x daily - 7 x weekly - 2 sets - 10 reps - 10 seconds hold - Seated Scapular  Retraction  - 1 x daily - 7 x weekly - 3 sets - 10 reps - 3 seconds hold - Sit to Stand with Arm Swing  - 1 x daily - 7 x weekly - 2 sets - 10 reps   GOALS: Goals reviewed with patient? Yes  SHORT TERM GOALS: Target date: 04/27/2024    Patient will be independent in home exercise program to improve strength/mobility for better functional independence with ADLs. Baseline: need to initiate 10/07/2023:  provided 12/02/2023: updated Goal status: IN PROGRESS   LONG TERM GOALS: Target date: 05/23/2024   1.  Patient (< 62 years old) will complete five times sit to stand test in < 12 seconds indicating an increased LE strength and improved balance. Baseline: 09/28/23: 38.53 seconds 11/09/2023: 22.13 seconds with arms across chest 01/04/2024: 20.12 seconds without UE support 03/21/24: 17.20 seconds without UE support  Goal status: IN PROGRESS   2. Patient will reduce timed up and go to <11 seconds to reduce fall risk and demonstrate improved transfer/gait ability. Baseline: 09/28/23: 19.46 seconds without AD 11/09/2023: 16.96 seconds, no AD, with close SBA for balance safety 01/04/2024: 15.845 seconds, no AD with close SBA for safety 03/21/24: 14.83 seconds, no AD with close SBA for safety  Goal status: IN PROGRESS  3.  Patient will increase 10 meter walk test to >1.8m/s as to improve gait speed for better community ambulation and to reduce fall risk. Baseline: 09/28/23: 0.68m/s without AD 11/09/2023: 0.89 m/s (11.50 seconds seconds 10.94 seconds) no AD, with close SBA for safety 01/04/2024: 0.935 m/s without AD, CGA/close SBA for safety 03/21/24: 0.8 m/s without AD, CGA/close SBA for safety Goal status: IN PROGRESS  4.  Patient will increase six minute walk test distance to >1055ft for progression to community ambulator and improve gait ability Baseline: 10/04/2023: 563ft (192m at 0.74m/s avg) 11/09/2023: 1061 feet (323 meters, Avg speed 0.897 m/s) without AD Goal status: MET and Advanced, ONGOING with updated goal 11/09/2023 Goal Updated: distance > 137ft 01/13/2024: 832  ft mostly without SPC but then pt needed SPC due to fatigue, required one break (standing) 03/21/24: 750' with mostly SPC due to fatigue    5. Patient will increase MiniBest Test score to >18/28 to indicate a reduced risk for falling and demonstrate increased independence with functional mobility and ADLs.  Baseline: 10/04/2023:  8/28  11/16/2023: 16/28  01/22/2024: deferred 6/17: 20/28 03/21/24: 17/28  Goal status: Re-instated due to score below goal   6. Patient will increase ABC scale score >80% to demonstrate better functional mobility and better confidence with ADLs.   Baseline: 09/28/23: 17.5% 11/09/2023: 21.25% - pt reports he rated the test as is if he was not using his walker because this is his long term goal  01/04/2024: 35.625% - continuing to rate it as if he is not using his walker 03/21/24: 23% - rates as if he is not using SPC   Goal status: IN PROGRESS   ASSESSMENT:  CLINICAL IMPRESSION:   Patient presents with good motivation for completion of physical therapy activities.  Physical therapy session limited this date secondary to patient having increased pain in low back from getting out of a car earlier this week.  Completed most activities and seated with focus on postural strengthening as well as some focus on lower extremity strength.  Instructed patient if his pain does not improve he should seek further medical advice.Pt will continue to benefit from skilled physical therapy intervention to address impairments, improve QOL, and attain therapy  goals.      OBJECTIVE IMPAIRMENTS: Abnormal gait, decreased activity tolerance, decreased balance, decreased coordination, decreased endurance, decreased knowledge of condition, decreased knowledge of use of DME, decreased mobility, difficulty walking, decreased ROM, decreased strength, decreased safety awareness, improper body mechanics, postural dysfunction, and pain.   ACTIVITY LIMITATIONS: carrying, lifting, bending, standing, squatting, sleeping, stairs, transfers, bed mobility, continence, bathing, toileting, dressing, reach over head, hygiene/grooming, and locomotion level  PARTICIPATION LIMITATIONS: meal prep, cleaning, laundry, and community activity  PERSONAL FACTORS: Fitness and 3+ comorbidities: Vitamin D deficiency, Hyperlipidemia, and HTN are also  affecting patient's functional outcome.   REHAB POTENTIAL: Good  CLINICAL DECISION MAKING: Evolving/moderate complexity  EVALUATION COMPLEXITY: Moderate  PLAN:  PT FREQUENCY: 1-2x/week  PT DURATION: 12 weeks  PLANNED INTERVENTIONS: 97164- PT Re-evaluation, 97110-Therapeutic exercises, 97530- Therapeutic activity, 97112- Neuromuscular re-education, 97535- Self Care, 02859- Manual therapy, [phone]- Gait training, [phone]- Orthotic Fit/training, [phone]- Canalith repositioning, [phone]- Electrical stimulation (manual), Patient/Family education, Balance training, Stair training, Joint mobilization, Vestibular training, Visual/preceptual remediation/compensation, DME instructions, Cryotherapy, and Moist heat  PLAN FOR NEXT SESSION:  Follow-up on interest in joining [place] to allow him to use Octane device outside of therapy Continue postural strengthening and positioning.  Large amplitude movement. Safety with gait and at home AD.     Connell Kiss, PT, DPT, NCS, CSRS Physical Therapist - [place]  [facility]  12:16 PM 04/11/24

## 2024-04-13 ENCOUNTER — Ambulatory Visit

## 2024-04-13 DIAGNOSIS — M6281 Muscle weakness (generalized): Secondary | ICD-10-CM

## 2024-04-13 DIAGNOSIS — R278 Other lack of coordination: Secondary | ICD-10-CM

## 2024-04-13 DIAGNOSIS — R269 Unspecified abnormalities of gait and mobility: Secondary | ICD-10-CM

## 2024-04-13 DIAGNOSIS — R2681 Unsteadiness on feet: Secondary | ICD-10-CM

## 2024-04-13 DIAGNOSIS — R262 Difficulty in walking, not elsewhere classified: Secondary | ICD-10-CM

## 2024-04-13 DIAGNOSIS — R531 Weakness: Secondary | ICD-10-CM

## 2024-04-13 NOTE — Therapy (Signed)
 OUTPATIENT PHYSICAL THERAPY TREATMENT  Patient Name: Daniel Daugherty MRN: [id number] DOB:[birth year], 58 y.o., male Today's Date: 04/13/2024  PCP: Odell Chard, Edra GRADE, MD  REFERRING PROVIDER: Odell Chard, Edra GRADE, MD   END OF SESSION:    PT End of Session - 04/13/24 1056     Visit Number 35    Number of Visits 48    Date for PT Re-Evaluation 05/23/24    Authorization Type Medicare 2025    Progress Note Due on Visit 34    PT Start Time 1102    PT Stop Time 1145    PT Time Calculation (min) 43 min    Equipment Utilized During Treatment Gait belt    Activity Tolerance Patient tolerated treatment well    Behavior During Therapy WFL for tasks assessed/performed             Past Medical History:  Diagnosis Date   Hypertension    Past Surgical History:  Procedure Laterality Date   DG HAND LEFT COMPLETE ([facility] HX) Left 04/13/1999   There are no active problems to display for this patient.  ONSET DATE: 2022 is when he noticed the change and that was around the same time he was diagnosed with Parkinson's  REFERRING DIAG:  G20.A1 (ICD-10-CM) - Parkinson disease (HCC)  Z74.1 (ICD-10-CM) - Requires daily assistance for activities of daily living (ADL) and comfort needs   THERAPY DIAG:  Generalized weakness  Difficulty in walking, not elsewhere classified  Unsteadiness on feet  Abnormality of gait  Muscle weakness (generalized)  Other lack of coordination  Rationale for Evaluation and Treatment: Rehabilitation  SUBJECTIVE:                                                                                                                                                                                             SUBJECTIVE STATEMENT:   Pt reports his LBP is improved. Currently 4/10 NPS. No falls or updates otherwise.   PERTINENT HISTORY: Parkinsonism, Vitamin D deficiency, Hyperlipidemia, HTN, poor medication compliance (per chart review)  PAIN:  Are you having  pain? Rt shoulder/neck 4-5/10.   PRECAUTIONS: Fall  RED FLAGS: None   WEIGHT BEARING RESTRICTIONS: No  FALLS: Has patient fallen in last 6 months? Yes. 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   PATIENT GOALS: Get my balance right with my walking, help get my R arm stronger and be able to move it more, improve strength in my legs (specifically the Right)  OBJECTIVE:  Note: Objective measures were completed at Evaluation unless otherwise noted.                                                                                                                             TREATMENT DATE: 04/13/24  Unless otherwise stated, CGA was provided and gait belt donned in order to ensure pt safety throughout session.  Pt ambulates into therapy clinic using cane with close SBA.    TA- To improve functional movements patterns for everyday tasks   B UE and B LE cyclical movements on Octane for priming activation of reciprocal movement patterns and cardiovascular training against the following levels:  Level 4 x 5 min   STS standard height chair with overhead press with basketball for large amplitude movements: 2x10 CGA. VC's for large amplitude motion   Neuro Re-Ed:  Ambulation 300' alternating boxing punches across body with gloves, CGA. Intermittent VC's for sequencing contralateral UE/LE correctly with fair carryover.    Obstacle course with 2# AW's donned, CGA: x2 hurdle step overs --> airex pad step up and over --> 1/2 bolster step over --> alternating cone taps (x4 cones). 5 laps. Step through gait diminished with AW's.   Gait no AD 300' working on reciprocal gait and arm swing. VC's for foot clearance.      PATIENT EDUCATION: Education details: exercise technique, reassessment  Person educated: Patient Education method: Explanation, demo, vc Education comprehension: verbalized understanding and needs further education  HOME EXERCISE PROGRAM:  Access Code: [email or web address] URL: [email or web address] Date: 12/02/2023 Prepared by: Connell Kiss  Exercises - Sidelying Thoracic Rotation with Open Book  - 1 x daily - 7 x weekly - 2 sets - 10 reps - Supine Shoulder External Rotation in Abduction  - 1 x daily - 7 x weekly - 2 sets - 10 reps - 10 seconds hold - Seated Scapular Retraction  - 1 x daily - 7 x weekly - 3 sets - 10 reps - 3 seconds hold - Sit to Stand with Arm Swing  - 1 x daily - 7 x weekly - 2 sets - 10 reps   GOALS: Goals reviewed with patient? Yes  SHORT TERM GOALS: Target date: 04/27/2024    Patient will be independent in home exercise program to improve strength/mobility for better functional independence with ADLs. Baseline: need to initiate 10/07/2023: provided 12/02/2023: updated Goal status: IN PROGRESS   LONG TERM GOALS: Target date: 05/23/2024   1.  Patient (< 12 years old) will complete five times sit to stand test in < 12 seconds indicating  an increased LE strength and improved balance. Baseline: 09/28/23: 38.53 seconds 11/09/2023: 22.13 seconds with arms across chest 01/04/2024: 20.12 seconds without UE support 03/21/24: 17.20 seconds without UE support  Goal status: IN PROGRESS   2. Patient will reduce timed up and go to <11 seconds to reduce fall risk and demonstrate improved transfer/gait ability. Baseline: 09/28/23: 19.46 seconds without AD 11/09/2023:  16.96 seconds, no AD, with close SBA for balance safety 01/04/2024: 15.845 seconds, no AD with close SBA for safety 03/21/24: 14.83 seconds, no AD with close SBA for safety  Goal status: IN PROGRESS  3.  Patient will increase 10 meter walk test to >1.48m/s as to improve gait speed for better community ambulation and to reduce fall risk. Baseline: 09/28/23: 0.52m/s without AD 11/09/2023: 0.89 m/s (11.50 seconds seconds 10.94 seconds) no AD, with close SBA for safety 01/04/2024: 0.935 m/s without AD, CGA/close SBA for safety 03/21/24: 0.8 m/s without AD, CGA/close SBA for safety Goal status: IN PROGRESS  4.  Patient will increase six minute walk test distance to >1074ft for progression to community ambulator and improve gait ability Baseline: 10/04/2023: 526ft (117m at 0.75m/s avg) 11/09/2023: 1061 feet (323 meters, Avg speed 0.897 m/s) without AD Goal status: MET and Advanced, ONGOING with updated goal 11/09/2023 Goal Updated: distance > 1356ft 01/13/2024: 832  ft mostly without SPC but then pt needed SPC due to fatigue, required one break (standing) 03/21/24: 750' with mostly SPC due to fatigue    5. Patient will increase MiniBest Test score to >18/28 to indicate a reduced risk for falling and demonstrate increased independence with functional mobility and ADLs.  Baseline: 10/04/2023: 8/28  11/16/2023: 16/28  01/22/2024: deferred 6/17: 20/28 03/21/24: 17/28  Goal status: Re-instated due to score below goal   6. Patient will increase ABC scale score >80% to demonstrate better functional mobility and better confidence with ADLs.   Baseline: 09/28/23: 17.5% 11/09/2023: 21.25% - pt reports he rated the test as is if he was not using his walker because this is his long term goal  01/04/2024: 35.625% - continuing to rate it as if he is not using his walker 03/21/24: 23% - rates as if he is not using SPC   Goal status: IN PROGRESS   ASSESSMENT:  CLINICAL IMPRESSION:  Continuing PT POC working on gait, large  amplitude movements and LE strengthening. Pt able to maintain normal POC as his LBP is improved per subjective reporting. Pt with good consistent step through gait but maintains limited arm swing and with AW's with reduced foot clearance. Utilization of boxing tasks across body to work on reciprocal gait pattern but is reliant on multiple stopping breaks and max VC's to reset gait due to poor reciprocal capabilities. Pt maintains motivated throughout session.  Pt will continue to benefit from skilled physical therapy intervention to address impairments, improve QOL, and attain therapy goals.    OBJECTIVE IMPAIRMENTS: Abnormal gait, decreased activity tolerance, decreased balance, decreased coordination, decreased endurance, decreased knowledge of condition, decreased knowledge of use of DME, decreased mobility, difficulty walking, decreased ROM, decreased strength, decreased safety awareness, improper body mechanics, postural dysfunction, and pain.   ACTIVITY LIMITATIONS: carrying, lifting, bending, standing, squatting, sleeping, stairs, transfers, bed mobility, continence, bathing, toileting, dressing, reach over head, hygiene/grooming, and locomotion level  PARTICIPATION LIMITATIONS: meal prep, cleaning, laundry, and community activity  PERSONAL FACTORS: Fitness and 3+ comorbidities: Vitamin D deficiency, Hyperlipidemia, and HTN are also affecting patient's functional outcome.   REHAB POTENTIAL: Good  CLINICAL DECISION MAKING: Evolving/moderate  complexity  EVALUATION COMPLEXITY: Moderate  PLAN:  PT FREQUENCY: 1-2x/week  PT DURATION: 12 weeks  PLANNED INTERVENTIONS: 97164- PT Re-evaluation, 97110-Therapeutic exercises, 97530- Therapeutic activity, 97112- Neuromuscular re-education, 97535- Self Care, [id number]- Manual therapy, [phone]- Gait training, [phone]- Orthotic Fit/training, [phone]- Canalith repositioning, [phone]- Electrical stimulation (manual), Patient/Family education, Balance training, Stair  training, Joint mobilization, Vestibular training, Visual/preceptual remediation/compensation, DME instructions, Cryotherapy, and Moist heat  PLAN FOR NEXT SESSION:  Follow-up on interest in joining [facility] to allow him to use Octane device outside of therapy Continue postural strengthening and positioning.  Large amplitude movement. Safety with gait and at home AD.    Dorina HERO. Fairly IV, PT, DPT Physical Therapist- [place]  [facility] 12:14 PM 04/13/24

## 2024-04-14 NOTE — Therapy (Signed)
 Occupational Therapy Neuro Treatment Note   Patient Name: Daniel Daugherty MRN: [id number] DOB:[birth year], 58 y.o., male Today's Date: 04/14/2024  PCP: N/A REFERRING PROVIDER: Odell Tor Edra CINDERELLA, MD  END OF SESSION:  OT End of Session - 04/14/24 1513     Visit Number [id number]    Number of Visits 48    Date for OT Re-Evaluation 06/15/24    OT Start Time 1145    OT Stop Time 1230    OT Time Calculation (min) 45 min    Activity Tolerance Patient tolerated treatment well    Behavior During Therapy WFL for tasks assessed/performed            Past Medical History:  Diagnosis Date   Hypertension    Past Surgical History:  Procedure Laterality Date   DG HAND LEFT COMPLETE ([facility] HX) Left 04/13/1999   There are no active problems to display for this patient.  ONSET DATE: 05/2021  REFERRING DIAG: Parkinson's Disease  THERAPY DIAG:  Muscle weakness (generalized)  Other lack of coordination  Rationale for Evaluation and Treatment: Rehabilitation  SUBJECTIVE:  SUBJECTIVE STATEMENT: Pt reports wanting to focus on repositioning items in his hands, Like when I pick up my phone.  Pt accompanied by: self  PERTINENT HISTORY:   Pt. is a 58 y.o. male who was diagnosed with Parkinson's Disease in 2022. Pt. reports having had a progressive decline in function since having hernia surgery, and right shoulder rotator cuff surgery. PMHx included: HTN. Of note; Pt. was being followed by [facility], and receiving Home health therapy services. Pt. changed physicians, and was referred for outpatient rehab services here.  PRECAUTIONS: None  WEIGHT BEARING RESTRICTIONS: No  PAIN:  Are you having pain? 0/10 pain  FALLS: Has patient fallen in last 6 months? No, 1 small slip near the bed tripped over shoes  LIVING ENVIRONMENT: Lives with: lives alone Lives in: [place],  Stairs: Entry level Has following equipment at home: Quad cane small base, rollator, shower chair, grab bars, hand rail at  [employer]  PLOF: Independent  PATIENT GOALS: Improve self-care  OBJECTIVE:  Note: Objective measures were completed at Evaluation unless otherwise noted.  HAND DOMINANCE: Right  ADLs:  Assist at home:  Ex wife, and children assist several times a week  Transfers/ambulation related to ADLs: Modified Independent Eating: Difficulty using dominant right hand. Drops food when scooping, Both hands for stabilizing drinks, and uses a straw. Unable to cut food.  Grooming: Increased time required for all grooming tasks. UB Dressing: Difficulty managing buttons LB Dressing: Less assist with pants with elastic, more assist with dress pants Toileting: Increased time for hygiene. Bathing: Requires increased time to complete Tub Shower transfers: Modified Independence with increased time  IADLs: Shopping: Son assists with grocery shopping Light housekeeping: Son's assist. Pt. Does light rising off of dishes. Meal Prep: Increased time. Independent with microwave, and air fryer. Difficulty opening bottles, containers Community mobility:  Transportation, light driving to grocery store Medication management:  Son's set-up weekly pillbox, Pt. Is responsible for taking the medications Financial management: Son's assist with monthly finances Handwriting: 50% legible Hobbies: Dancing, watching sports Career/work: Restaurant manager, fast food  MOBILITY STATUS: Uses a cane; shuffling gait  FUNCTIONAL OUTCOME MEASURES:  Mam-20 sum score: 53/80, MAM Measure score: 52.4   UPPER EXTREMITY ROM:    Active ROM Right eval Right 11/09/23 Right  01/04/24 Right 03/23/24 Left Eval [facility] overall  Shoulder flexion WFL [phone])   Shoulder abduction 52(100) 60(110) 31(881) 72(110)  Shoulder adduction       Shoulder extension       Shoulder internal rotation       Shoulder external rotation       Elbow flexion [facility] WFL   Elbow extension [facility] WFL   Wrist flexion       Wrist  extension [facility] WFL   Wrist ulnar deviation       Wrist radial deviation       Wrist pronation       Wrist supination       (Blank rows = not tested)  UPPER EXTREMITY MMT:     MMT Right eval Right 11/09/23 Right 01/04/24 Right 03/23/24 Left Eval 4-/5 overall Left 11/09/23 4/5 Overall  Shoulder flexion 3/5 3-/5 3-/5 3-/5    Shoulder abduction 2/5 2/5 2+/5 3-/5    Shoulder adduction        Shoulder extension        Shoulder internal rotation        Shoulder external rotation        Middle trapezius        Lower trapezius        Elbow flexion 3/5 3+/5 3+/5 4-/5    Elbow extension 3/5 3+5 4-/5 4-/5    Wrist flexion        Wrist extension 3/5 3/5 3/5 3+/5    Wrist ulnar deviation        Wrist radial deviation        Wrist pronation        Wrist supination        (Blank rows = not tested)  HAND FUNCTION:   Eval: Grip strength: Right: 50 lbs; Left: 34 lbs, Lateral pinch: Right: 17 lbs, Left: 9 lbs, 3 point pinch: Right: 12 lbs, Left: 11 lbs  11/09/23: Grip strength: Right: 60 lbs; Left: 35 lbs, Lateral pinch: Right: 19 lbs, Left: 11 lbs, 3 point pinch: Right: 13 lbs, Left: 9 lbs  01/04/24:Grip strength: Right: 40 lbs; Left: 41 lbs, Lateral pinch: Right: 17 lbs, Left: 8 lbs, 3 point pinch: Right: 9 lbs, Left: 10 lbs  03/23/24:Grip strength: Right: 74 lbs; Left: 55 lbs, Lateral pinch: Right: 15 lbs, Left: 10 lbs, 3 point pinch: Right: 9 lbs, Left: 11 lbs   COORDINATION:  Eval: 9 Hole Peg test: Right: 1 min. & 46 sec; Left: 53 sec  11/09/2023: 9 Hole Peg test: Right: 1 min. & 22 sec; Left: 46 sec  01/04/24: 9 Hole Peg test: Right: 1 min. & 11 sec; Left: 46 sec  03/23/24: 9 Hole Peg test: Right: 1 min.; Left: 46 sec   SENSATION:  Light touch: WFL Proprioception: WFL  EDEMA: N/A  COGNITION: Overall cognitive status: Within functional limits for tasks assessed  VISION:  Does not wear glasses.  VISION ASSESSMENT: To be further assessed in functional  context  PERCEPTION: WFL  PRAXIS: Impaired: Motor planning  TREATMENT DATE: 04/11/24 Therapeutic Exercise: -Standing wall stretch for thoracic ext, OT providing passive shoulder retraction stretch -In sitting, performed PROM to R/LUEs for all shoulder planes, working to decrease stiffness/increase end range for ADL performance.  Self Care: -Fork/knife skill practice cutting theraputty; performed hand to mouth patterns after stabbing putty pieces with a fork in R hand; min vc/tactile cues for fork positioning in R hand -Issued red built up foam grips for fork/knife at home  Therapeutic Activity: -Facilitated R/L hand manipulation skills, working to pick up and reposition small objects in each hand, including cell phone, pen, fork.  Pt cued to attempt to reposition items with the hand holding the object, but mostly used the opposite hand to reposition as needed.  OT repositioned items on table top to challenge reaching ipsilaterally and contralaterally, requiring pt to rotate/flip/turn the objects to reposition items for functional use in either hand.  PATIENT EDUCATION: Education details: fork/knife skills Person educated: Patient Education method: Programmer, multimedia, Demonstration, and Verbal cues Education comprehension: needs further education  HOME EXERCISE PROGRAM: Writing tasks, cane stretches in recliner for UB flexibility  GOALS: Goals reviewed with patient? Yes  SHORT TERM GOALS: Target date: 05/04/2024   Pt. Will be independent with HEPs for RUE. Baseline: 03/23/24: Independent, ongoing 01/04/24: Independent 11/09/2023: Independent Eval: No current HEP Goal status:Ongoing  LONG TERM GOALS: Target date: 06/15/2024  1.  Pt. Will increase right shoulder abduction by 10 degrees to  be able to reach up for hair care/grooming. Baseline: 03/23/24: 72(110) Pt. is  independent with donning his shirt. However, requires increased time. 03/21/24: Pt. requires increased time to donn his shirt. 01/04/24: 31(881) Pt. reports having donning his shirt is easier with his new routine 11/09/23: Right 60/(110) Eval: Right 52(100) Goal status:  Achieved, revised  (03/23/24) to be able to perform hair care.  2.  Pt. Will increase BUE strength by 2 mm grades to assist with ADLs. Baseline:03/23/24: Right: shoulder flexion: 3-/5, abduction: 3-/5, elbow flexion 4-/5, extension: 4-/5, wrist extension: 3+/5. Eval: Right: shoulder flexion: 3/5, abduction: 2/5, elbow flexion/extension: 3/5, wrist extension: 3/5. Left: 4-/5 overall  03/21/24: Right: shoulder flexion: 3-/5, abduction: 3-/5, elbow flexion 4-/5, extension: 4-/5, wrist extension: 3/5. Left: 4+/5 overall Eval: Right: shoulder flexion: 3/5, abduction: 2/5, elbow flexion/extension: 3/5, wrist extension: 3/5. Left: 4-/5 overall  01/04/24:  Right: shoulder flexion: 3-/5, abduction: 2/5, elbow flexion 4-/5, extension: 3+/5, wrist extension: 3/5. Left: 4/5 overallEval: Right: shoulder flexion: 3/5, abduction: 2/5, elbow flexion/extension: 3/5, wrist extension: 3/5. Left: 4-/5 overall4/08/25: Right: shoulder flexion: 3-/5, abduction: 2/5, elbow flexion/extension: 3+/5, wrist extension: 3/5. Left: 4/5 overallEval: Right: shoulder flexion: 3/5, abduction: 2/5, elbow flexion/extension: 3/5, wrist extension: 3/5. Left: 4-/5 overall Goal status: Ongoing  3.  Pt. Will perform self-feeding with modified independence Baseline: 03/23/2024: Pt. Requires set-up, and assist to perform scooping action, and to perform hand to mouth patterns. Pt. could benefit from an adaptive utensil.03/21/24: Pt. Requires is improving with hand to mouth patterns,however continues to have difficulty scooping his food, and consistency with hand to mouth patterns. 01/04/24: Pt. reports making improvements with self-feeding. Pt. has difficulty at times with bringing his spoon to  his mouth, often bringing an empty spoon to his mouth 11/09/23: Pt. Continues to have difficulty using dominant right hand. Drops food when scooping, Both hands for stabilizing drinks, and uses a straw. Unable to cut food. Eval: Difficulty using dominant right hand. Drops food when scooping, Both hands for stabilizing drinks, and uses a straw. Unable to cut food.  Goal status: Ongoing  4.  Pt. Will demonstrate independence with proper A/E techniques/compensatory strategies for ADL/IADLs.  Baseline: 03/23/2024:  Continue 03/21/24: Continue 01/04/24: Continue ongoing as needed. 11/09/23: Continue Eval: Education to be provided Goal status: Ongoing  5.  Pt. Will write a sentence with 75% legibility with modified independence Baseline: 03/23/24: 1 min. & 52 sec. to complete one sentence with 60% legibility. Pt. Presents with micrographia towards the end of the sentence. 03/21/24: Continue 01/04/24:  One sentence completed in 2 min. & 51 sec. With 50% legibility.11/09/2023: 50% legibility for one sentence. Eval: 50% legibility for one printed sentence Goal status: Ongoing   6.  Pt. Will improve bilateral [facility] skills by 3 sec. on the 9 Hole Peg Test to be able to manipulate small objects.  Baseline: 03/23/24: 9 Hole Peg test: Right: 1 min.; Left: 46 sec. 03/21/24: Pt. Has difficulty efficiently manipulating small objects. Pt. 9 Hole Peg Test: R: 1 min. & 11 sec.  L: 46 sec. 11/09/2023: 9 Hole Peg test: Right: 1 min. & 22 sec; Left: 46 sec Eval: 9 Hole Peg test: Right: 1 min. & 46 sec; Left: 53 sec Goal status: Ongoing  7. Pt. Will improve the Mam-20 score by 5 points to reflect ADL/IADL improvement. Baseline:03/23/2024: TBD; Eval: Mam-20 sum score: 53/80, Mam Measure score: 52.4 Goal status  Ongoing  8. Pt. will improve hand function skills to be able independently, and efficiently pick up, and place items (tv remotes/utensils/pens) into the proper position without using assistance from the left hand.  Baseline:  03/23/2024: Pt. Requires  assist from the left hand to position items within his right hand in preparation for using the item.    Goal status: New   ASSESSMENT:  CLINICAL IMPRESSION: Pt with good tolerance to passive stretching this date with seated and standing bilat shoulder stretches.  Pt found benefit from built up red foam grips to ease manipulation of fork/knife; 2 foam pieces issued for pt to apply to his utensils at home.  Pt was able to cut putty with table knife, and was encouraged to practice cutting at home with foam handles on his own utensils, as he states that he often just uses his hands to pull food apart d/t difficulty holding a knife, and fork often slipping from grip.  Practiced repositioning items in R/L hands today, with pt requiring the opposite hand to reposition cell phone, pen, and fork.  Pt. continues to benefit from OT services to work on improving overall bilateral UE functioning in order to maximize engagement in, and efficiency in ADLs, and IADL tasks, and provide education about compensatory strategies.    PERFORMANCE DEFICITS: in functional skills including ADLs, IADLs, coordination, dexterity, proprioception, sensation, ROM, strength, pain, Fine motor control, Gross motor control, and UE functional use, cognitive skills including , and psychosocial skills including coping strategies, environmental adaptation, and routines and behaviors.   IMPAIRMENTS: are limiting patient from ADLs, IADLs, and leisure.   CO-MORBIDITIES: may have co-morbidities  that affects occupational performance. Patient will benefit from skilled OT to address above impairments and improve overall function.  MODIFICATION OR ASSISTANCE TO COMPLETE EVALUATION: Min-Moderate modification of tasks or assist with assess necessary to complete an evaluation.  OT OCCUPATIONAL PROFILE AND HISTORY: Detailed assessment: Review of records and additional review of physical, cognitive, psychosocial history  related to current functional performance.  CLINICAL DECISION MAKING: Moderate - several treatment options, min-mod task modification necessary  REHAB POTENTIAL: Good  EVALUATION COMPLEXITY: Moderate  PLAN:  OT  FREQUENCY: 2x/week  OT DURATION: 12 weeks  PLANNED INTERVENTIONS: 97168 OT Re-evaluation, 97535 self care/ADL training, 02889 therapeutic exercise, 97530 therapeutic activity, 97112 neuromuscular re-education, 97140 manual therapy, 97018 paraffin, 02989 moist heat, 97034 contrast bath, 97760 Orthotics management and training, 02239 Splinting (initial encounter), energy conservation, patient/family education, and DME and/or AE instructions  RECOMMENDED OTHER SERVICES:   PT  CONSULTED AND AGREED WITH PLAN OF CARE: Patient  PLAN FOR NEXT SESSION:   Treatment  Inocente Blazing, MS, OTR/L  04/14/2024, 3:15 PM

## 2024-04-14 NOTE — Therapy (Signed)
 Occupational Therapy Neuro Treatment Note   Patient Name: Daniel Daugherty MRN: [id number] DOB:1965/11/27, 58 y.o., male Today's Date: 04/14/2024  PCP: N/A REFERRING PROVIDER: Odell Tor Daugherty CINDERELLA, MD  END OF SESSION:  OT End of Session - 04/14/24 1759     Visit Number 35    Number of Visits 48    Date for OT Re-Evaluation 06/15/24    OT Start Time 1145    OT Stop Time 1230    OT Time Calculation (min) 45 min    Activity Tolerance Patient tolerated treatment well    Behavior During Therapy WFL for tasks assessed/performed            Past Medical History:  Diagnosis Date   Hypertension    Past Surgical History:  Procedure Laterality Date   DG HAND LEFT COMPLETE ([facility] HX) Left 04/13/1999   There are no active problems to display for this patient.  ONSET DATE: 05/2021  REFERRING DIAG: Parkinson's Disease  THERAPY DIAG:  Muscle weakness (generalized)  Other lack of coordination  Rationale for Evaluation and Treatment: Rehabilitation  SUBJECTIVE:  SUBJECTIVE STATEMENT: Pt reports wanting to focus on handwriting today.  Pt accompanied by: self  PERTINENT HISTORY:  Pt. is a 57 y.o. male who was diagnosed with Parkinson's Disease in 2022. Pt. reports having had a progressive decline in function since having hernia surgery, and right shoulder rotator cuff surgery. PMHx included: HTN. Of note; Pt. was being followed by [facility], and receiving Home health therapy services. Pt. changed physicians, and was referred for outpatient rehab services here.  PRECAUTIONS: None  WEIGHT BEARING RESTRICTIONS: No  PAIN:  Are you having pain? 0/10 pain  FALLS: Has patient fallen in last 6 months? No, 1 small slip near the bed tripped over shoes  LIVING ENVIRONMENT: Lives with: lives alone Lives in: [place],  Stairs: Entry level Has following equipment at home: Quad cane small base, rollator, shower chair, grab bars, hand rail at [employer]  PLOF: Independent  PATIENT GOALS:  Improve self-care  OBJECTIVE:  Note: Objective measures were completed at Evaluation unless otherwise noted.  HAND DOMINANCE: Right  ADLs:  Assist at home:  Ex wife, and children assist several times a week  Transfers/ambulation related to ADLs: Modified Independent Eating: Difficulty using dominant right hand. Drops food when scooping, Both hands for stabilizing drinks, and uses a straw. Unable to cut food.  Grooming: Increased time required for all grooming tasks. UB Dressing: Difficulty managing buttons LB Dressing: Less assist with pants with elastic, more assist with dress pants Toileting: Increased time for hygiene. Bathing: Requires increased time to complete Tub Shower transfers: Modified Independence with increased time  IADLs: Shopping: Son assists with grocery shopping Light housekeeping: Son's assist. Pt. Does light rising off of dishes. Meal Prep: Increased time. Independent with microwave, and air fryer. Difficulty opening bottles, containers Community mobility:  Transportation, light driving to grocery store Medication management:  Son's set-up weekly pillbox, Pt. Is responsible for taking the medications Financial management: Son's assist with monthly finances Handwriting: 50% legible Hobbies: Dancing, watching sports Career/work: Restaurant manager, fast food  MOBILITY STATUS: Uses a cane; shuffling gait  FUNCTIONAL OUTCOME MEASURES: Mam-20 sum score: 53/80, MAM Measure score: 52.4  UPPER EXTREMITY ROM:    Active ROM Right eval Right 11/09/23 Right  01/04/24 Right 03/23/24 Left Eval [facility] overall  Shoulder flexion WFL [phone]) [phone](862)   Shoulder abduction 52(100) 60(110) 31(881) 72(110)   Shoulder adduction       Shoulder extension  Shoulder internal rotation       Shoulder external rotation       Elbow flexion [facility] WFL   Elbow extension [facility] WFL   Wrist flexion       Wrist extension [facility] WFL   Wrist ulnar deviation        Wrist radial deviation       Wrist pronation       Wrist supination       (Blank rows = not tested)  UPPER EXTREMITY MMT:     MMT Right eval Right 11/09/23 Right 01/04/24 Right 03/23/24 Left Eval 4-/5 overall Left 11/09/23 4/5 Overall  Shoulder flexion 3/5 3-/5 3-/5 3-/5    Shoulder abduction 2/5 2/5 2+/5 3-/5    Shoulder adduction        Shoulder extension        Shoulder internal rotation        Shoulder external rotation        Middle trapezius        Lower trapezius        Elbow flexion 3/5 3+/5 3+/5 4-/5    Elbow extension 3/5 3+5 4-/5 4-/5    Wrist flexion        Wrist extension 3/5 3/5 3/5 3+/5    Wrist ulnar deviation        Wrist radial deviation        Wrist pronation        Wrist supination        (Blank rows = not tested)  HAND FUNCTION:   Eval: Grip strength: Right: 50 lbs; Left: 34 lbs, Lateral pinch: Right: 17 lbs, Left: 9 lbs, 3 point pinch: Right: 12 lbs, Left: 11 lbs  11/09/23: Grip strength: Right: 60 lbs; Left: 35 lbs, Lateral pinch: Right: 19 lbs, Left: 11 lbs, 3 point pinch: Right: 13 lbs, Left: 9 lbs  01/04/24:Grip strength: Right: 40 lbs; Left: 41 lbs, Lateral pinch: Right: 17 lbs, Left: 8 lbs, 3 point pinch: Right: 9 lbs, Left: 10 lbs  03/23/24:Grip strength: Right: 74 lbs; Left: 55 lbs, Lateral pinch: Right: 15 lbs, Left: 10 lbs, 3 point pinch: Right: 9 lbs, Left: 11 lbs   COORDINATION:  Eval: 9 Hole Peg test: Right: 1 min. & 46 sec; Left: 53 sec  11/09/2023: 9 Hole Peg test: Right: 1 min. & 22 sec; Left: 46 sec  01/04/24: 9 Hole Peg test: Right: 1 min. & 11 sec; Left: 46 sec  03/23/24: 9 Hole Peg test: Right: 1 min.; Left: 46 sec   SENSATION:  Light touch: WFL Proprioception: WFL  EDEMA: N/A  COGNITION: Overall cognitive status: Within functional limits for tasks assessed  VISION:  Does not wear glasses.  VISION ASSESSMENT: To be further assessed in functional context  PERCEPTION: WFL  PRAXIS: Impaired: Motor planning  TREATMENT DATE: 04/13/24 Therapeutic Exercise: -Standing wall stretch for thoracic ext, OT providing passive shoulder retraction stretch -In sitting, performed PROM to R/LUEs for all shoulder planes, working to decrease stiffness/increase end range for ADL performance.  Self Care: -Focus on handwriting strategies to increase legibility and reduce micrographia  -Trial of pen weights and various pen grips -Use of lined paper for visual feedback to maintain letter height; vc for increasing letter and word spacing while writing printed first/last name/date, signature -Vc/tactile cues for posture/positioning to maximize distal support of BUEs on table top  -Placed dycem beneath paper to provide increased proprioceptive input with pen strokes -Reinforced benefits of daily writing practice on lined paper to minimize micrographia and carryover strategies reviewed above -Issued rubber pen grip and advised on options to obtain weighted pen/pen weight  PATIENT EDUCATION: Education details: fork/knife skills Person educated: Patient Education method: Programmer, multimedia, Demonstration, and Verbal cues Education comprehension: needs further education  HOME EXERCISE PROGRAM: Writing tasks, cane stretches in recliner for UB flexibility  GOALS: Goals reviewed with patient? Yes  SHORT TERM GOALS: Target date: 05/04/2024   Pt. Will be independent with HEPs for RUE. Baseline: 03/23/24: Independent, ongoing 01/04/24: Independent 11/09/2023: Independent Eval: No current HEP Goal status:Ongoing  LONG TERM GOALS: Target date: 06/15/2024  1.  Pt. Will increase right shoulder abduction by 10 degrees to  be able to reach up for hair care/grooming. Baseline: 03/23/24: 72(110) Pt. is independent with donning his shirt. However, requires increased time. 03/21/24: Pt. requires increased time to donn his shirt.  01/04/24: 31(881) Pt. reports having donning his shirt is easier with his new routine 11/09/23: Right 60/(110) Eval: Right 52(100) Goal status:  Achieved, revised  (03/23/24) to be able to perform hair care.  2.  Pt. Will increase BUE strength by 2 mm grades to assist with ADLs. Baseline:03/23/24: Right: shoulder flexion: 3-/5, abduction: 3-/5, elbow flexion 4-/5, extension: 4-/5, wrist extension: 3+/5. Eval: Right: shoulder flexion: 3/5, abduction: 2/5, elbow flexion/extension: 3/5, wrist extension: 3/5. Left: 4-/5 overall  03/21/24: Right: shoulder flexion: 3-/5, abduction: 3-/5, elbow flexion 4-/5, extension: 4-/5, wrist extension: 3/5. Left: 4+/5 overall Eval: Right: shoulder flexion: 3/5, abduction: 2/5, elbow flexion/extension: 3/5, wrist extension: 3/5. Left: 4-/5 overall  01/04/24:  Right: shoulder flexion: 3-/5, abduction: 2/5, elbow flexion 4-/5, extension: 3+/5, wrist extension: 3/5. Left: 4/5 overallEval: Right: shoulder flexion: 3/5, abduction: 2/5, elbow flexion/extension: 3/5, wrist extension: 3/5. Left: 4-/5 overall4/08/25: Right: shoulder flexion: 3-/5, abduction: 2/5, elbow flexion/extension: 3+/5, wrist extension: 3/5. Left: 4/5 overallEval: Right: shoulder flexion: 3/5, abduction: 2/5, elbow flexion/extension: 3/5, wrist extension: 3/5. Left: 4-/5 overall Goal status: Ongoing  3.  Pt. Will perform self-feeding with modified independence Baseline: 03/23/2024: Pt. Requires set-up, and assist to perform scooping action, and to perform hand to mouth patterns. Pt. could benefit from an adaptive utensil.03/21/24: Pt. Requires is improving with hand to mouth patterns,however continues to have difficulty scooping his food, and consistency with hand to mouth patterns. 01/04/24: Pt. reports making improvements with self-feeding. Pt. has difficulty at times with bringing his spoon to his mouth, often bringing an empty spoon to his mouth 11/09/23: Pt. Continues to have difficulty using dominant right hand.  Drops food when scooping, Both hands for stabilizing drinks, and uses a straw. Unable to cut food. Eval: Difficulty using dominant right hand. Drops food when scooping, Both hands for stabilizing drinks, and uses a straw. Unable to cut food. Goal status: Ongoing  4.  Pt. Will demonstrate independence with proper A/E techniques/compensatory strategies for  ADL/IADLs.  Baseline: 03/23/2024:  Continue 03/21/24: Continue 01/04/24: Continue ongoing as needed. 11/09/23: Continue Eval: Education to be provided Goal status: Ongoing  5.  Pt. Will write a sentence with 75% legibility with modified independence Baseline: 03/23/24: 1 min. & 52 sec. to complete one sentence with 60% legibility. Pt. Presents with micrographia towards the end of the sentence. 03/21/24: Continue 01/04/24:  One sentence completed in 2 min. & 51 sec. With 50% legibility.11/09/2023: 50% legibility for one sentence. Eval: 50% legibility for one printed sentence Goal status: Ongoing   6.  Pt. Will improve bilateral [facility] skills by 3 sec. on the 9 Hole Peg Test to be able to manipulate small objects.  Baseline: 03/23/24: 9 Hole Peg test: Right: 1 min.; Left: 46 sec. 03/21/24: Pt. Has difficulty efficiently manipulating small objects. Pt. 9 Hole Peg Test: R: 1 min. & 11 sec.  L: 46 sec. 11/09/2023: 9 Hole Peg test: Right: 1 min. & 22 sec; Left: 46 sec Eval: 9 Hole Peg test: Right: 1 min. & 46 sec; Left: 53 sec Goal status: Ongoing  7. Pt. Will improve the Mam-20 score by 5 points to reflect ADL/IADL improvement. Baseline:03/23/2024: TBD; Eval: Mam-20 sum score: 53/80, Mam Measure score: 52.4 Goal status  Ongoing  8. Pt. will improve hand function skills to be able independently, and efficiently pick up, and place items (tv remotes/utensils/pens) into the proper position without using assistance from the left hand.  Baseline: 03/23/2024: Pt. Requires  assist from the left hand to position items within his right hand in preparation for using the  item.    Goal status: New   ASSESSMENT:  CLINICAL IMPRESSION: Pt with good tolerance to passive stretching this date with seated and standing bilat shoulder stretches.  Focus on handwriting this date with mild micrographia noted, primarily decreased letter and word spacing, but improved with visual cues and demo after pt's attention was brought to examples of micrographia within his writing.  Pt found benefit from 1 pen weight on pen, rubber pen grip, dycem beneath paper, and increasing forearm support on table top to improve handwriting legibility.  Pt able to write name with 100% legibility with slight tremble within writing.  Issued rubber pen grip for writing practice at home with encouragement to work towards daily writing practice with lined paper when making grocery lists, etc.  Pt receptive to all education provided.  Pt. continues to benefit from OT services to work on improving overall bilateral UE functioning in order to maximize engagement in, and efficiency in ADLs, and IADL tasks, and provide education about compensatory strategies.    PERFORMANCE DEFICITS: in functional skills including ADLs, IADLs, coordination, dexterity, proprioception, sensation, ROM, strength, pain, Fine motor control, Gross motor control, and UE functional use, cognitive skills including , and psychosocial skills including coping strategies, environmental adaptation, and routines and behaviors.   IMPAIRMENTS: are limiting patient from ADLs, IADLs, and leisure.   CO-MORBIDITIES: may have co-morbidities  that affects occupational performance. Patient will benefit from skilled OT to address above impairments and improve overall function.  MODIFICATION OR ASSISTANCE TO COMPLETE EVALUATION: Min-Moderate modification of tasks or assist with assess necessary to complete an evaluation.  OT OCCUPATIONAL PROFILE AND HISTORY: Detailed assessment: Review of records and additional review of physical, cognitive, psychosocial  history related to current functional performance.  CLINICAL DECISION MAKING: Moderate - several treatment options, min-mod task modification necessary  REHAB POTENTIAL: Good  EVALUATION COMPLEXITY: Moderate  PLAN:  OT FREQUENCY: 2x/week  OT DURATION: 12  weeks  PLANNED INTERVENTIONS: 97168 OT Re-evaluation, 97535 self care/ADL training, 02889 therapeutic exercise, 97530 therapeutic activity, 97112 neuromuscular re-education, 97140 manual therapy, 97018 paraffin, 02989 moist heat, 97034 contrast bath, 97760 Orthotics management and training, 02239 Splinting (initial encounter), energy conservation, patient/family education, and DME and/or AE instructions  RECOMMENDED OTHER SERVICES:   PT  CONSULTED AND AGREED WITH PLAN OF CARE: Patient  PLAN FOR NEXT SESSION: see above   Inocente Blazing, MS, OTR/L  04/14/2024, 6:00 PM

## 2024-04-18 ENCOUNTER — Ambulatory Visit

## 2024-04-18 DIAGNOSIS — R2681 Unsteadiness on feet: Secondary | ICD-10-CM

## 2024-04-18 DIAGNOSIS — R278 Other lack of coordination: Secondary | ICD-10-CM

## 2024-04-18 DIAGNOSIS — M6281 Muscle weakness (generalized): Secondary | ICD-10-CM | POA: Diagnosis not present

## 2024-04-18 DIAGNOSIS — R269 Unspecified abnormalities of gait and mobility: Secondary | ICD-10-CM

## 2024-04-18 DIAGNOSIS — R262 Difficulty in walking, not elsewhere classified: Secondary | ICD-10-CM

## 2024-04-18 DIAGNOSIS — R531 Weakness: Secondary | ICD-10-CM

## 2024-04-18 NOTE — Therapy (Signed)
 OUTPATIENT PHYSICAL THERAPY TREATMENT  Patient Name: SHOUA RESSLER MRN: [id number] DOB:1965/08/18, 58 y.o., male Today's Date: 04/18/2024  PCP: Odell Chard, Edra GRADE, MD  REFERRING PROVIDER: Odell Chard, Edra GRADE, MD   END OF SESSION:    PT End of Session - 04/18/24 1028     Visit Number 36    Number of Visits 48    Date for PT Re-Evaluation 05/23/24    Authorization Type Medicare 2025    Progress Note Due on Visit 34    PT Start Time 1102    PT Stop Time 1144    PT Time Calculation (min) 42 min    Equipment Utilized During Treatment Gait belt    Activity Tolerance Patient tolerated treatment well    Behavior During Therapy WFL for tasks assessed/performed              Past Medical History:  Diagnosis Date   Hypertension    Past Surgical History:  Procedure Laterality Date   DG HAND LEFT COMPLETE ([facility] HX) Left 04/13/1999   There are no active problems to display for this patient.  ONSET DATE: 2022 is when he noticed the change and that was around the same time he was diagnosed with Parkinson's  REFERRING DIAG:  G20.A1 (ICD-10-CM) - Parkinson disease (HCC)  Z74.1 (ICD-10-CM) - Requires daily assistance for activities of daily living (ADL) and comfort needs   THERAPY DIAG:  Generalized weakness  Difficulty in walking, not elsewhere classified  Unsteadiness on feet  Abnormality of gait  Muscle weakness (generalized)  Other lack of coordination  Rationale for Evaluation and Treatment: Rehabilitation  SUBJECTIVE:                                                                                                                                                                                             SUBJECTIVE STATEMENT:   Pt reports his LBP continues to improve but still there. Currently 2/10 NPS.    PERTINENT HISTORY: Parkinsonism, Vitamin D deficiency, Hyperlipidemia, HTN, poor medication compliance (per chart review)  PAIN:  Are you having  pain? Rt shoulder/neck 4-5/10.   PRECAUTIONS: Fall  RED FLAGS: None   WEIGHT BEARING RESTRICTIONS: No  FALLS: Has patient fallen in last 6 months? Yes. 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   PATIENT GOALS: Get my balance right with my walking, help get my R arm stronger and be able to move it more, improve strength in my legs (specifically the Right)  OBJECTIVE:  Note: Objective measures were completed at Evaluation unless otherwise noted.                                                                                                                             TREATMENT DATE: 04/18/24  Unless otherwise stated, CGA was provided and gait belt donned in order to ensure pt safety throughout session.  Pt ambulates into therapy clinic using cane with close SBA.    TA- To improve functional movements patterns for everyday tasks   Octane - B UE and B LE for priming activation of reciprocal movement patterns and cardiovascular training against the following levels:  Level 4 x 5 min   STS standard height chair x 10 reps with min UE support and VC for technique to scoot out to edge of chair and lean trunk forward for best technique.   Standing holding onto basketball- twisting to R then to L then raise arms with ball to jump shot position and repeat x 15 reps CGA. VC's for large amplitude motion.   Neuro  Re-Ed:  High knee march- in // bars with min UE support- VC for step height -down and back x 8- VC to slow down and for step height- Min use of UE for support with some unsteadiness.   Heel to toe gait activity (Standing in // bars) RTB around ankle- swinging LE and heel strike onto 1/2 foam 2 x 10 reps each side- Increased difficulty with swing through- Patient reported as challenging.     Gait no AD 300' working on reciprocal gait and arm swing. VC's for foot clearance.   Step onto appropriate (called out color of cone or foam) in // bars without UE support- light tap onto top of cone or foam- x 20 taps- using left or right foot as apporpriate- min UE support at times due to some unsteadiness.    PATIENT EDUCATION: Education details: exercise technique, reassessment  Person educated: Patient Education method: Explanation, demo, vc Education comprehension: verbalized understanding and needs further education  HOME EXERCISE PROGRAM:  Access Code: [email or web address] URL: [email or web address] Date: 12/02/2023 Prepared by: Connell Kiss  Exercises - Sidelying Thoracic Rotation with Open Book  - 1 x daily - 7 x weekly - 2 sets - 10 reps - Supine Shoulder External Rotation in Abduction  - 1 x daily - 7 x weekly - 2 sets - 10 reps - 10 seconds hold - Seated Scapular Retraction  - 1 x daily - 7 x weekly - 3 sets - 10 reps - 3 seconds hold -  Sit to Stand with Arm Swing  - 1 x daily - 7 x weekly - 2 sets - 10 reps   GOALS: Goals reviewed with patient? Yes  SHORT TERM GOALS: Target date: 04/27/2024    Patient will be independent in home exercise program to improve strength/mobility for better functional independence with ADLs. Baseline: need to initiate 10/07/2023: provided 12/02/2023: updated Goal status: IN PROGRESS   LONG TERM GOALS: Target date: 05/23/2024   1.  Patient (< 57 years old) will complete five times sit to stand test in < 12 seconds indicating an increased LE  strength and improved balance. Baseline: 09/28/23: 38.53 seconds 11/09/2023: 22.13 seconds with arms across chest 01/04/2024: 20.12 seconds without UE support 03/21/24: 17.20 seconds without UE support  Goal status: IN PROGRESS   2. Patient will reduce timed up and go to <11 seconds to reduce fall risk and demonstrate improved transfer/gait ability. Baseline: 09/28/23: 19.46 seconds without AD 11/09/2023: 16.96 seconds, no AD, with close SBA for balance safety 01/04/2024: 15.845 seconds, no AD with close SBA for safety 03/21/24: 14.83 seconds, no AD with close SBA for safety  Goal status: IN PROGRESS  3.  Patient will increase 10 meter walk test to >1.26m/s as to improve gait speed for better community ambulation and to reduce fall risk. Baseline: 09/28/23: 0.62m/s without AD 11/09/2023: 0.89 m/s (11.50 seconds seconds 10.94 seconds) no AD, with close SBA for safety 01/04/2024: 0.935 m/s without AD, CGA/close SBA for safety 03/21/24: 0.8 m/s without AD, CGA/close SBA for safety Goal status: IN PROGRESS  4.  Patient will increase six minute walk test distance to >1063ft for progression to community ambulator and improve gait ability Baseline: 10/04/2023: 510ft (148m at 0.91m/s avg) 11/09/2023: 1061 feet (323 meters, Avg speed 0.897 m/s) without AD Goal status: MET and Advanced, ONGOING with updated goal 11/09/2023 Goal Updated: distance > 1330ft 01/13/2024: 832  ft mostly without SPC but then pt needed SPC due to fatigue, required one break (standing) 03/21/24: 750' with mostly SPC due to fatigue    5. Patient will increase MiniBest Test score to >18/28 to indicate a reduced risk for falling and demonstrate increased independence with functional mobility and ADLs.  Baseline: 10/04/2023: 8/28  11/16/2023: 16/28  01/22/2024: deferred 6/17: 20/28 03/21/24: 17/28  Goal status: Re-instated due to score below goal   6. Patient will increase ABC scale score >80% to demonstrate better functional mobility and better  confidence with ADLs.   Baseline: 09/28/23: 17.5% 11/09/2023: 21.25% - pt reports he rated the test as is if he was not using his walker because this is his long term goal  01/04/2024: 35.625% - continuing to rate it as if he is not using his walker 03/21/24: 23% - rates as if he is not using SPC   Goal status: IN PROGRESS   ASSESSMENT:  CLINICAL IMPRESSION:  Treatment continued with Large amplitude designed movement. Patient responded well overall - responsive to verbal cues for posture and improved step length overall. He was unsteady at times with dynamic standing balance but overall able to improve with practice today.  Pt will continue to benefit from skilled physical therapy intervention to address impairments, improve QOL, and attain therapy goals.    OBJECTIVE IMPAIRMENTS: Abnormal gait, decreased activity tolerance, decreased balance, decreased coordination, decreased endurance, decreased knowledge of condition, decreased knowledge of use of DME, decreased mobility, difficulty walking, decreased ROM, decreased strength, decreased safety awareness, improper body mechanics, postural dysfunction, and pain.   ACTIVITY LIMITATIONS: carrying, lifting,  bending, standing, squatting, sleeping, stairs, transfers, bed mobility, continence, bathing, toileting, dressing, reach over head, hygiene/grooming, and locomotion level  PARTICIPATION LIMITATIONS: meal prep, cleaning, laundry, and community activity  PERSONAL FACTORS: Fitness and 3+ comorbidities: Vitamin D deficiency, Hyperlipidemia, and HTN are also affecting patient's functional outcome.   REHAB POTENTIAL: Good  CLINICAL DECISION MAKING: Evolving/moderate complexity  EVALUATION COMPLEXITY: Moderate  PLAN:  PT FREQUENCY: 1-2x/week  PT DURATION: 12 weeks  PLANNED INTERVENTIONS: 97164- PT Re-evaluation, 97110-Therapeutic exercises, 97530- Therapeutic activity, 97112- Neuromuscular re-education, 97535- Self Care, [id number]- Manual therapy,  [phone]- Gait training, [phone]- Orthotic Fit/training, [phone]- Canalith repositioning, [phone]- Electrical stimulation (manual), Patient/Family education, Balance training, Stair training, Joint mobilization, Vestibular training, Visual/preceptual remediation/compensation, DME instructions, Cryotherapy, and Moist heat  PLAN FOR NEXT SESSION:  Follow-up on interest in joining [facility] to allow him to use Octane device outside of therapy Continue postural strengthening and positioning.  Large amplitude movement. Safety with gait and at home AD.    Chyrl London, PT Physical Therapist- [facility] 8:24 PM 04/18/24

## 2024-04-19 NOTE — Therapy (Signed)
 Occupational Therapy Neuro Treatment Note   Patient Name: Daniel Daugherty MRN: [id number] DOB:[birth year], 58 y.o., male Today's Date: 04/19/2024  PCP: N/A REFERRING PROVIDER: Odell Tor Edra CINDERELLA, MD  END OF SESSION:  OT End of Session - 04/19/24 0940     Visit Number [id number]    Number of Visits 48    Date for OT Re-Evaluation 06/15/24    OT Start Time 1145    OT Stop Time 1230    OT Time Calculation (min) 45 min    Activity Tolerance Patient tolerated treatment well    Behavior During Therapy WFL for tasks assessed/performed            Past Medical History:  Diagnosis Date   Hypertension    Past Surgical History:  Procedure Laterality Date   DG HAND LEFT COMPLETE ([facility] HX) Left 04/13/1999   There are no active problems to display for this patient.  ONSET DATE: 05/2021  REFERRING DIAG: Parkinson's Disease  THERAPY DIAG:  Muscle weakness (generalized)  Other lack of coordination  Rationale for Evaluation and Treatment: Rehabilitation  SUBJECTIVE:  SUBJECTIVE STATEMENT: Pt reports doing well today. Pt accompanied by: self  PERTINENT HISTORY:  Pt. is a 58 y.o. male who was diagnosed with Parkinson's Disease in 2022. Pt. reports having had a progressive decline in function since having hernia surgery, and right shoulder rotator cuff surgery. PMHx included: HTN. Of note; Pt. was being followed by [facility], and receiving Home health therapy services. Pt. changed physicians, and was referred for outpatient rehab services here.  PRECAUTIONS: None  WEIGHT BEARING RESTRICTIONS: No  PAIN:  Are you having pain? 0/10 pain  FALLS: Has patient fallen in last 6 months? No, 1 small slip near the bed tripped over shoes  LIVING ENVIRONMENT: Lives with: lives alone Lives in: [place],  Stairs: Entry level Has following equipment at home: Quad cane small base, rollator, shower chair, grab bars, hand rail at [employer]  PLOF: Independent  PATIENT GOALS: Improve  self-care  OBJECTIVE:  Note: Objective measures were completed at Evaluation unless otherwise noted.  HAND DOMINANCE: Right  ADLs:  Assist at home:  Ex wife, and children assist several times a week  Transfers/ambulation related to ADLs: Modified Independent Eating: Difficulty using dominant right hand. Drops food when scooping, Both hands for stabilizing drinks, and uses a straw. Unable to cut food.  Grooming: Increased time required for all grooming tasks. UB Dressing: Difficulty managing buttons LB Dressing: Less assist with pants with elastic, more assist with dress pants Toileting: Increased time for hygiene. Bathing: Requires increased time to complete Tub Shower transfers: Modified Independence with increased time  IADLs: Shopping: Son assists with grocery shopping Light housekeeping: Son's assist. Pt. Does light rising off of dishes. Meal Prep: Increased time. Independent with microwave, and air fryer. Difficulty opening bottles, containers Community mobility:  Transportation, light driving to grocery store Medication management:  Son's set-up weekly pillbox, Pt. Is responsible for taking the medications Financial management: Son's assist with monthly finances Handwriting: 50% legible Hobbies: Dancing, watching sports Career/work: Restaurant manager, fast food  MOBILITY STATUS: Uses a cane; shuffling gait  FUNCTIONAL OUTCOME MEASURES: Mam-20 sum score: 53/80, MAM Measure score: 52.4  UPPER EXTREMITY ROM:    Active ROM Right eval Right 11/09/23 Right  01/04/24 Right 03/23/24 Left Eval [facility] overall  Shoulder flexion WFL [phone]) [phone])   Shoulder abduction 52(100) 60(110) 31(881) 72(110)   Shoulder adduction       Shoulder extension  Shoulder internal rotation       Shoulder external rotation       Elbow flexion [facility] WFL   Elbow extension [facility] WFL   Wrist flexion       Wrist extension [facility] WFL   Wrist ulnar deviation       Wrist  radial deviation       Wrist pronation       Wrist supination       (Blank rows = not tested)  UPPER EXTREMITY MMT:     MMT Right eval Right 11/09/23 Right 01/04/24 Right 03/23/24 Left Eval 4-/5 overall Left 11/09/23 4/5 Overall  Shoulder flexion 3/5 3-/5 3-/5 3-/5    Shoulder abduction 2/5 2/5 2+/5 3-/5    Shoulder adduction        Shoulder extension        Shoulder internal rotation        Shoulder external rotation        Middle trapezius        Lower trapezius        Elbow flexion 3/5 3+/5 3+/5 4-/5    Elbow extension 3/5 3+5 4-/5 4-/5    Wrist flexion        Wrist extension 3/5 3/5 3/5 3+/5    Wrist ulnar deviation        Wrist radial deviation        Wrist pronation        Wrist supination        (Blank rows = not tested)  HAND FUNCTION:   Eval: Grip strength: Right: 50 lbs; Left: 34 lbs, Lateral pinch: Right: 17 lbs, Left: 9 lbs, 3 point pinch: Right: 12 lbs, Left: 11 lbs  11/09/23: Grip strength: Right: 60 lbs; Left: 35 lbs, Lateral pinch: Right: 19 lbs, Left: 11 lbs, 3 point pinch: Right: 13 lbs, Left: 9 lbs  01/04/24:Grip strength: Right: 40 lbs; Left: 41 lbs, Lateral pinch: Right: 17 lbs, Left: 8 lbs, 3 point pinch: Right: 9 lbs, Left: 10 lbs  03/23/24:Grip strength: Right: 74 lbs; Left: 55 lbs, Lateral pinch: Right: 15 lbs, Left: 10 lbs, 3 point pinch: Right: 9 lbs, Left: 11 lbs   COORDINATION:  Eval: 9 Hole Peg test: Right: 1 min. & 46 sec; Left: 53 sec  11/09/2023: 9 Hole Peg test: Right: 1 min. & 22 sec; Left: 46 sec  01/04/24: 9 Hole Peg test: Right: 1 min. & 11 sec; Left: 46 sec  03/23/24: 9 Hole Peg test: Right: 1 min.; Left: 46 sec  SENSATION:  Light touch: WFL Proprioception: WFL  EDEMA: N/A  COGNITION: Overall cognitive status: Within functional limits for tasks assessed  VISION:  Does not wear glasses.  VISION ASSESSMENT: To be further assessed in functional context  PERCEPTION: WFL  PRAXIS: Impaired: Motor planning  TREATMENT DATE: 04/18/24 Therapeutic Exercise: 5 min -Standing wall stretch for thoracic ext, OT providing passive shoulder retraction stretch  Self Care: -Focus on handwriting strategies to increase legibility and reduce micrographia  -Use of 1 pen weight, rubber pen grip, dycem beneath paper for increased proprioceptive input -Visual targets made on paper for pt to lengthen letters and spacing of first/middle/last name -Min vc/tactile cues for posture/positioning to maximize distal support of BUEs on table top  -Practiced signing name  -Practiced writing tip/total/signing a receipt for a restaurant  -Reinforced benefits of daily writing practice on lined paper to minimize micrographia and carryover strategies reviewed above -Encouraged pt increase participation in signing name on receipts/medical forms when time allows  -Advised on electric nail trimmers and options to obtain (ex-spouse currently clips nails)  PATIENT EDUCATION: Education details: Public relations account executive Person educated: Patient Education method: Explanation, Demonstration, and Verbal cues Education comprehension: needs further education  HOME EXERCISE PROGRAM: Writing tasks, cane stretches in recliner for UB flexibility  GOALS: Goals reviewed with patient? Yes  SHORT TERM GOALS: Target date: 05/04/2024   Pt. Will be independent with HEPs for RUE. Baseline: 03/23/24: Independent, ongoing 01/04/24: Independent 11/09/2023: Independent Eval: No current HEP Goal status:Ongoing  LONG TERM GOALS: Target date: 06/15/2024  1.  Pt. Will increase right shoulder abduction by 10 degrees to  be able to reach up for hair care/grooming. Baseline: 03/23/24: 72(110) Pt. is independent with donning his shirt. However, requires increased time. 03/21/24: Pt. requires increased time to donn his shirt. 01/04/24: 31(881) Pt. reports having  donning his shirt is easier with his new routine 11/09/23: Right 60/(110) Eval: Right 52(100) Goal status:  Achieved, revised  (03/23/24) to be able to perform hair care.  2.  Pt. Will increase BUE strength by 2 mm grades to assist with ADLs. Baseline:03/23/24: Right: shoulder flexion: 3-/5, abduction: 3-/5, elbow flexion 4-/5, extension: 4-/5, wrist extension: 3+/5. Eval: Right: shoulder flexion: 3/5, abduction: 2/5, elbow flexion/extension: 3/5, wrist extension: 3/5. Left: 4-/5 overall  03/21/24: Right: shoulder flexion: 3-/5, abduction: 3-/5, elbow flexion 4-/5, extension: 4-/5, wrist extension: 3/5. Left: 4+/5 overall Eval: Right: shoulder flexion: 3/5, abduction: 2/5, elbow flexion/extension: 3/5, wrist extension: 3/5. Left: 4-/5 overall  01/04/24:  Right: shoulder flexion: 3-/5, abduction: 2/5, elbow flexion 4-/5, extension: 3+/5, wrist extension: 3/5. Left: 4/5 overallEval: Right: shoulder flexion: 3/5, abduction: 2/5, elbow flexion/extension: 3/5, wrist extension: 3/5. Left: 4-/5 overall4/08/25: Right: shoulder flexion: 3-/5, abduction: 2/5, elbow flexion/extension: 3+/5, wrist extension: 3/5. Left: 4/5 overallEval: Right: shoulder flexion: 3/5, abduction: 2/5, elbow flexion/extension: 3/5, wrist extension: 3/5. Left: 4-/5 overall Goal status: Ongoing  3.  Pt. Will perform self-feeding with modified independence Baseline: 03/23/2024: Pt. Requires set-up, and assist to perform scooping action, and to perform hand to mouth patterns. Pt. could benefit from an adaptive utensil.03/21/24: Pt. Requires is improving with hand to mouth patterns,however continues to have difficulty scooping his food, and consistency with hand to mouth patterns. 01/04/24: Pt. reports making improvements with self-feeding. Pt. has difficulty at times with bringing his spoon to his mouth, often bringing an empty spoon to his mouth 11/09/23: Pt. Continues to have difficulty using dominant right hand. Drops food when scooping, Both hands  for stabilizing drinks, and uses a straw. Unable to cut food. Eval: Difficulty using dominant right hand. Drops food when scooping, Both hands for stabilizing drinks, and uses a straw. Unable to cut food. Goal status: Ongoing  4.  Pt. Will demonstrate independence with proper A/E techniques/compensatory strategies for ADL/IADLs.  Baseline:  03/23/2024:  Continue 03/21/24: Continue 01/04/24: Continue ongoing as needed. 11/09/23: Continue Eval: Education to be provided Goal status: Ongoing  5.  Pt. Will write a sentence with 75% legibility with modified independence Baseline: 03/23/24: 1 min. & 52 sec. to complete one sentence with 60% legibility. Pt. Presents with micrographia towards the end of the sentence. 03/21/24: Continue 01/04/24:  One sentence completed in 2 min. & 51 sec. With 50% legibility.11/09/2023: 50% legibility for one sentence. Eval: 50% legibility for one printed sentence Goal status: Ongoing   6.  Pt. Will improve bilateral [facility] skills by 3 sec. on the 9 Hole Peg Test to be able to manipulate small objects.  Baseline: 03/23/24: 9 Hole Peg test: Right: 1 min.; Left: 46 sec. 03/21/24: Pt. Has difficulty efficiently manipulating small objects. Pt. 9 Hole Peg Test: R: 1 min. & 11 sec.  L: 46 sec. 11/09/2023: 9 Hole Peg test: Right: 1 min. & 22 sec; Left: 46 sec Eval: 9 Hole Peg test: Right: 1 min. & 46 sec; Left: 53 sec Goal status: Ongoing  7. Pt. Will improve the Mam-20 score by 5 points to reflect ADL/IADL improvement. Baseline:03/23/2024: TBD; Eval: Mam-20 sum score: 53/80, Mam Measure score: 52.4 Goal status  Ongoing  8. Pt. will improve hand function skills to be able independently, and efficiently pick up, and place items (tv remotes/utensils/pens) into the proper position without using assistance from the left hand.  Baseline: 03/23/2024: Pt. Requires  assist from the left hand to position items within his right hand in preparation for using the item.    Goal status:  New   ASSESSMENT:  CLINICAL IMPRESSION: Continued focus on handwriting this date with mild micrographia noted, primarily decreased letter and word spacing, but improved with visual cues and demo after pt's attention was brought to examples of micrographia within his writing.  Pt continues to find benefit from 1 pen weight on pen, rubber pen grip, dycem beneath paper, and increasing forearm support on table top to improve handwriting legibility.  Pt practiced signing name and filling out a tip/total on a simulated restaurant receipt, showing good legibility, but requiring increased time.  Pt reports that his family always signs the check when out to eat as they don't want to wait for pt and prefer he get a head start to walk out to the car.  OT continues to encourage pt practice handwriting skills at home daily, and begin to practice just signing name on receipts or medical forms when time allows.  Pt. continues to benefit from OT services to work on improving overall bilateral UE functioning in order to maximize engagement in, and efficiency in ADLs, and IADL tasks, and provide education about compensatory strategies.    PERFORMANCE DEFICITS: in functional skills including ADLs, IADLs, coordination, dexterity, proprioception, sensation, ROM, strength, pain, Fine motor control, Gross motor control, and UE functional use, cognitive skills including , and psychosocial skills including coping strategies, environmental adaptation, and routines and behaviors.   IMPAIRMENTS: are limiting patient from ADLs, IADLs, and leisure.   CO-MORBIDITIES: may have co-morbidities  that affects occupational performance. Patient will benefit from skilled OT to address above impairments and improve overall function.  MODIFICATION OR ASSISTANCE TO COMPLETE EVALUATION: Min-Moderate modification of tasks or assist with assess necessary to complete an evaluation.  OT OCCUPATIONAL PROFILE AND HISTORY: Detailed assessment:  Review of records and additional review of physical, cognitive, psychosocial history related to current functional performance.  CLINICAL DECISION MAKING: Moderate - several treatment options, min-mod task  modification necessary  REHAB POTENTIAL: Good  EVALUATION COMPLEXITY: Moderate  PLAN:  OT FREQUENCY: 2x/week  OT DURATION: 12 weeks  PLANNED INTERVENTIONS: 97168 OT Re-evaluation, 97535 self care/ADL training, 02889 therapeutic exercise, 97530 therapeutic activity, 97112 neuromuscular re-education, 97140 manual therapy, 97018 paraffin, 02989 moist heat, 97034 contrast bath, 97760 Orthotics management and training, 02239 Splinting (initial encounter), energy conservation, patient/family education, and DME and/or AE instructions  RECOMMENDED OTHER SERVICES: PT  CONSULTED AND AGREED WITH PLAN OF CARE: Patient  PLAN FOR NEXT SESSION: see above   Inocente Blazing, MS, OTR/L  04/19/2024, 9:42 AM

## 2024-04-20 ENCOUNTER — Ambulatory Visit

## 2024-04-20 DIAGNOSIS — M6281 Muscle weakness (generalized): Secondary | ICD-10-CM

## 2024-04-20 DIAGNOSIS — R262 Difficulty in walking, not elsewhere classified: Secondary | ICD-10-CM

## 2024-04-20 DIAGNOSIS — R269 Unspecified abnormalities of gait and mobility: Secondary | ICD-10-CM

## 2024-04-20 DIAGNOSIS — R531 Weakness: Secondary | ICD-10-CM

## 2024-04-20 DIAGNOSIS — R2681 Unsteadiness on feet: Secondary | ICD-10-CM

## 2024-04-20 DIAGNOSIS — R278 Other lack of coordination: Secondary | ICD-10-CM

## 2024-04-20 NOTE — Therapy (Signed)
 OUTPATIENT PHYSICAL THERAPY TREATMENT  Patient Name: Daniel Daugherty MRN: [id number] DOB:Feb 19, 1966, 58 y.o., male Today's Date: 04/20/2024  PCP: Odell Chard, Edra GRADE, MD  REFERRING PROVIDER: Odell Chard, Edra GRADE, MD   END OF SESSION:    PT End of Session - 04/20/24 1105     Visit Number 37    Number of Visits 48    Date for Recertification  05/23/24    Authorization Type Medicare 2025    Progress Note Due on Visit 34    PT Start Time 1105    PT Stop Time 1145    PT Time Calculation (min) 40 min    Equipment Utilized During Treatment Gait belt    Activity Tolerance Patient tolerated treatment well    Behavior During Therapy WFL for tasks assessed/performed         Past Medical History:  Diagnosis Date   Hypertension    Past Surgical History:  Procedure Laterality Date   DG HAND LEFT COMPLETE ([facility] HX) Left 04/13/1999   There are no active problems to display for this patient.  ONSET DATE: 2022 is when he noticed the change and that was around the same time he was diagnosed with Parkinson's  REFERRING DIAG:  G20.A1 (ICD-10-CM) - Parkinson disease (HCC)  Z74.1 (ICD-10-CM) - Requires daily assistance for activities of daily living (ADL) and comfort needs   THERAPY DIAG:  Generalized weakness  Difficulty in walking, not elsewhere classified  Unsteadiness on feet  Abnormality of gait  Muscle weakness (generalized)  Other lack of coordination  Rationale for Evaluation and Treatment: Rehabilitation  SUBJECTIVE:                                                                                                                                                                                             SUBJECTIVE STATEMENT:   Pt reports he is doing better than the previous week.  Pt notes that his back is sore, but not nearly as bad as it was last week.      PERTINENT HISTORY: Parkinsonism, Vitamin D deficiency, Hyperlipidemia, HTN, poor medication compliance  (per chart review)  PAIN:  Are you having pain? Rt shoulder/neck 4-5/10.   PRECAUTIONS: Fall  RED FLAGS: None   WEIGHT BEARING RESTRICTIONS: No  FALLS: Has patient fallen in last 6 months? Yes. 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   PATIENT GOALS: Get my balance right with my walking, help get my R arm stronger and be able to move it more, improve strength in my legs (specifically the Right)  OBJECTIVE:  Note: Objective measures were completed at Evaluation unless otherwise noted.                                                                                                                             TREATMENT DATE: 04/20/24  Pt ambulates into therapy clinic using cane with close SBA.   Unless otherwise stated, CGA was provided and gait belt donned in order to ensure pt safety throughout session.   TA- To improve functional movements patterns for everyday tasks   Octane - B UE and B LE for priming activation of reciprocal movement patterns and cardiovascular training against the following levels:  Level 1-5 x 8 min   STS standard height chair x10 reps without UE support and VC for technique to scoot out to edge of chair and lean trunk forward for best technique.   Seated holding basketball, twisting to the L then R then mimicking a shot, x5  Standing holding onto basketball- twisting to R then  to L then raise arms with ball to jump shot position and repeat 2x10 reps CGA. VC's for large amplitude motion.  Standing chest passes with basketball, forward and from the left and right, x10 each direction  Standing bounce passes with basketball, forward and from the left and right, x10 each direction  Standing physioball roll into forward flexion with lean into ball once in full flexion, x10    Neuro Re-Ed:  High knee march- in // bars with min UE support- VC for step height -down and back x10- VC to slow down and for step height- Min use of UE for support  Backwards ambulation in // bars with min UE support and visual cues for increased step length  Heel to toe gait activity (Standing in // bars) RTB around ankle- swinging LE and heel strike onto 1/2 foam 2 x 10 reps each side- Increased difficulty with swing through- Patient reported as challenging.     Gait no AD 450' working on reciprocal gait and arm swing.  VC's for foot clearance.      PATIENT EDUCATION: Education details: exercise technique, reassessment  Person educated: Patient Education method: Explanation, demo, vc Education comprehension: verbalized understanding and needs further education  HOME EXERCISE PROGRAM:  Access Code: [email or web address] URL: [email or web address] Date: 12/02/2023 Prepared by: Connell Kiss  Exercises - Sidelying Thoracic Rotation with Open Book  - 1 x daily - 7 x weekly - 2 sets - 10 reps - Supine Shoulder External Rotation in Abduction  -  1 x daily - 7 x weekly - 2 sets - 10 reps - 10 seconds hold - Seated Scapular Retraction  - 1 x daily - 7 x weekly - 3 sets - 10 reps - 3 seconds hold - Sit to Stand with Arm Swing  - 1 x daily - 7 x weekly - 2 sets - 10 reps   GOALS: Goals reviewed with patient? Yes  SHORT TERM GOALS: Target date: 04/27/2024    Patient will be independent in home exercise program to improve strength/mobility for better functional independence with  ADLs. Baseline: need to initiate 10/07/2023: provided 12/02/2023: updated Goal status: IN PROGRESS   LONG TERM GOALS: Target date: 05/23/2024   1.  Patient (< 89 years old) will complete five times sit to stand test in < 12 seconds indicating an increased LE strength and improved balance. Baseline: 09/28/23: 38.53 seconds 11/09/2023: 22.13 seconds with arms across chest 01/04/2024: 20.12 seconds without UE support 03/21/24: 17.20 seconds without UE support  Goal status: IN PROGRESS   2. Patient will reduce timed up and go to <11 seconds to reduce fall risk and demonstrate improved transfer/gait ability. Baseline: 09/28/23: 19.46 seconds without AD 11/09/2023: 16.96 seconds, no AD, with close SBA for balance safety 01/04/2024: 15.845 seconds, no AD with close SBA for safety 03/21/24: 14.83 seconds, no AD with close SBA for safety  Goal status: IN PROGRESS  3.  Patient will increase 10 meter walk test to >1.49m/s as to improve gait speed for better community ambulation and to reduce fall risk. Baseline: 09/28/23: 0.14m/s without AD 11/09/2023: 0.89 m/s (11.50 seconds seconds 10.94 seconds) no AD, with close SBA for safety 01/04/2024: 0.935 m/s without AD, CGA/close SBA for safety 03/21/24: 0.8 m/s without AD, CGA/close SBA for safety Goal status: IN PROGRESS  4.  Patient will increase six minute walk test distance to >1029ft for progression to community ambulator and improve gait ability Baseline: 10/04/2023: 585ft (182m at 0.56m/s avg) 11/09/2023: 1061 feet (323 meters, Avg speed 0.897 m/s) without AD Goal status: MET and Advanced, ONGOING with updated goal 11/09/2023 Goal Updated: distance > 1367ft 01/13/2024: 832  ft mostly without SPC but then pt needed SPC due to fatigue, required one break (standing) 03/21/24: 750' with mostly SPC due to fatigue    5. Patient will increase MiniBest Test score to >18/28 to indicate a reduced risk for falling and demonstrate increased independence with functional mobility  and ADLs.  Baseline: 10/04/2023: 8/28  11/16/2023: 16/28  01/22/2024: deferred 6/17: 20/28 03/21/24: 17/28  Goal status: Re-instated due to score below goal   6. Patient will increase ABC scale score >80% to demonstrate better functional mobility and better confidence with ADLs.   Baseline: 09/28/23: 17.5% 11/09/2023: 21.25% - pt reports he rated the test as is if he was not using his walker because this is his long term goal  01/04/2024: 35.625% - continuing to rate it as if he is not using his walker 03/21/24: 23% - rates as if he is not using SPC   Goal status: IN PROGRESS   ASSESSMENT:  CLINICAL IMPRESSION:   Pt responded well to the exercises well and was able to make good progress towards goals.  Pt is performed well with the basketball twist/shot movement in both sitting and standing.  Pt still lacks full upright posture, but was able to achieve greater postural stability when performing the standing forward physioball roll into forward flexion.  Pt will continue to benefit from improved postural stability during the  next several weeks.   Pt will continue to benefit from skilled therapy to address remaining deficits in order to improve overall QoL and return to PLOF.      OBJECTIVE IMPAIRMENTS: Abnormal gait, decreased activity tolerance, decreased balance, decreased coordination, decreased endurance, decreased knowledge of condition, decreased knowledge of use of DME, decreased mobility, difficulty walking, decreased ROM, decreased strength, decreased safety awareness, improper body mechanics, postural dysfunction, and pain.   ACTIVITY LIMITATIONS: carrying, lifting, bending, standing, squatting, sleeping, stairs, transfers, bed mobility, continence, bathing, toileting, dressing, reach over head, hygiene/grooming, and locomotion level  PARTICIPATION LIMITATIONS: meal prep, cleaning, laundry, and community activity  PERSONAL FACTORS: Fitness and 3+ comorbidities: Vitamin D deficiency,  Hyperlipidemia, and HTN are also affecting patient's functional outcome.   REHAB POTENTIAL: Good  CLINICAL DECISION MAKING: Evolving/moderate complexity  EVALUATION COMPLEXITY: Moderate  PLAN:  PT FREQUENCY: 1-2x/week  PT DURATION: 12 weeks  PLANNED INTERVENTIONS: 97164- PT Re-evaluation, 97110-Therapeutic exercises, 97530- Therapeutic activity, 97112- Neuromuscular re-education, 97535- Self Care, [id number]- Manual therapy, [phone]- Gait training, [phone]- Orthotic Fit/training, [phone]- Canalith repositioning, [phone]- Electrical stimulation (manual), Patient/Family education, Balance training, Stair training, Joint mobilization, Vestibular training, Visual/preceptual remediation/compensation, DME instructions, Cryotherapy, and Moist heat  PLAN FOR NEXT SESSION:  Follow-up on interest in joining [facility] to allow him to use Octane device outside of therapy Continue postural strengthening and positioning.  Large amplitude movement. Safety with gait and at home AD.    Fonda Simpers, PT, DPT Physical Therapist - [facility]  04/20/24, 1:39 PM

## 2024-04-20 NOTE — Therapy (Signed)
 Occupational Therapy Neuro Treatment Note   Patient Name: Daniel Daugherty MRN: [id number] DOB:August 18, 1965, 58 y.o., male Today's Date: 04/20/2024  PCP: N/A REFERRING PROVIDER: Odell Tor Edra CINDERELLA, MD  END OF SESSION:  OT End of Session - 04/20/24 1201     Visit Number [id number]    Number of Visits 48    Date for Recertification  06/15/24    OT Start Time 1145    OT Stop Time 1230    OT Time Calculation (min) 45 min    Activity Tolerance Patient tolerated treatment well    Behavior During Therapy WFL for tasks assessed/performed            Past Medical History:  Diagnosis Date   Hypertension    Past Surgical History:  Procedure Laterality Date   DG HAND LEFT COMPLETE ([facility] HX) Left 04/13/1999   There are no active problems to display for this patient.  ONSET DATE: 05/2021  REFERRING DIAG: Parkinson's Disease  THERAPY DIAG:  Muscle weakness (generalized)  Other lack of coordination  Rationale for Evaluation and Treatment: Rehabilitation  SUBJECTIVE:  SUBJECTIVE STATEMENT: Pt stated that he plans to make a grocery list at home today to practice his writing. Pt accompanied by: self  PERTINENT HISTORY:  Pt. is a 58 y.o. male who was diagnosed with Parkinson's Disease in 2022. Pt. reports having had a progressive decline in function since having hernia surgery, and right shoulder rotator cuff surgery. PMHx included: HTN. Of note; Pt. was being followed by [facility], and receiving Home health therapy services. Pt. changed physicians, and was referred for outpatient rehab services here.  PRECAUTIONS: None  WEIGHT BEARING RESTRICTIONS: No  PAIN: 04/20/24: 2-3/10 pain in low back Are you having pain? 0/10 pain  FALLS: Has patient fallen in last 6 months? No, 1 small slip near the bed tripped over shoes  LIVING ENVIRONMENT: Lives with: lives alone Lives in: [place],  Stairs: Entry level Has following equipment at home: Quad cane small base, rollator, shower chair, grab  bars, hand rail at [employer]  PLOF: Independent  PATIENT GOALS: Improve self-care  OBJECTIVE:  Note: Objective measures were completed at Evaluation unless otherwise noted.  HAND DOMINANCE: Right  ADLs:  Assist at home:  Ex wife, and children assist several times a week  Transfers/ambulation related to ADLs: Modified Independent Eating: Difficulty using dominant right hand. Drops food when scooping, Both hands for stabilizing drinks, and uses a straw. Unable to cut food.  Grooming: Increased time required for all grooming tasks. UB Dressing: Difficulty managing buttons LB Dressing: Less assist with pants with elastic, more assist with dress pants Toileting: Increased time for hygiene. Bathing: Requires increased time to complete Tub Shower transfers: Modified Independence with increased time  IADLs: Shopping: Son assists with grocery shopping Light housekeeping: Son's assist. Pt. Does light rising off of dishes. Meal Prep: Increased time. Independent with microwave, and air fryer. Difficulty opening bottles, containers Community mobility:  Transportation, light driving to grocery store Medication management:  Son's set-up weekly pillbox, Pt. Is responsible for taking the medications Financial management: Son's assist with monthly finances Handwriting: 50% legible Hobbies: Dancing, watching sports Career/work: Restaurant manager, fast food  MOBILITY STATUS: Uses a cane; shuffling gait  FUNCTIONAL OUTCOME MEASURES: Mam-20 sum score: 53/80, MAM Measure score: 52.4  UPPER EXTREMITY ROM:    Active ROM Right eval Right 11/09/23 Right  01/04/24 Right 03/23/24 Left Eval [facility] overall  Shoulder flexion WFL [phone])   Shoulder abduction 52(100) 60(110) 31(881) 72(110)  Shoulder adduction       Shoulder extension       Shoulder internal rotation       Shoulder external rotation       Elbow flexion [facility] WFL   Elbow extension [facility] WFL   Wrist flexion        Wrist extension [facility] WFL   Wrist ulnar deviation       Wrist radial deviation       Wrist pronation       Wrist supination       (Blank rows = not tested)  UPPER EXTREMITY MMT:     MMT Right eval Right 11/09/23 Right 01/04/24 Right 03/23/24 Left Eval 4-/5 overall Left 11/09/23 4/5 Overall  Shoulder flexion 3/5 3-/5 3-/5 3-/5    Shoulder abduction 2/5 2/5 2+/5 3-/5    Shoulder adduction        Shoulder extension        Shoulder internal rotation        Shoulder external rotation        Middle trapezius        Lower trapezius        Elbow flexion 3/5 3+/5 3+/5 4-/5    Elbow extension 3/5 3+5 4-/5 4-/5    Wrist flexion        Wrist extension 3/5 3/5 3/5 3+/5    Wrist ulnar deviation        Wrist radial deviation        Wrist pronation        Wrist supination        (Blank rows = not tested)  HAND FUNCTION:   Eval: Grip strength: Right: 50 lbs; Left: 34 lbs, Lateral pinch: Right: 17 lbs, Left: 9 lbs, 3 point pinch: Right: 12 lbs, Left: 11 lbs  11/09/23: Grip strength: Right: 60 lbs; Left: 35 lbs, Lateral pinch: Right: 19 lbs, Left: 11 lbs, 3 point pinch: Right: 13 lbs, Left: 9 lbs  01/04/24:Grip strength: Right: 40 lbs; Left: 41 lbs, Lateral pinch: Right: 17 lbs, Left: 8 lbs, 3 point pinch: Right: 9 lbs, Left: 10 lbs  03/23/24:Grip strength: Right: 74 lbs; Left: 55 lbs, Lateral pinch: Right: 15 lbs, Left: 10 lbs, 3 point pinch: Right: 9 lbs, Left: 11 lbs   COORDINATION:  Eval: 9 Hole Peg test: Right: 1 min. & 46 sec; Left: 53 sec  11/09/2023: 9 Hole Peg test: Right: 1 min. & 22 sec; Left: 46 sec  01/04/24: 9 Hole Peg test: Right: 1 min. & 11 sec; Left: 46 sec  03/23/24: 9 Hole Peg test: Right: 1 min.; Left: 46 sec  SENSATION:  Light touch: WFL Proprioception: WFL  EDEMA: N/A  COGNITION: Overall cognitive status: Within functional limits for tasks assessed  VISION:  Does not wear glasses.  VISION ASSESSMENT: To be further assessed in functional  context  PERCEPTION: WFL  PRAXIS: Impaired: Motor planning  TREATMENT DATE: 04/20/24 Therapeutic Activity: -Facilitated R/L FMC/dexterity skills working to place grooved pegs into pegboard.  Min vc for positioning forearms on table top for increased distal stability.  Pt practiced removing pegs 1 by 1, storing up to 2 in hand, and discarding 1 by 1. -Practiced same skills noted above with different shape: Pt practiced scooping 4 [place] stones from a dish and discarding 1 by 1 from palm of hand.  PATIENT EDUCATION: Education details: R/L FMC/dexterity skills Person educated: Patient Education method: Programmer, multimedia, Demonstration, and Verbal cues Education comprehension: needs further education  HOME EXERCISE PROGRAM: Writing tasks, cane stretches in recliner for UB flexibility  GOALS: Goals reviewed with patient? Yes  SHORT TERM GOALS: Target date: 05/04/2024   Pt. Will be independent with HEPs for RUE. Baseline: 03/23/24: Independent, ongoing 01/04/24: Independent 11/09/2023: Independent Eval: No current HEP Goal status:Ongoing  LONG TERM GOALS: Target date: 06/15/2024  1.  Pt. Will increase right shoulder abduction by 10 degrees to  be able to reach up for hair care/grooming. Baseline: 03/23/24: 72(110) Pt. is independent with donning his shirt. However, requires increased time. 03/21/24: Pt. requires increased time to donn his shirt. 01/04/24: 31(881) Pt. reports having donning his shirt is easier with his new routine 11/09/23: Right 60/(110) Eval: Right 52(100) Goal status:  Achieved, revised  (03/23/24) to be able to perform hair care.  2.  Pt. Will increase BUE strength by 2 mm grades to assist with ADLs. Baseline:03/23/24: Right: shoulder flexion: 3-/5, abduction: 3-/5, elbow flexion 4-/5, extension: 4-/5, wrist extension: 3+/5. Eval: Right: shoulder flexion: 3/5,  abduction: 2/5, elbow flexion/extension: 3/5, wrist extension: 3/5. Left: 4-/5 overall  03/21/24: Right: shoulder flexion: 3-/5, abduction: 3-/5, elbow flexion 4-/5, extension: 4-/5, wrist extension: 3/5. Left: 4+/5 overall Eval: Right: shoulder flexion: 3/5, abduction: 2/5, elbow flexion/extension: 3/5, wrist extension: 3/5. Left: 4-/5 overall  01/04/24:  Right: shoulder flexion: 3-/5, abduction: 2/5, elbow flexion 4-/5, extension: 3+/5, wrist extension: 3/5. Left: 4/5 overallEval: Right: shoulder flexion: 3/5, abduction: 2/5, elbow flexion/extension: 3/5, wrist extension: 3/5. Left: 4-/5 overall4/08/25: Right: shoulder flexion: 3-/5, abduction: 2/5, elbow flexion/extension: 3+/5, wrist extension: 3/5. Left: 4/5 overallEval: Right: shoulder flexion: 3/5, abduction: 2/5, elbow flexion/extension: 3/5, wrist extension: 3/5. Left: 4-/5 overall Goal status: Ongoing  3.  Pt. Will perform self-feeding with modified independence Baseline: 03/23/2024: Pt. Requires set-up, and assist to perform scooping action, and to perform hand to mouth patterns. Pt. could benefit from an adaptive utensil.03/21/24: Pt. Requires is improving with hand to mouth patterns,however continues to have difficulty scooping his food, and consistency with hand to mouth patterns. 01/04/24: Pt. reports making improvements with self-feeding. Pt. has difficulty at times with bringing his spoon to his mouth, often bringing an empty spoon to his mouth 11/09/23: Pt. Continues to have difficulty using dominant right hand. Drops food when scooping, Both hands for stabilizing drinks, and uses a straw. Unable to cut food. Eval: Difficulty using dominant right hand. Drops food when scooping, Both hands for stabilizing drinks, and uses a straw. Unable to cut food. Goal status: Ongoing  4.  Pt. Will demonstrate independence with proper A/E techniques/compensatory strategies for ADL/IADLs.  Baseline: 03/23/2024:  Continue 03/21/24: Continue 01/04/24: Continue  ongoing as needed. 11/09/23: Continue Eval: Education to be provided Goal status: Ongoing  5.  Pt. Will write a sentence with 75% legibility with modified independence Baseline: 03/23/24: 1 min. & 52 sec. to complete one sentence with 60% legibility. Pt. Presents with micrographia towards the end of the sentence. 03/21/24: Continue  01/04/24:  One sentence completed in 2 min. & 51 sec. With 50% legibility.11/09/2023: 50% legibility for one sentence. Eval: 50% legibility for one printed sentence Goal status: Ongoing   6.  Pt. Will improve bilateral [facility] skills by 3 sec. on the 9 Hole Peg Test to be able to manipulate small objects.  Baseline: 03/23/24: 9 Hole Peg test: Right: 1 min.; Left: 46 sec. 03/21/24: Pt. Has difficulty efficiently manipulating small objects. Pt. 9 Hole Peg Test: R: 1 min. & 11 sec.  L: 46 sec. 11/09/2023: 9 Hole Peg test: Right: 1 min. & 22 sec; Left: 46 sec Eval: 9 Hole Peg test: Right: 1 min. & 46 sec; Left: 53 sec Goal status: Ongoing  7. Pt. Will improve the Mam-20 score by 5 points to reflect ADL/IADL improvement. Baseline:03/23/2024: TBD; Eval: Mam-20 sum score: 53/80, Mam Measure score: 52.4 Goal status  Ongoing  8. Pt. will improve hand function skills to be able independently, and efficiently pick up, and place items (tv remotes/utensils/pens) into the proper position without using assistance from the left hand.  Baseline: 03/23/2024: Pt. Requires  assist from the left hand to position items within his right hand in preparation for using the item.    Goal status: New   ASSESSMENT:  CLINICAL IMPRESSION: Focus today on R/L FMC/dexterity skills d/t pt reporting difficulty manipulating and repositioning small ADL supplies in hands.  Pt demonstrates ability to reposition grooved pegs in R hand with extra time, occasional use of compensation (stabilizing peg on pegboard), and still requiring extra time on the L, but more efficient than the R hand.  Pt reports that these tasks  require a lot of focus, and often has to stop conversation to perform these [facility] skills.  Pt. continues to benefit from OT services to work on improving overall bilateral UE functioning in order to maximize engagement in, and efficiency in ADLs, and IADL tasks, and provide education about compensatory strategies.    PERFORMANCE DEFICITS: in functional skills including ADLs, IADLs, coordination, dexterity, proprioception, sensation, ROM, strength, pain, Fine motor control, Gross motor control, and UE functional use, cognitive skills including , and psychosocial skills including coping strategies, environmental adaptation, and routines and behaviors.   IMPAIRMENTS: are limiting patient from ADLs, IADLs, and leisure.   CO-MORBIDITIES: may have co-morbidities  that affects occupational performance. Patient will benefit from skilled OT to address above impairments and improve overall function.  MODIFICATION OR ASSISTANCE TO COMPLETE EVALUATION: Min-Moderate modification of tasks or assist with assess necessary to complete an evaluation.  OT OCCUPATIONAL PROFILE AND HISTORY: Detailed assessment: Review of records and additional review of physical, cognitive, psychosocial history related to current functional performance.  CLINICAL DECISION MAKING: Moderate - several treatment options, min-mod task modification necessary  REHAB POTENTIAL: Good  EVALUATION COMPLEXITY: Moderate  PLAN:  OT FREQUENCY: 2x/week  OT DURATION: 12 weeks  PLANNED INTERVENTIONS: 97168 OT Re-evaluation, 97535 self care/ADL training, 02889 therapeutic exercise, 97530 therapeutic activity, 97112 neuromuscular re-education, 97140 manual therapy, 97018 paraffin, 02989 moist heat, 97034 contrast bath, 97760 Orthotics management and training, 02239 Splinting (initial encounter), energy conservation, patient/family education, and DME and/or AE instructions  RECOMMENDED OTHER SERVICES: PT  CONSULTED AND AGREED WITH PLAN OF CARE:  Patient  PLAN FOR NEXT SESSION: see above   Inocente Blazing, MS, OTR/L  04/20/2024, 12:35 PM

## 2024-04-25 ENCOUNTER — Ambulatory Visit: Admitting: Physical Therapy

## 2024-04-25 ENCOUNTER — Ambulatory Visit

## 2024-04-25 DIAGNOSIS — R269 Unspecified abnormalities of gait and mobility: Secondary | ICD-10-CM

## 2024-04-25 DIAGNOSIS — M6281 Muscle weakness (generalized): Secondary | ICD-10-CM

## 2024-04-25 DIAGNOSIS — R278 Other lack of coordination: Secondary | ICD-10-CM

## 2024-04-25 DIAGNOSIS — R262 Difficulty in walking, not elsewhere classified: Secondary | ICD-10-CM

## 2024-04-25 DIAGNOSIS — R2681 Unsteadiness on feet: Secondary | ICD-10-CM

## 2024-04-25 DIAGNOSIS — R531 Weakness: Secondary | ICD-10-CM

## 2024-04-25 NOTE — Therapy (Signed)
 Occupational Therapy Neuro Treatment Note   Patient Name: Daniel Daugherty MRN: [id number] DOB:09/04/1965, 58 y.o., male Today's Date: 04/25/2024  PCP: N/A REFERRING PROVIDER: Odell Tor Edra CINDERELLA, MD  END OF SESSION:  OT End of Session - 04/25/24 1418     Visit Number 38    Number of Visits 48    Date for Recertification  06/15/24    OT Start Time 1145    OT Stop Time 1230    OT Time Calculation (min) 45 min    Activity Tolerance Patient tolerated treatment well    Behavior During Therapy WFL for tasks assessed/performed            Past Medical History:  Diagnosis Date   Hypertension    Past Surgical History:  Procedure Laterality Date   DG HAND LEFT COMPLETE ([facility] HX) Left 04/13/1999   There are no active problems to display for this patient.  ONSET DATE: 05/2021  REFERRING DIAG: Parkinson's Disease  THERAPY DIAG:  Muscle weakness (generalized)  Other lack of coordination  Rationale for Evaluation and Treatment: Rehabilitation  SUBJECTIVE:  SUBJECTIVE STATEMENT: Pt reports that he was able to get out this weekend to watch football with some friends. Pt accompanied by: self  PERTINENT HISTORY:  Pt. is a 58 y.o. male who was diagnosed with Parkinson's Disease in 2022. Pt. reports having had a progressive decline in function since having hernia surgery, and right shoulder rotator cuff surgery. PMHx included: HTN. Of note; Pt. was being followed by [facility], and receiving Home health therapy services. Pt. changed physicians, and was referred for outpatient rehab services here.  PRECAUTIONS: None  WEIGHT BEARING RESTRICTIONS: No  PAIN: 04/25/24: 2-3/10 pain in low back, 1-2 R shoulder Are you having pain? 0/10 pain  FALLS: Has patient fallen in last 6 months? No, 1 small slip near the bed tripped over shoes  LIVING ENVIRONMENT: Lives with: lives alone Lives in: [place],  Stairs: Entry level Has following equipment at home: Quad cane small base, rollator,  shower chair, grab bars, hand rail at [employer]  PLOF: Independent  PATIENT GOALS: Improve self-care  OBJECTIVE:  Note: Objective measures were completed at Evaluation unless otherwise noted.  HAND DOMINANCE: Right  ADLs:  Assist at home:  Ex wife, and children assist several times a week  Transfers/ambulation related to ADLs: Modified Independent Eating: Difficulty using dominant right hand. Drops food when scooping, Both hands for stabilizing drinks, and uses a straw. Unable to cut food.  Grooming: Increased time required for all grooming tasks. UB Dressing: Difficulty managing buttons LB Dressing: Less assist with pants with elastic, more assist with dress pants Toileting: Increased time for hygiene. Bathing: Requires increased time to complete Tub Shower transfers: Modified Independence with increased time  IADLs: Shopping: Son assists with grocery shopping Light housekeeping: Son's assist. Pt. Does light rising off of dishes. Meal Prep: Increased time. Independent with microwave, and air fryer. Difficulty opening bottles, containers Community mobility:  Transportation, light driving to grocery store Medication management:  Son's set-up weekly pillbox, Pt. Is responsible for taking the medications Financial management: Son's assist with monthly finances Handwriting: 50% legible Hobbies: Dancing, watching sports Career/work: Restaurant manager, fast food  MOBILITY STATUS: Uses a cane; shuffling gait  FUNCTIONAL OUTCOME MEASURES: Mam-20 sum score: 53/80, MAM Measure score: 52.4  UPPER EXTREMITY ROM:    Active ROM Right eval Right 11/09/23 Right  01/04/24 Right 03/23/24 Left Eval [facility] overall  Shoulder flexion WFL [phone])   Shoulder abduction 52(100) 60(110)  31(881) 72(110)   Shoulder adduction       Shoulder extension       Shoulder internal rotation       Shoulder external rotation       Elbow flexion [facility] WFL   Elbow extension [facility] WFL    Wrist flexion       Wrist extension [facility] WFL   Wrist ulnar deviation       Wrist radial deviation       Wrist pronation       Wrist supination       (Blank rows = not tested)  UPPER EXTREMITY MMT:     MMT Right eval Right 11/09/23 Right 01/04/24 Right 03/23/24 Left Eval 4-/5 overall Left 11/09/23 4/5 Overall  Shoulder flexion 3/5 3-/5 3-/5 3-/5    Shoulder abduction 2/5 2/5 2+/5 3-/5    Shoulder adduction        Shoulder extension        Shoulder internal rotation        Shoulder external rotation        Middle trapezius        Lower trapezius        Elbow flexion 3/5 3+/5 3+/5 4-/5    Elbow extension 3/5 3+5 4-/5 4-/5    Wrist flexion        Wrist extension 3/5 3/5 3/5 3+/5    Wrist ulnar deviation        Wrist radial deviation        Wrist pronation        Wrist supination        (Blank rows = not tested)  HAND FUNCTION:   Eval: Grip strength: Right: 50 lbs; Left: 34 lbs, Lateral pinch: Right: 17 lbs, Left: 9 lbs, 3 point pinch: Right: 12 lbs, Left: 11 lbs  11/09/23: Grip strength: Right: 60 lbs; Left: 35 lbs, Lateral pinch: Right: 19 lbs, Left: 11 lbs, 3 point pinch: Right: 13 lbs, Left: 9 lbs  01/04/24:Grip strength: Right: 40 lbs; Left: 41 lbs, Lateral pinch: Right: 17 lbs, Left: 8 lbs, 3 point pinch: Right: 9 lbs, Left: 10 lbs  03/23/24:Grip strength: Right: 74 lbs; Left: 55 lbs, Lateral pinch: Right: 15 lbs, Left: 10 lbs, 3 point pinch: Right: 9 lbs, Left: 11 lbs  COORDINATION:  Eval: 9 Hole Peg test: Right: 1 min. & 46 sec; Left: 53 sec  11/09/2023: 9 Hole Peg test: Right: 1 min. & 22 sec; Left: 46 sec  01/04/24: 9 Hole Peg test: Right: 1 min. & 11 sec; Left: 46 sec  03/23/24: 9 Hole Peg test: Right: 1 min.; Left: 46 sec  SENSATION:  Light touch: WFL Proprioception: WFL  EDEMA: N/A  COGNITION: Overall cognitive status: Within functional limits for tasks assessed  VISION:  Does not wear glasses.  VISION ASSESSMENT: To be further assessed in  functional context  PERCEPTION: WFL  PRAXIS: Impaired: Motor planning  TREATMENT DATE: 04/25/24 Therapeutic Exercise: -Wall stretch to promote thoracic ext and shoulder retraction for upright posture; OT providing passive stretch to promote bilat retraction  -Rolled towel placed to middle of thoracic spine to promote erect sitting during passive stretching and activities noted below.  -Passive R/LUE stretching for all shoulder planes, elbow flex/ext, pron/sup, and wrist ext.   -Instructed in self passive wrist ext stretch using wall or edge of table top   Therapeutic Activity:  -Facilitated R hand pinch strengthening and forward reaching patterns using moderate and strong resistant clothespins (green, blue, black), moving pins on/off a vertical dowel with a lateral pinch position.    Self Care: -HEP review; return demo of cane stretches for shoulder flexion, ER to top of head, chest press, and shoulder retraction; min vc for positioning and benefits of performing in varying positions at home (standing/sitting/reclined/supine). -Fall prevention reviewed for any standing exercises   PATIENT EDUCATION: Education details: HEP Person educated: Patient Education method: Programmer, multimedia, Demonstration, and Verbal cues Education comprehension: needs further education  HOME EXERCISE PROGRAM: Writing tasks, cane stretches in recliner for UB flexibility  GOALS: Goals reviewed with patient? Yes  SHORT TERM GOALS: Target date: 05/04/2024   Pt. Will be independent with HEPs for RUE. Baseline: 03/23/24: Independent, ongoing 01/04/24: Independent 11/09/2023: Independent Eval: No current HEP Goal status:Ongoing  LONG TERM GOALS: Target date: 06/15/2024  1.  Pt. Will increase right shoulder abduction by 10 degrees to  be able to reach up for hair care/grooming. Baseline: 03/23/24:  72(110) Pt. is independent with donning his shirt. However, requires increased time. 03/21/24: Pt. requires increased time to donn his shirt. 01/04/24: 31(881) Pt. reports having donning his shirt is easier with his new routine 11/09/23: Right 60/(110) Eval: Right 52(100) Goal status:  Achieved, revised  (03/23/24) to be able to perform hair care.  2.  Pt. Will increase BUE strength by 2 mm grades to assist with ADLs. Baseline:03/23/24: Right: shoulder flexion: 3-/5, abduction: 3-/5, elbow flexion 4-/5, extension: 4-/5, wrist extension: 3+/5. Eval: Right: shoulder flexion: 3/5, abduction: 2/5, elbow flexion/extension: 3/5, wrist extension: 3/5. Left: 4-/5 overall  03/21/24: Right: shoulder flexion: 3-/5, abduction: 3-/5, elbow flexion 4-/5, extension: 4-/5, wrist extension: 3/5. Left: 4+/5 overall Eval: Right: shoulder flexion: 3/5, abduction: 2/5, elbow flexion/extension: 3/5, wrist extension: 3/5. Left: 4-/5 overall  01/04/24:  Right: shoulder flexion: 3-/5, abduction: 2/5, elbow flexion 4-/5, extension: 3+/5, wrist extension: 3/5. Left: 4/5 overallEval: Right: shoulder flexion: 3/5, abduction: 2/5, elbow flexion/extension: 3/5, wrist extension: 3/5. Left: 4-/5 overall4/08/25: Right: shoulder flexion: 3-/5, abduction: 2/5, elbow flexion/extension: 3+/5, wrist extension: 3/5. Left: 4/5 overallEval: Right: shoulder flexion: 3/5, abduction: 2/5, elbow flexion/extension: 3/5, wrist extension: 3/5. Left: 4-/5 overall Goal status: Ongoing  3.  Pt. Will perform self-feeding with modified independence Baseline: 03/23/2024: Pt. Requires set-up, and assist to perform scooping action, and to perform hand to mouth patterns. Pt. could benefit from an adaptive utensil.03/21/24: Pt. Requires is improving with hand to mouth patterns,however continues to have difficulty scooping his food, and consistency with hand to mouth patterns. 01/04/24: Pt. reports making improvements with self-feeding. Pt. has difficulty at times with  bringing his spoon to his mouth, often bringing an empty spoon to his mouth 11/09/23: Pt. Continues to have difficulty using dominant right hand. Drops food when scooping, Both hands for stabilizing drinks, and uses a straw. Unable to cut food. Eval: Difficulty using dominant right hand. Drops food when scooping, Both hands for stabilizing drinks, and uses a straw. Unable to cut  food. Goal status: Ongoing  4.  Pt. Will demonstrate independence with proper A/E techniques/compensatory strategies for ADL/IADLs.  Baseline: 03/23/2024:  Continue 03/21/24: Continue 01/04/24: Continue ongoing as needed. 11/09/23: Continue Eval: Education to be provided Goal status: Ongoing  5.  Pt. Will write a sentence with 75% legibility with modified independence Baseline: 03/23/24: 1 min. & 52 sec. to complete one sentence with 60% legibility. Pt. Presents with micrographia towards the end of the sentence. 03/21/24: Continue 01/04/24:  One sentence completed in 2 min. & 51 sec. With 50% legibility.11/09/2023: 50% legibility for one sentence. Eval: 50% legibility for one printed sentence Goal status: Ongoing   6.  Pt. Will improve bilateral [facility] skills by 3 sec. on the 9 Hole Peg Test to be able to manipulate small objects.  Baseline: 03/23/24: 9 Hole Peg test: Right: 1 min.; Left: 46 sec. 03/21/24: Pt. Has difficulty efficiently manipulating small objects. Pt. 9 Hole Peg Test: R: 1 min. & 11 sec.  L: 46 sec. 11/09/2023: 9 Hole Peg test: Right: 1 min. & 22 sec; Left: 46 sec Eval: 9 Hole Peg test: Right: 1 min. & 46 sec; Left: 53 sec Goal status: Ongoing  7. Pt. Will improve the Mam-20 score by 5 points to reflect ADL/IADL improvement. Baseline:03/23/2024: TBD; Eval: Mam-20 sum score: 53/80, Mam Measure score: 52.4 Goal status  Ongoing  8. Pt. will improve hand function skills to be able independently, and efficiently pick up, and place items (tv remotes/utensils/pens) into the proper position without using assistance from the left  hand.  Baseline: 03/23/2024: Pt. Requires  assist from the left hand to position items within his right hand in preparation for using the item.    Goal status: New  ASSESSMENT: CLINICAL IMPRESSION: Pt with good tolerance to BUE passive stretching as noted above.  Pt reports that he's been using his cane at home to perform bilat shoulder stretches, and finds stretching to be effective to limit general stiffness, stating that he notices a big difference if he doesn't stretch every day.  Pt also reports that his kids and ex-spouse have noticed pt to be standing more erect.  Pt does continue to require vc for standing posture, but fewer cues following thoracic and shoulder stretches noted above.  Focus on pinch stretch this date as pt reports continued difficulties opening bottles and containers.  Pt states that he does use a piece of rubber matting, which helps with opening containers, but it can still be challenging.  Pt. continues to benefit from OT services to work on improving overall bilateral UE functioning in order to maximize engagement in, and efficiency in ADLs, and IADL tasks, and provide education about compensatory strategies.    PERFORMANCE DEFICITS: in functional skills including ADLs, IADLs, coordination, dexterity, proprioception, sensation, ROM, strength, pain, Fine motor control, Gross motor control, and UE functional use, cognitive skills including , and psychosocial skills including coping strategies, environmental adaptation, and routines and behaviors.   IMPAIRMENTS: are limiting patient from ADLs, IADLs, and leisure.   CO-MORBIDITIES: may have co-morbidities  that affects occupational performance. Patient will benefit from skilled OT to address above impairments and improve overall function.  MODIFICATION OR ASSISTANCE TO COMPLETE EVALUATION: Min-Moderate modification of tasks or assist with assess necessary to complete an evaluation.  OT OCCUPATIONAL PROFILE AND HISTORY: Detailed  assessment: Review of records and additional review of physical, cognitive, psychosocial history related to current functional performance.  CLINICAL DECISION MAKING: Moderate - several treatment options, min-mod task modification necessary  REHAB  POTENTIAL: Good  EVALUATION COMPLEXITY: Moderate  PLAN:  OT FREQUENCY: 2x/week  OT DURATION: 12 weeks  PLANNED INTERVENTIONS: 97168 OT Re-evaluation, 97535 self care/ADL training, 02889 therapeutic exercise, 97530 therapeutic activity, 97112 neuromuscular re-education, 97140 manual therapy, 97018 paraffin, 02989 moist heat, 97034 contrast bath, 97760 Orthotics management and training, 02239 Splinting (initial encounter), energy conservation, patient/family education, and DME and/or AE instructions  RECOMMENDED OTHER SERVICES: PT  CONSULTED AND AGREED WITH PLAN OF CARE: Patient  PLAN FOR NEXT SESSION: see above   Inocente Blazing, MS, OTR/L  04/25/2024, 2:20 PM

## 2024-04-25 NOTE — Therapy (Signed)
 OUTPATIENT PHYSICAL THERAPY TREATMENT  Patient Name: Daniel Daugherty MRN: [id number] DOB:May 07, 1966, 58 y.o., male Today's Date: 04/25/2024  PCP: Odell Chard, Edra GRADE, MD  REFERRING PROVIDER: Odell Chard, Edra GRADE, MD   END OF SESSION:    PT End of Session - 04/25/24 1050     Visit Number 38    Number of Visits 48    Date for Recertification  05/23/24    Authorization Type Medicare 2025    Progress Note Due on Visit 40    PT Start Time 1102    PT Stop Time 1142    PT Time Calculation (min) 40 min    Equipment Utilized During Treatment Gait belt    Activity Tolerance Patient tolerated treatment well    Behavior During Therapy WFL for tasks assessed/performed         Past Medical History:  Diagnosis Date   Hypertension    Past Surgical History:  Procedure Laterality Date   DG HAND LEFT COMPLETE ([facility] HX) Left 04/13/1999   There are no active problems to display for this patient.  ONSET DATE: 2022 is when he noticed the change and that was around the same time he was diagnosed with Parkinson's  REFERRING DIAG:  G20.A1 (ICD-10-CM) - Parkinson disease (HCC)  Z74.1 (ICD-10-CM) - Requires daily assistance for activities of daily living (ADL) and comfort needs   THERAPY DIAG:  Generalized weakness  Difficulty in walking, not elsewhere classified  Unsteadiness on feet  Abnormality of gait  Rationale for Evaluation and Treatment: Rehabilitation  SUBJECTIVE:                                                                                                                                                                                             SUBJECTIVE STATEMENT:   Pt reports he is doing well, no significant changes since last session.    PERTINENT HISTORY: Parkinsonism, Vitamin D deficiency, Hyperlipidemia, HTN, poor medication compliance (per chart review)  PAIN:  Are you having pain? Rt shoulder/neck 4-5/10.   PRECAUTIONS: Fall  RED  FLAGS: None   WEIGHT BEARING RESTRICTIONS: No  FALLS: Has patient fallen in last 6 months? Yes. 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   PATIENT GOALS: Get my balance right with my walking, help get my R arm stronger and be able to move it more, improve strength in my legs (specifically the Right)  OBJECTIVE:  Note: Objective measures were completed at Evaluation unless otherwise noted.                                                                                                                             TREATMENT DATE: 04/25/24  Pt ambulates into therapy clinic using cane with close SBA.   Unless otherwise stated, CGA was provided and gait belt donned in order to ensure pt safety throughout session.   TA- To improve functional movements patterns for everyday tasks   Octane - B UE and B LE for priming activation of reciprocal movement patterns and cardiovascular training against the following levels:  Level 1-5 x 8 min   STS standard height chair x10 reps without UE support and VC for technique to scoot out to edge of chair and lean trunk forward for best technique.   Seated basketball touch to floor then chest pass 2 x 10   Seated basketball lift overhead then bounce pass 2 x 10   Forward and retro gait with focus on step length x 8 laps in // bars   High knee march- in // bars with min UE  support- VC for step height -down and back x10- VC to slow down and for step height- Min use of UE for support  Gait no AD 170' working on reciprocal gait and arm swing.  VC's for foot clearance.      PATIENT EDUCATION: Education details: exercise technique, reassessment  Person educated: Patient Education method: Explanation, demo, vc Education comprehension: verbalized understanding and needs further education  HOME EXERCISE PROGRAM:  Access Code: [email or web address] URL: [email or web address] Date: 12/02/2023 Prepared by: Connell Kiss  Exercises - Sidelying Thoracic Rotation with Open Book  - 1 x daily - 7 x weekly - 2 sets - 10 reps - Supine Shoulder External Rotation in Abduction  - 1 x daily - 7 x weekly - 2 sets - 10 reps - 10 seconds hold - Seated Scapular Retraction  - 1 x daily - 7 x weekly - 3 sets - 10 reps - 3 seconds hold - Sit to Stand with Arm Swing  - 1 x daily - 7 x weekly - 2 sets - 10 reps   GOALS: Goals reviewed with patient? Yes  SHORT TERM GOALS: Target date: 04/27/2024    Patient will be independent in home exercise program to improve strength/mobility for better functional independence with ADLs. Baseline: need to initiate 10/07/2023: provided 12/02/2023: updated Goal status: IN PROGRESS   LONG TERM GOALS: Target date: 05/23/2024   1.  Patient (< 68 years old) will complete five times sit to stand test in <  12 seconds indicating an increased LE strength and improved balance. Baseline: 09/28/23: 38.53 seconds 11/09/2023: 22.13 seconds with arms across chest 01/04/2024: 20.12 seconds without UE support 03/21/24: 17.20 seconds without UE support  Goal status: IN PROGRESS   2. Patient will reduce timed up and go to <11 seconds to reduce fall risk and demonstrate improved transfer/gait ability. Baseline: 09/28/23: 19.46 seconds without AD 11/09/2023: 16.96 seconds, no AD, with close SBA for balance safety 01/04/2024: 15.845 seconds, no AD with close  SBA for safety 03/21/24: 14.83 seconds, no AD with close SBA for safety  Goal status: IN PROGRESS  3.  Patient will increase 10 meter walk test to >1.21m/s as to improve gait speed for better community ambulation and to reduce fall risk. Baseline: 09/28/23: 0.35m/s without AD 11/09/2023: 0.89 m/s (11.50 seconds seconds 10.94 seconds) no AD, with close SBA for safety 01/04/2024: 0.935 m/s without AD, CGA/close SBA for safety 03/21/24: 0.8 m/s without AD, CGA/close SBA for safety Goal status: IN PROGRESS  4.  Patient will increase six minute walk test distance to >1060ft for progression to community ambulator and improve gait ability Baseline: 10/04/2023: 570ft (139m at 0.67m/s avg) 11/09/2023: 1061 feet (323 meters, Avg speed 0.897 m/s) without AD Goal status: MET and Advanced, ONGOING with updated goal 11/09/2023 Goal Updated: distance > 1357ft 01/13/2024: 832  ft mostly without SPC but then pt needed SPC due to fatigue, required one break (standing) 03/21/24: 750' with mostly SPC due to fatigue    5. Patient will increase MiniBest Test score to >18/28 to indicate a reduced risk for falling and demonstrate increased independence with functional mobility and ADLs.  Baseline: 10/04/2023: 8/28  11/16/2023: 16/28  01/22/2024: deferred 6/17: 20/28 03/21/24: 17/28  Goal status: Re-instated due to score below goal   6. Patient will increase ABC scale score >80% to demonstrate better functional mobility and better confidence with ADLs.   Baseline: 09/28/23: 17.5% 11/09/2023: 21.25% - pt reports he rated the test as is if he was not using his walker because this is his long term goal  01/04/2024: 35.625% - continuing to rate it as if he is not using his walker 03/21/24: 23% - rates as if he is not using SPC   Goal status: IN PROGRESS   ASSESSMENT:  CLINICAL IMPRESSION:   Pt responded well to the exercises well and was able to make good progress towards goals.  Pt is performed well with the basketball twist/shot  movement in both sitting and standing.  Pt still lacks full upright posture, but was able to achieve greater postural stability when performing the standing forward physioball roll into forward flexion.  Pt will continue to benefit from improved postural stability during the next several weeks.   Pt will continue to benefit from skilled therapy to address remaining deficits in order to improve overall QoL and return to PLOF.    OBJECTIVE IMPAIRMENTS: Abnormal gait, decreased activity tolerance, decreased balance, decreased coordination, decreased endurance, decreased knowledge of condition, decreased knowledge of use of DME, decreased mobility, difficulty walking, decreased ROM, decreased strength, decreased safety awareness, improper body mechanics, postural dysfunction, and pain.   ACTIVITY LIMITATIONS: carrying, lifting, bending, standing, squatting, sleeping, stairs, transfers, bed mobility, continence, bathing, toileting, dressing, reach over head, hygiene/grooming, and locomotion level  PARTICIPATION LIMITATIONS: meal prep, cleaning, laundry, and community activity  PERSONAL FACTORS: Fitness and 3+ comorbidities: Vitamin D deficiency, Hyperlipidemia, and HTN are also affecting patient's functional outcome.   REHAB POTENTIAL: Good  CLINICAL DECISION MAKING: Evolving/moderate complexity  EVALUATION COMPLEXITY: Moderate  PLAN:  PT FREQUENCY: 1-2x/week  PT DURATION: 12 weeks  PLANNED INTERVENTIONS: 97164- PT Re-evaluation, 97110-Therapeutic exercises, 97530- Therapeutic activity, 97112- Neuromuscular re-education, 97535- Self Care, 02859- Manual therapy, [phone]- Gait training, [phone]- Orthotic Fit/training, [phone]- Canalith repositioning, [phone]- Electrical stimulation (manual), Patient/Family education, Balance training, Stair training, Joint mobilization, Vestibular training, Visual/preceptual remediation/compensation, DME instructions, Cryotherapy, and Moist heat  PLAN FOR NEXT SESSION:   Follow-up on interest in joining [facility] to allow him to use Octane device outside of therapy Continue postural strengthening and positioning.  Large amplitude movement. Safety with gait and at home AD.    Note: Portions of this document were prepared using Dragon voice recognition software and although reviewed may contain unintentional dictation errors in syntax, grammar, or spelling.  Lonni KATHEE Gainer PT ,DPT Physical Therapist- [facility]   04/25/24, 2:54 PM

## 2024-04-27 ENCOUNTER — Ambulatory Visit

## 2024-04-27 DIAGNOSIS — R531 Weakness: Secondary | ICD-10-CM

## 2024-04-27 DIAGNOSIS — R278 Other lack of coordination: Secondary | ICD-10-CM

## 2024-04-27 DIAGNOSIS — M6281 Muscle weakness (generalized): Secondary | ICD-10-CM

## 2024-04-27 DIAGNOSIS — R2681 Unsteadiness on feet: Secondary | ICD-10-CM

## 2024-04-27 DIAGNOSIS — R269 Unspecified abnormalities of gait and mobility: Secondary | ICD-10-CM

## 2024-04-27 DIAGNOSIS — R262 Difficulty in walking, not elsewhere classified: Secondary | ICD-10-CM

## 2024-04-27 NOTE — Therapy (Signed)
 OUTPATIENT PHYSICAL THERAPY TREATMENT  Patient Name: Daniel Daugherty MRN: [id number] DOB:October 31, 1965, 58 y.o., male Today's Date: 04/27/2024  PCP: Odell Chard, Edra GRADE, MD  REFERRING PROVIDER: Odell Chard, Edra GRADE, MD   END OF SESSION:    PT End of Session - 04/27/24 1115     Visit Number 39    Number of Visits 48    Date for Recertification  05/23/24    Authorization Type Medicare 2025    Progress Note Due on Visit 40    PT Start Time 1101    PT Stop Time 1141    PT Time Calculation (min) 40 min    Equipment Utilized During Treatment Gait belt    Activity Tolerance Patient tolerated treatment well    Behavior During Therapy WFL for tasks assessed/performed          Past Medical History:  Diagnosis Date   Hypertension    Past Surgical History:  Procedure Laterality Date   DG HAND LEFT COMPLETE ([facility] HX) Left 04/13/1999   There are no active problems to display for this patient.  ONSET DATE: 2022 is when he noticed the change and that was around the same time he was diagnosed with Parkinson's  REFERRING DIAG:  G20.A1 (ICD-10-CM) - Parkinson disease (HCC)  Z74.1 (ICD-10-CM) - Requires daily assistance for activities of daily living (ADL) and comfort needs   THERAPY DIAG:  Generalized weakness  Difficulty in walking, not elsewhere classified  Unsteadiness on feet  Abnormality of gait  Muscle weakness (generalized)  Other lack of coordination  Rationale for Evaluation and Treatment: Rehabilitation  SUBJECTIVE:                                                                                                                                                                                             SUBJECTIVE STATEMENT:   Pt reports his ex- wife was helping him at home walking backward. States doing well today-some stiffness in neck and left shoulder.  PERTINENT HISTORY: Parkinsonism, Vitamin D deficiency, Hyperlipidemia, HTN, poor medication compliance (per  chart review)  PAIN:  Are you having pain? Rt shoulder/neck 4-5/10.   PRECAUTIONS: Fall  RED FLAGS: None   WEIGHT BEARING RESTRICTIONS: No  FALLS: Has patient fallen in last 6 months? Yes. 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   PATIENT GOALS: Get my balance right with my walking, help get my R arm stronger and be able to move it more, improve strength in my legs (specifically the Right)  OBJECTIVE:  Note: Objective measures were completed at Evaluation unless otherwise noted.                                                                                                                             TREATMENT DATE: 04/27/24  Pt ambulates into therapy clinic using cane with close SBA.   Unless otherwise stated, CGA was provided and gait belt donned in order to ensure pt safety throughout session.   TA- To improve functional movements patterns for everyday tasks   Octane - B UE and B LE for priming activation of reciprocal movement patterns and cardiovascular training against the following levels:  Level 1-5 x 8 min   Dynamic - 1 LE on 1st step then dynamic step tap with opp LE up to 3rd step then back to floor x 20 reps each LE  with min UE Support focusing on large amplitude movement.   STS standard height chair + overhead BUE raise + standing calf raises x10 reps without UE support     Neuromuscular Re-Education: neuromuscular reeducation of movement, balance, coordination, kinesthetic sense, posture and proprioception for sitting and/or standing   Activity Description: facing one direction and strategically positioned 5 blaze pods (4 on floor in rectangle 4x6 feet) and 1 pod on steps Activity Setting:  The Blaze Pod Random setting was chosen to enhance cognitive processing and agility, providing an unpredictable environment to simulate real-world scenarios, and fostering quick reactions and adaptability.   Number of Pods:  5  Cycles/Sets:  5 Duration (Time or Hit Count):  2 min timer Patient Stats  Reaction Time:  5, 736 ms and Hits:   11, 14,  15, 14, 16 During rest breaks - would provide feedback including improving trunk rotation to look for pods, side steps, retro steps             PATIENT EDUCATION: Education details: exercise technique, reassessment  Person educated: Patient Education method: Explanation, demo, vc Education comprehension: verbalized understanding and needs further education  HOME EXERCISE PROGRAM:  Access Code: [email or web address] URL: [email or web address] Date: 12/02/2023 Prepared by: Connell Kiss  Exercises - Sidelying Thoracic Rotation with Open Book  - 1 x daily - 7 x weekly - 2 sets - 10 reps - Supine Shoulder External Rotation in Abduction  - 1 x daily - 7 x weekly - 2 sets - 10 reps - 10 seconds hold - Seated Scapular Retraction  - 1 x daily - 7 x weekly - 3 sets - 10 reps - 3 seconds hold - Sit  to Stand with Arm Swing  - 1 x daily - 7 x weekly - 2 sets - 10 reps   GOALS: Goals reviewed with patient? Yes  SHORT TERM GOALS: Target date: 04/27/2024    Patient will be independent in home exercise program to improve strength/mobility for better functional independence with ADLs. Baseline: need to initiate 10/07/2023: provided 12/02/2023: updated Goal status: IN PROGRESS   LONG TERM GOALS: Target date:  05/23/2024   1.  Patient (< 63 years old) will complete five times sit to stand test in < 12 seconds indicating an increased LE strength and improved balance. Baseline: 09/28/23: 38.53 seconds 11/09/2023: 22.13 seconds with arms across chest 01/04/2024: 20.12 seconds without UE support 03/21/24: 17.20 seconds without UE support  Goal status: IN PROGRESS   2. Patient will reduce timed up and go to <11 seconds to reduce fall risk and demonstrate improved transfer/gait ability. Baseline: 09/28/23: 19.46 seconds without AD 11/09/2023: 16.96 seconds, no AD, with close SBA for balance safety 01/04/2024: 15.845 seconds, no AD with close SBA for safety 03/21/24: 14.83 seconds, no AD with close SBA for safety  Goal status: IN PROGRESS  3.  Patient will increase 10 meter walk test to >1.21m/s as to improve gait speed for better community ambulation and to reduce fall risk. Baseline: 09/28/23: 0.35m/s without AD 11/09/2023: 0.89 m/s (11.50 seconds seconds 10.94 seconds) no AD, with close SBA for safety 01/04/2024: 0.935 m/s without AD, CGA/close SBA for safety 03/21/24: 0.8 m/s without AD, CGA/close SBA for safety Goal status: IN PROGRESS  4.  Patient will increase six minute walk test distance to >1068ft for progression to community ambulator and improve gait ability Baseline: 10/04/2023: 554ft (174m at 0.50m/s avg) 11/09/2023: 1061 feet (323 meters, Avg speed 0.897 m/s) without AD Goal status: MET and Advanced, ONGOING with updated goal 11/09/2023 Goal Updated: distance > 1362ft 01/13/2024: 832  ft mostly without SPC but then pt needed SPC due to fatigue, required one break (standing) 03/21/24: 750' with mostly SPC due to fatigue    5. Patient will increase MiniBest Test score to >18/28 to indicate a reduced risk for falling and demonstrate increased independence with functional mobility and ADLs.  Baseline: 10/04/2023: 8/28  11/16/2023: 16/28  01/22/2024: deferred 6/17: 20/28 03/21/24: 17/28  Goal status: Re-instated  due to score below goal   6. Patient will increase ABC scale score >80% to demonstrate better functional mobility and better confidence with ADLs.   Baseline: 09/28/23: 17.5% 11/09/2023: 21.25% - pt reports he rated the test as is if he was not using his walker because this is his long term goal  01/04/2024: 35.625% - continuing to rate it as if he is not using his walker 03/21/24: 23% - rates as if he is not using SPC   Goal status: IN PROGRESS   ASSESSMENT:  CLINICAL IMPRESSION:   Patient presents with good effort today- able to swing his LE high from floor to 3rd step without hesitation and later- improving reaction time and overall mobility- trunk turning, postural control, retro steps, speed of mobility. He adapted well to blaze pod task-  improving with number of hits from initial trial.  Pt will continue to benefit from skilled therapy to address remaining deficits in order to improve overall QoL and return to PLOF.    OBJECTIVE IMPAIRMENTS: Abnormal gait, decreased activity tolerance, decreased balance, decreased coordination, decreased endurance, decreased knowledge of condition, decreased knowledge of use of DME, decreased mobility, difficulty walking, decreased ROM, decreased strength, decreased  safety awareness, improper body mechanics, postural dysfunction, and pain.   ACTIVITY LIMITATIONS: carrying, lifting, bending, standing, squatting, sleeping, stairs, transfers, bed mobility, continence, bathing, toileting, dressing, reach over head, hygiene/grooming, and locomotion level  PARTICIPATION LIMITATIONS: meal prep, cleaning, laundry, and community activity  PERSONAL FACTORS: Fitness and 3+ comorbidities: Vitamin D deficiency, Hyperlipidemia, and HTN are also affecting patient's functional outcome.   REHAB POTENTIAL: Good  CLINICAL DECISION MAKING: Evolving/moderate complexity  EVALUATION COMPLEXITY: Moderate  PLAN:  PT FREQUENCY: 1-2x/week  PT DURATION: 12 weeks  PLANNED  INTERVENTIONS: 97164- PT Re-evaluation, 97110-Therapeutic exercises, 97530- Therapeutic activity, 97112- Neuromuscular re-education, 97535- Self Care, 02859- Manual therapy, [phone]- Gait training, [phone]- Orthotic Fit/training, [phone]- Canalith repositioning, [phone]- Electrical stimulation (manual), Patient/Family education, Balance training, Stair training, Joint mobilization, Vestibular training, Visual/preceptual remediation/compensation, DME instructions, Cryotherapy, and Moist heat  PLAN FOR NEXT SESSION:  Follow-up on interest in joining [facility] to allow him to use Octane device outside of therapy Continue postural strengthening and positioning.  Large amplitude movement. Safety with gait and at home AD.     Reyes LOISE London PT  Physical Therapist- [facility]   04/27/24, 1:16 PM

## 2024-04-28 NOTE — Therapy (Signed)
 Occupational Therapy Outpatient Neuro Treatment Note   Patient Name: Daniel Daugherty MRN: [id number] DOB:1965-09-02, 58 y.o., male Today's Date: 04/28/2024  PCP: N/A REFERRING PROVIDER: Odell Tor Edra CINDERELLA, MD  END OF SESSION:  OT End of Session - 04/28/24 0953     Visit Number [id number]    Number of Visits 48    Date for Recertification  06/15/24    OT Start Time 1145    OT Stop Time 1230    OT Time Calculation (min) 45 min    Activity Tolerance Patient tolerated treatment well    Behavior During Therapy [facility] for tasks assessed/performed            Past Medical History:  Diagnosis Date   Hypertension    Past Surgical History:  Procedure Laterality Date   DG HAND LEFT COMPLETE ([facility] HX) Left 04/13/1999   There are no active problems to display for this patient.  ONSET DATE: 05/2021  REFERRING DIAG: Parkinson's Disease  THERAPY DIAG:  Muscle weakness (generalized)  Other lack of coordination  Rationale for Evaluation and Treatment: Rehabilitation  SUBJECTIVE:  SUBJECTIVE STATEMENT: Pt reports doing well today. Pt accompanied by: self  PERTINENT HISTORY:  Pt. is a 58 y.o. male who was diagnosed with Parkinson's Disease in 2022. Pt. reports having had a progressive decline in function since having hernia surgery, and right shoulder rotator cuff surgery. PMHx included: HTN. Of note; Pt. was being followed by [facility], and receiving Home health therapy services. Pt. changed physicians, and was referred for outpatient rehab services here.  PRECAUTIONS: None  WEIGHT BEARING RESTRICTIONS: No  PAIN: 04/27/24: 2-3/10 pain in low back, 1-2 R shoulder Are you having pain? 0/10 pain  FALLS: Has patient fallen in last 6 months? No, 1 small slip near the bed tripped over shoes  LIVING ENVIRONMENT: Lives with: lives alone Lives in: [place],  Stairs: Entry level Has following equipment at home: Quad cane small base, rollator, shower chair, grab bars, hand rail at  [employer]  PLOF: Independent  PATIENT GOALS: Improve self-care  OBJECTIVE:  Note: Objective measures were completed at Evaluation unless otherwise noted.  HAND DOMINANCE: Right  ADLs:  Assist at home:  Ex wife, and children assist several times a week  Transfers/ambulation related to ADLs: Modified Independent Eating: Difficulty using dominant right hand. Drops food when scooping, Both hands for stabilizing drinks, and uses a straw. Unable to cut food.  Grooming: Increased time required for all grooming tasks. UB Dressing: Difficulty managing buttons LB Dressing: Less assist with pants with elastic, more assist with dress pants Toileting: Increased time for hygiene. Bathing: Requires increased time to complete Tub Shower transfers: Modified Independence with increased time  IADLs: Shopping: Son assists with grocery shopping Light housekeeping: Son's assist. Pt. Does light rising off of dishes. Meal Prep: Increased time. Independent with microwave, and air fryer. Difficulty opening bottles, containers Community mobility:  Transportation, light driving to grocery store Medication management:  Son's set-up weekly pillbox, Pt. Is responsible for taking the medications Financial management: Son's assist with monthly finances Handwriting: 50% legible Hobbies: Dancing, watching sports Career/work: Restaurant manager, fast food  MOBILITY STATUS: Uses a cane; shuffling gait  FUNCTIONAL OUTCOME MEASURES: Mam-20 sum score: 53/80, MAM Measure score: 52.4  UPPER EXTREMITY ROM:    Active ROM Right eval Right 11/09/23 Right  01/04/24 Right 03/23/24 Left Eval [facility] overall  Shoulder flexion WFL [phone])   Shoulder abduction 52(100) 60(110) 31(881) 72(110)   Shoulder adduction  Shoulder extension       Shoulder internal rotation       Shoulder external rotation       Elbow flexion [facility] WFL   Elbow extension [facility] WFL   Wrist flexion       Wrist extension  [facility] WFL   Wrist ulnar deviation       Wrist radial deviation       Wrist pronation       Wrist supination       (Blank rows = not tested)  UPPER EXTREMITY MMT:     MMT Right eval Right 11/09/23 Right 01/04/24 Right 03/23/24 Left Eval 4-/5 overall Left 11/09/23 4/5 Overall  Shoulder flexion 3/5 3-/5 3-/5 3-/5    Shoulder abduction 2/5 2/5 2+/5 3-/5    Shoulder adduction        Shoulder extension        Shoulder internal rotation        Shoulder external rotation        Middle trapezius        Lower trapezius        Elbow flexion 3/5 3+/5 3+/5 4-/5    Elbow extension 3/5 3+5 4-/5 4-/5    Wrist flexion        Wrist extension 3/5 3/5 3/5 3+/5    Wrist ulnar deviation        Wrist radial deviation        Wrist pronation        Wrist supination        (Blank rows = not tested)  HAND FUNCTION:   Eval: Grip strength: Right: 50 lbs; Left: 34 lbs, Lateral pinch: Right: 17 lbs, Left: 9 lbs, 3 point pinch: Right: 12 lbs, Left: 11 lbs  11/09/23: Grip strength: Right: 60 lbs; Left: 35 lbs, Lateral pinch: Right: 19 lbs, Left: 11 lbs, 3 point pinch: Right: 13 lbs, Left: 9 lbs  01/04/24:Grip strength: Right: 40 lbs; Left: 41 lbs, Lateral pinch: Right: 17 lbs, Left: 8 lbs, 3 point pinch: Right: 9 lbs, Left: 10 lbs  03/23/24:Grip strength: Right: 74 lbs; Left: 55 lbs, Lateral pinch: Right: 15 lbs, Left: 10 lbs, 3 point pinch: Right: 9 lbs, Left: 11 lbs  COORDINATION:  Eval: 9 Hole Peg test: Right: 1 min. & 46 sec; Left: 53 sec  11/09/2023: 9 Hole Peg test: Right: 1 min. & 22 sec; Left: 46 sec  01/04/24: 9 Hole Peg test: Right: 1 min. & 11 sec; Left: 46 sec  03/23/24: 9 Hole Peg test: Right: 1 min.; Left: 46 sec  SENSATION:  Light touch: WFL Proprioception: WFL  EDEMA: N/A  COGNITION: Overall cognitive status: Within functional limits for tasks assessed  VISION:  Does not wear glasses.  VISION ASSESSMENT: To be further assessed in functional context  PERCEPTION:  WFL  PRAXIS: Impaired: Motor planning  TREATMENT DATE: 04/27/24 Therapeutic Exercise: -Wall stretch to promote thoracic ext and shoulder retraction for upright posture; OT providing passive stretch to promote bilat retraction  -Rolled towel placed to middle of thoracic spine to promote erect sitting during activities noted below.  Additional bilat passive shoulder retraction stretch with towel in place  Neuro re-ed: -Timed trials with [place]  discs, working with 20 discs, towel beneath discs and grid on table top to reduce sliding/ease pick up   -RUE: Placing discs in/out of grid (no flip) Trial 1: 1 min 49 sec, Trial 2: 1 min 27 sec, Trial 3: 1 min 22 sec  -LUE: Placing discs in/out of grid (no flip) Trial 1: 1 min 22 sec     -RUE: Placing discs in grid flipping black to red/removing discs flipping red to black: Trial 1: 4 min 26 sec   -Rest breaks between trials, and vc provided for maintaining focus on speed throughout   Self Care: -Review of handwriting strategies to increase legibility/minimize micrographia with subsequent implementation of strategies to print first/last name and write 1 short sentence x2 repetitions of same sentence with vc and visual targets to increase word and letter spacing toward end of sentence.  PATIENT EDUCATION: Education details: Adult nurse Person educated: Patient Education method: Explanation, Demonstration, and Verbal cues Education comprehension: needs further education  HOME EXERCISE PROGRAM: Writing tasks, cane stretches in recliner for UB flexibility  GOALS: Goals reviewed with patient? Yes  SHORT TERM GOALS: Target date: 05/04/2024   Pt. Will be independent with HEPs for RUE. Baseline: 03/23/24: Independent, ongoing 01/04/24: Independent 11/09/2023: Independent Eval: No current HEP Goal status:Ongoing  LONG  TERM GOALS: Target date: 06/15/2024  1.  Pt. Will increase right shoulder abduction by 10 degrees to  be able to reach up for hair care/grooming. Baseline: 03/23/24: 72(110) Pt. is independent with donning his shirt. However, requires increased time. 03/21/24: Pt. requires increased time to donn his shirt. 01/04/24: 31(881) Pt. reports having donning his shirt is easier with his new routine 11/09/23: Right 60/(110) Eval: Right 52(100) Goal status:  Achieved, revised  (03/23/24) to be able to perform hair care.  2.  Pt. Will increase BUE strength by 2 mm grades to assist with ADLs. Baseline:03/23/24: Right: shoulder flexion: 3-/5, abduction: 3-/5, elbow flexion 4-/5, extension: 4-/5, wrist extension: 3+/5. Eval: Right: shoulder flexion: 3/5, abduction: 2/5, elbow flexion/extension: 3/5, wrist extension: 3/5. Left: 4-/5 overall  03/21/24: Right: shoulder flexion: 3-/5, abduction: 3-/5, elbow flexion 4-/5, extension: 4-/5, wrist extension: 3/5. Left: 4+/5 overall Eval: Right: shoulder flexion: 3/5, abduction: 2/5, elbow flexion/extension: 3/5, wrist extension: 3/5. Left: 4-/5 overall  01/04/24:  Right: shoulder flexion: 3-/5, abduction: 2/5, elbow flexion 4-/5, extension: 3+/5, wrist extension: 3/5. Left: 4/5 overallEval: Right: shoulder flexion: 3/5, abduction: 2/5, elbow flexion/extension: 3/5, wrist extension: 3/5. Left: 4-/5 overall4/08/25: Right: shoulder flexion: 3-/5, abduction: 2/5, elbow flexion/extension: 3+/5, wrist extension: 3/5. Left: 4/5 overallEval: Right: shoulder flexion: 3/5, abduction: 2/5, elbow flexion/extension: 3/5, wrist extension: 3/5. Left: 4-/5 overall Goal status: Ongoing  3.  Pt. Will perform self-feeding with modified independence Baseline: 03/23/2024: Pt. Requires set-up, and assist to perform scooping action, and to perform hand to mouth patterns. Pt. could benefit from an adaptive utensil.03/21/24: Pt. Requires is improving with hand to mouth patterns,however continues to have  difficulty scooping his food, and consistency with hand to mouth patterns. 01/04/24: Pt. reports making improvements with self-feeding. Pt. has difficulty at times with bringing his spoon to his mouth, often bringing an empty spoon to  his mouth 11/09/23: Pt. Continues to have difficulty using dominant right hand. Drops food when scooping, Both hands for stabilizing drinks, and uses a straw. Unable to cut food. Eval: Difficulty using dominant right hand. Drops food when scooping, Both hands for stabilizing drinks, and uses a straw. Unable to cut food. Goal status: Ongoing  4.  Pt. Will demonstrate independence with proper A/E techniques/compensatory strategies for ADL/IADLs.  Baseline: 03/23/2024:  Continue 03/21/24: Continue 01/04/24: Continue ongoing as needed. 11/09/23: Continue Eval: Education to be provided Goal status: Ongoing  5.  Pt. Will write a sentence with 75% legibility with modified independence Baseline: 03/23/24: 1 min. & 52 sec. to complete one sentence with 60% legibility. Pt. Presents with micrographia towards the end of the sentence. 03/21/24: Continue 01/04/24:  One sentence completed in 2 min. & 51 sec. With 50% legibility.11/09/2023: 50% legibility for one sentence. Eval: 50% legibility for one printed sentence Goal status: Ongoing   6.  Pt. Will improve bilateral [facility] skills by 3 sec. on the 9 Hole Peg Test to be able to manipulate small objects.  Baseline: 03/23/24: 9 Hole Peg test: Right: 1 min.; Left: 46 sec. 03/21/24: Pt. Has difficulty efficiently manipulating small objects. Pt. 9 Hole Peg Test: R: 1 min. & 11 sec.  L: 46 sec. 11/09/2023: 9 Hole Peg test: Right: 1 min. & 22 sec; Left: 46 sec Eval: 9 Hole Peg test: Right: 1 min. & 46 sec; Left: 53 sec Goal status: Ongoing  7. Pt. Will improve the Mam-20 score by 5 points to reflect ADL/IADL improvement. Baseline:03/23/2024: TBD; Eval: Mam-20 sum score: 53/80, Mam Measure score: 52.4 Goal status  Ongoing  8. Pt. will improve hand  function skills to be able independently, and efficiently pick up, and place items (tv remotes/utensils/pens) into the proper position without using assistance from the left hand.  Baseline: 03/23/2024: Pt. Requires  assist from the left hand to position items within his right hand in preparation for using the item.    Goal status: New  ASSESSMENT: CLINICAL IMPRESSION: Pt with good tolerance to BUE passive stretching as noted above.  Pt able to improve speed on all repeat trials with [place]  discs using RUE; R dominant arm remains more impacted with bradykinesia than LUE.  By 3rd trial of [place]  discs on the RUE, pt verbalized feeling like he was slowing down and feeling more stiff in his shoulders, though speed was still improved from first trial.  Able to return demo of bilat shoulder retraction stretch between trials of [place]  discs in order to counteract protraction and kyphotic posture when working on table top activities.   Lumbar support with rolled towel also helping to promote more erect sitting posture.  Pt able to recall 2 or 3 handwriting strategies today to increase legibility/minimize micrographia, though pt does continue to require vc to implement these strategies while actually writing.  Pt. continues to benefit from OT services to work on improving overall bilateral UE functioning in order to maximize engagement in, and efficiency in ADLs, and IADL tasks, and provide education about compensatory strategies.    PERFORMANCE DEFICITS: in functional skills including ADLs, IADLs, coordination, dexterity, proprioception, sensation, ROM, strength, pain, Fine motor control, Gross motor control, and UE functional use, cognitive skills including , and psychosocial skills including coping strategies, environmental adaptation, and routines and behaviors.   IMPAIRMENTS: are limiting patient from ADLs, IADLs, and leisure.   CO-MORBIDITIES: may have co-morbidities  that affects occupational  performance. Patient will benefit from skilled OT  to address above impairments and improve overall function.  MODIFICATION OR ASSISTANCE TO COMPLETE EVALUATION: Min-Moderate modification of tasks or assist with assess necessary to complete an evaluation.  OT OCCUPATIONAL PROFILE AND HISTORY: Detailed assessment: Review of records and additional review of physical, cognitive, psychosocial history related to current functional performance.  CLINICAL DECISION MAKING: Moderate - several treatment options, min-mod task modification necessary  REHAB POTENTIAL: Good  EVALUATION COMPLEXITY: Moderate  PLAN:  OT FREQUENCY: 2x/week  OT DURATION: 12 weeks  PLANNED INTERVENTIONS: 97168 OT Re-evaluation, 97535 self care/ADL training, 02889 therapeutic exercise, 97530 therapeutic activity, 97112 neuromuscular re-education, 97140 manual therapy, 97018 paraffin, 02989 moist heat, 97034 contrast bath, 97760 Orthotics management and training, 02239 Splinting (initial encounter), energy conservation, patient/family education, and DME and/or AE instructions  RECOMMENDED OTHER SERVICES: PT  CONSULTED AND AGREED WITH PLAN OF CARE: Patient  PLAN FOR NEXT SESSION: see above   Inocente Blazing, MS, OTR/L  04/28/2024, 9:56 AM

## 2024-05-02 ENCOUNTER — Ambulatory Visit

## 2024-05-02 ENCOUNTER — Ambulatory Visit: Admitting: Physical Therapy

## 2024-05-02 DIAGNOSIS — M6281 Muscle weakness (generalized): Secondary | ICD-10-CM | POA: Diagnosis not present

## 2024-05-02 DIAGNOSIS — R2681 Unsteadiness on feet: Secondary | ICD-10-CM

## 2024-05-02 DIAGNOSIS — R269 Unspecified abnormalities of gait and mobility: Secondary | ICD-10-CM

## 2024-05-02 DIAGNOSIS — R262 Difficulty in walking, not elsewhere classified: Secondary | ICD-10-CM

## 2024-05-02 DIAGNOSIS — R278 Other lack of coordination: Secondary | ICD-10-CM

## 2024-05-02 DIAGNOSIS — R531 Weakness: Secondary | ICD-10-CM

## 2024-05-02 NOTE — Therapy (Signed)
 Occupational Therapy Outpatient Neuro Discharge Note  Patient Name: Daniel Daugherty MRN: [id number] DOB:[birth year], 58 y.o., male Today's Date: 05/02/2024  PCP: N/A REFERRING PROVIDER: Odell Tor Edra CINDERELLA, MD  END OF SESSION:  OT End of Session - 05/02/24 1144     Visit Number 40    Number of Visits 48    Date for Recertification  06/15/24    Authorization Type Reporting period beginning 03/21/24-05/02/24    OT Start Time 1145    OT Stop Time 1230    OT Time Calculation (min) 45 min    Activity Tolerance Patient tolerated treatment well    Behavior During Therapy [facility] for tasks assessed/performed            Past Medical History:  Diagnosis Date   Hypertension    Past Surgical History:  Procedure Laterality Date   DG HAND LEFT COMPLETE ([facility] HX) Left 04/13/1999   There are no active problems to display for this patient.  ONSET DATE: 05/2021  REFERRING DIAG: Parkinson's Disease  THERAPY DIAG:  Muscle weakness (generalized)  Other lack of coordination  Rationale for Evaluation and Treatment: Rehabilitation  SUBJECTIVE:  SUBJECTIVE STATEMENT: Pt reports he's ready to change things up and is planning to get back to working out at [employer]. Pt accompanied by: self  PERTINENT HISTORY:  Pt. is a 58 y.o. male who was diagnosed with Parkinson's Disease in 2022. Pt. reports having had a progressive decline in function since having hernia surgery, and right shoulder rotator cuff surgery. PMHx included: HTN. Of note; Pt. was being followed by [facility], and receiving Home health therapy services. Pt. changed physicians, and was referred for outpatient rehab services here.  PRECAUTIONS: None  WEIGHT BEARING RESTRICTIONS: No  PAIN:  05/02/24: Pt denies pain but states he's a little sore in his lower back and neck Are you having pain? 0/10 pain  FALLS: Has patient fallen in last 6 months? No, 1 small slip near the bed tripped over shoes  LIVING ENVIRONMENT: Lives with: lives  alone Lives in: [place],  Stairs: Entry level Has following equipment at home: Quad cane small base, rollator, shower chair, grab bars, hand rail at [employer]  PLOF: Independent  PATIENT GOALS: Improve self-care  OBJECTIVE:  Note: Objective measures were completed at Evaluation unless otherwise noted.  HAND DOMINANCE: Right  ADLs:  Assist at home:  Ex wife, and children assist several times a week  Transfers/ambulation related to ADLs: Modified Independent Eating: Difficulty using dominant right hand. Drops food when scooping, Both hands for stabilizing drinks, and uses a straw. Unable to cut food.  Grooming: Increased time required for all grooming tasks. UB Dressing: Difficulty managing buttons LB Dressing: Less assist with pants with elastic, more assist with dress pants Toileting: Increased time for hygiene. Bathing: Requires increased time to complete Tub Shower transfers: Modified Independence with increased time  IADLs: Shopping: Son assists with grocery shopping Light housekeeping: Son's assist. Pt. Does light rising off of dishes. Meal Prep: Increased time. Independent with microwave, and air fryer. Difficulty opening bottles, containers Community mobility:  Transportation, light driving to grocery store Medication management:  Son's set-up weekly pillbox, Pt. Is responsible for taking the medications Financial management: Son's assist with monthly finances Handwriting: 50% legible Hobbies: Dancing, watching sports Career/work: Restaurant manager, fast food  MOBILITY STATUS: Uses a cane; shuffling gait  FUNCTIONAL OUTCOME MEASURES: Mam-20 sum score: 53/80, MAM Measure score: 52.4 05/02/24: 59/80  UPPER EXTREMITY ROM:    Active ROM Left Eval WFL Right  eval Right 11/09/23 Right  01/04/24 Right 03/23/24 Right 05/02/24  Shoulder flexion  WFL 120(134) 873(862) 128(137) 125 (150)  Shoulder abduction  52(100) 60(110) 68(118) 72(110) 152 (170)  Shoulder adduction         Shoulder extension        Shoulder internal rotation        Shoulder external rotation        Elbow flexion  [facility] WFL   Elbow extension  [facility] WFL   Wrist flexion        Wrist extension  [facility] WFL   Wrist ulnar deviation        Wrist radial deviation        Wrist pronation        Wrist supination        (Blank rows = not tested)  UPPER EXTREMITY MMT:     MMT Right eval Right 11/09/23 Right 01/04/24 Right 03/23/24 Right 05/02/24 Left Eval 4-/5 overall Left 11/09/23 4/5 Overall  Shoulder flexion 3/5 3-/5 3-/5 3-/5 3-/5    Shoulder abduction 2/5 2/5 2+/5 3-/5 3-/5    Shoulder adduction         Shoulder extension         Shoulder internal rotation         Shoulder external rotation         Middle trapezius         Lower trapezius         Elbow flexion 3/5 3+/5 3+/5 4-/5 4+/5    Elbow extension 3/5 3+5 4-/5 4-/5 4/5    Wrist flexion         Wrist extension 3/5 3/5 3/5 3+/5 3+    Wrist ulnar deviation         Wrist radial deviation         Wrist pronation         Wrist supination         (Blank rows = not tested)  HAND FUNCTION:   Eval: Grip strength: Right: 50 lbs; Left: 34 lbs, Lateral pinch: Right: 17 lbs, Left: 9 lbs, 3 point pinch: Right: 12 lbs, Left: 11 lbs  11/09/23: Grip strength: Right: 60 lbs; Left: 35 lbs, Lateral pinch: Right: 19 lbs, Left: 11 lbs, 3 point pinch: Right: 13 lbs, Left: 9 lbs  01/04/24:Grip strength: Right: 40 lbs; Left: 41 lbs, Lateral pinch: Right: 17 lbs, Left: 8 lbs, 3 point pinch: Right: 9 lbs, Left: 10 lbs  03/23/24:Grip strength: Right: 74 lbs; Left: 55 lbs, Lateral pinch: Right: 15 lbs, Left: 10 lbs, 3 point pinch: Right: 9 lbs, Left: 11 lbs  05/02/24: Grip strength: Right: 82 lbs, Left: 44 lbs; Lateral pinch: Right: 18 lbs, Left: 7 lbs, 3 point pinch: Right: 12 lbs, Left: 12 lbs,   COORDINATION:  Eval: 9 Hole Peg test: Right: 1 min. & 46 sec; Left: 53 sec  11/09/2023: 9 Hole Peg test: Right: 1 min. & 22 sec; Left: 46  sec  01/04/24: 9 Hole Peg test: Right: 1 min. & 11 sec; Left: 46 sec  03/23/24: 9 Hole Peg test: Right: 1 min.; Left: 46 sec  05/02/24: 9 Hole Peg test: Right:  54 sec; Left: 43 sec   SENSATION:  Light touch: WFL Proprioception: WFL  EDEMA: N/A  COGNITION: Overall cognitive status: Within functional limits for tasks assessed  VISION:  Does not wear glasses.  VISION ASSESSMENT: To be further assessed in functional context  PERCEPTION: [facility]  PRAXIS: Impaired: Motor planning                                                                                                                         TREATMENT DATE: 05/02/24 Therapeutic Activity: -Objective measures taken and goals updated for discharge summary.  Self Care: -HEP review, with reinforcement on daily practice with writing, stretching, coordination to optimize BUE function.  PATIENT EDUCATION: Education details: Progress towards goals/d/c recommendations Person educated: Patient Education method: Explanation, Demonstration, and Verbal cues Education comprehension: needs further education  HOME EXERCISE PROGRAM: Writing tasks, cane stretches in recliner for UB flexibility  GOALS: Goals reviewed with patient? Yes  SHORT TERM GOALS: Target date: 05/04/2024   Pt. Will be independent with HEPs for RUE. Baseline: 03/23/24: Independent, ongoing 01/04/24: Independent 11/09/2023: Independent Eval: No current HEP; 05/02/24: Pt  uses putty and a stress ball for hand strengthening, and uses a red theraband for Bue strengthening.  Pt also uses cane for bilat shoulder AAROM. Goal status: achieved  LONG TERM GOALS: Target date: 06/15/2024  1.  Pt. Will increase right shoulder abduction by 10 degrees to  be able to reach up for hair care/grooming. Baseline: 03/23/24: 72(110) Pt. is independent with donning his shirt. However, requires increased time. 03/21/24: Pt. requires increased time to donn his shirt. 01/04/24: 31(881) Pt. reports  having donning his shirt is easier with his new routine 11/09/23: Right 60/(110) Eval: Right 52(100); 05/02/24: Pt sees a barber for shaving his head, and son assists with shaving; R shoulder abd 152 (170) Goal status: Improved/d/c  2.  Pt. Will increase BUE strength by 2 mm grades to assist with ADLs. Baseline:03/23/24: Right: shoulder flexion: 3-/5, abduction: 3-/5, elbow flexion 4-/5, extension: 4-/5, wrist extension: 3+/5. Eval: Right: shoulder flexion: 3/5, abduction: 2/5, elbow flexion/extension: 3/5, wrist extension: 3/5. Left: 4-/5 overall  03/21/24: Right: shoulder flexion: 3-/5, abduction: 3-/5, elbow flexion 4-/5, extension: 4-/5, wrist extension: 3/5. Left: 4+/5 overall Eval: Right: shoulder flexion: 3/5, abduction: 2/5, elbow flexion/extension: 3/5, wrist extension: 3/5. Left: 4-/5 overall  01/04/24:  Right: shoulder flexion: 3-/5, abduction: 2/5, elbow flexion 4-/5, extension: 3+/5, wrist extension: 3/5. Left: 4/5 overallEval: Right: shoulder flexion: 3/5, abduction: 2/5, elbow flexion/extension: 3/5, wrist extension: 3/5. Left: 4-/5 overall4/08/25: Right: shoulder flexion: 3-/5, abduction: 2/5, elbow flexion/extension: 3+/5, wrist extension: 3/5. Left: 4/5 overallEval: Right: shoulder flexion: 3/5, abduction: 2/5, elbow flexion/extension: 3/5, wrist extension: 3/5. Left: 4-/5 overall; 05/02/24: R shoulder flex/abd 3-, elbow flex 4+, elbow ext 4, wrist ext 3+  Goal status: Improved/d/c  3.  Pt. Will perform self-feeding with modified independence Baseline: 03/23/2024: Pt. Requires set-up, and assist to perform scooping action, and to perform hand to mouth patterns. Pt. could benefit from an adaptive utensil.03/21/24: Pt. Requires is improving with hand to mouth patterns,however continues to have difficulty scooping his food, and consistency with hand to mouth patterns. 01/04/24: Pt. reports making improvements with self-feeding. Pt. has difficulty at times with bringing his spoon to his mouth, often  bringing an empty spoon to his mouth 11/09/23: Pt. Continues to have difficulty using dominant right hand. Drops food when scooping, Both hands for stabilizing drinks, and uses a straw. Unable to cut food. Eval: Difficulty using dominant right hand. Drops food when scooping, Both hands for stabilizing drinks, and uses a straw. Unable to cut food; 05/02/24: Pt reports that he can use eating utensils in his R hand, but with difficulty, and often makes a mess. Goal status: d/c  4.  Pt. Will demonstrate independence with proper A/E techniques/compensatory strategies for ADL/IADLs.  Baseline: 03/23/2024:  Continue 03/21/24: Continue 01/04/24: Continue ongoing as needed. 11/09/23: Continue Eval: Education to be provided; 05/02/24: Pt has been educated on AE for self feeding and understands options to obtain if desired. Goal status: d/c  5.  Pt. Will write a sentence with 75% legibility with modified independence Baseline: 03/23/24: 1 min. & 52 sec. to complete one sentence with 60% legibility. Pt. Presents with micrographia towards the end of the sentence. 03/21/24: Continue 01/04/24:  One sentence completed in 2 min. & 51 sec. With 50% legibility.11/09/2023: 50% legibility for one sentence. Eval: 50% legibility for one printed sentence; 05/02/24: 100% legibility with increased time, built up grip, and weighted pen; micrographia minimal today, but typically present toward the end of first sentence written  Goal status: achieved/d/c   6.  Pt. Will improve bilateral [facility] skills by 3 sec. on the 9 Hole Peg Test to be able to manipulate small objects.  Baseline: 03/23/24: 9 Hole Peg test: Right: 1 min.; Left: 46 sec. 03/21/24: Pt. Has difficulty efficiently manipulating small objects. Pt. 9 Hole Peg Test: R: 1 min. & 11 sec.  L: 46 sec. 11/09/2023: 9 Hole Peg test: Right: 1 min. & 22 sec; Left: 46 sec Eval: 9 Hole Peg test: Right: 1 min. & 46 sec; Left: 53 sec; 05/02/34: Right: 54 sec, Left: 43 sec  Goal status: achieved  7.  Pt. Will improve the Mam-20 score by 5 points to reflect ADL/IADL improvement. Baseline:03/23/2024: TBD; Eval: Mam-20 sum score: 53/80, Mam Measure score: 52.4; 05/02/24: 59/80 Goal status: achieved  8. Pt. will improve hand function skills to independently, and efficiently pick up, and place items (tv remotes/utensils/pens) into the proper position without using assistance from the left hand. Baseline: 03/23/2024: Pt. Requires assist from the left hand to position items within his right hand in preparation for using the item; 05/02/24: still requires use of L hand for repositioning items in the R hand.    Goal status: d/c  ASSESSMENT: CLINICAL IMPRESSION: Pt seen for OT d/c assessment this date per his request, as he plans on transitioning to working out at [employer].  Pt feels confident with the compensatory strategies/activity modifications reviewed in OT, as well as his HEP to promote BUE strength, flexibility, and coordination for daily tasks.  Pt has shown good progress with bilat hand coordination, bilat hand strength, R shoulder mobility, and handwriting legibility.  Pt has been encouraged to continue participation in his HEP in order to optimize BUE functioning and independence with ADL/IADL tasks.  Pt acknowledged understanding of all recommendations for d/c.     PERFORMANCE DEFICITS: in functional skills including ADLs, IADLs, coordination, dexterity, proprioception, sensation, ROM, strength, pain, Fine motor control, Gross motor control, and UE functional use, cognitive skills including , and psychosocial skills including coping strategies, environmental adaptation, and routines and behaviors.   IMPAIRMENTS: are limiting patient from ADLs, IADLs, and leisure.   CO-MORBIDITIES: may have co-morbidities  that affects occupational performance. Patient will benefit from skilled OT to address above impairments and improve overall function.  MODIFICATION OR ASSISTANCE TO COMPLETE EVALUATION:  Min-Moderate modification of tasks or assist with assess necessary to complete an evaluation.  OT OCCUPATIONAL PROFILE AND HISTORY: Detailed assessment: Review of records and additional review of physical, cognitive, psychosocial history related to current functional performance.  CLINICAL DECISION MAKING: Moderate - several treatment options, min-mod task modification necessary  REHAB POTENTIAL: Good  EVALUATION COMPLEXITY: Moderate  PLAN:  OT FREQUENCY: 2x/week  OT DURATION: 12 weeks  PLANNED INTERVENTIONS: 97168 OT Re-evaluation, 97535 self care/ADL training, 02889 therapeutic exercise, 97530 therapeutic activity, 97112 neuromuscular re-education, 97140 manual therapy, 97018 paraffin, 02989 moist heat, 97034 contrast bath, 97760 Orthotics management and training, 02239 Splinting (initial encounter), energy conservation, patient/family education, and DME and/or AE instructions  RECOMMENDED OTHER SERVICES: PT  CONSULTED AND AGREED WITH PLAN OF CARE: Patient  PLAN FOR NEXT SESSION: N/A; d/c this visit  Inocente Blazing, MS, OTR/L  05/02/2024, 8:06 PM

## 2024-05-02 NOTE — Therapy (Signed)
 OUTPATIENT PHYSICAL THERAPY TREATMENT/ Physical Therapy Progress Note/ DISCHARGE PT    Dates of reporting period  03/21/24   to   05/02/24   Patient Name: Daniel Daugherty MRN: [id number] DOB:[birth year], 58 y.o., male Today's Date: 05/02/2024  PCP: Odell Chard, Edra GRADE, MD  REFERRING PROVIDER: Odell Chard, Edra GRADE, MD   END OF SESSION:    PT End of Session - 05/02/24 1147     Visit Number 40    Number of Visits 48    Date for Recertification  05/23/24    Authorization Type Medicare 2025    Progress Note Due on Visit 40    PT Start Time 1113    PT Stop Time 1145    PT Time Calculation (min) 32 min    Equipment Utilized During Treatment Gait belt    Activity Tolerance Patient tolerated treatment well    Behavior During Therapy WFL for tasks assessed/performed           Past Medical History:  Diagnosis Date   Hypertension    Past Surgical History:  Procedure Laterality Date   DG HAND LEFT COMPLETE ([facility] HX) Left 04/13/1999   There are no active problems to display for this patient.  ONSET DATE: 2022 is when he noticed the change and that was around the same time he was diagnosed with Parkinson's  REFERRING DIAG:  G20.A1 (ICD-10-CM) - Parkinson disease (HCC)  Z74.1 (ICD-10-CM) - Requires daily assistance for activities of daily living (ADL) and comfort needs   THERAPY DIAG:  Generalized weakness  Difficulty in walking, not elsewhere classified  Unsteadiness on feet  Abnormality of gait  Muscle weakness (generalized)  Rationale for Evaluation and Treatment: Rehabilitation  SUBJECTIVE:                                                                                                                                                                                             SUBJECTIVE STATEMENT:   Pt reports he is ready for discharge today. Is comfortable going to [facility] to work on his exercises.   PERTINENT HISTORY: Parkinsonism, Vitamin D deficiency,  Hyperlipidemia, HTN, poor medication compliance (per chart review)  PAIN:  Are you having pain? Rt shoulder/neck 4-5/10.   PRECAUTIONS: Fall  RED FLAGS: None   WEIGHT BEARING RESTRICTIONS: No  FALLS: Has patient fallen in last 6 months? Yes. 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   PATIENT GOALS: Get my balance right with my walking, help get my R arm stronger and be able to move it more, improve strength in my legs (specifically the Right)  OBJECTIVE:  Note: Objective measures were completed at Evaluation unless otherwise noted.                                                                                                                             TREATMENT DATE: 05/02/24  Physical Performance Test or Measurement: a  physical performance test(s) or measurement (eg,  musculoskeletal, functional capacity), with written report,  each 15 mins   6 Min Walk Test:  Instructed patient to ambulate as quickly and as safely as possible for 6 minutes using LRAD. Patient was allowed to take standing rest breaks without stopping the test, but if the patient required a sitting rest break the clock would be stopped and the test would be over.  Results: 819 ft no AD, stooped posture and parkinsonian gait throughout but no LOB  Results indicate that the patient has reduced endurance with ambulation compared to age  matched norms.  Age Matched Norms (in meters): 50-69 yo M: 42 F: 74, 15-79 yo M: 89 F: 471, 56-89 yo M: 417 F: 392 MDC: 58.21 meters (190.98 feet) or 50 meters (ANPTA Core Set of Outcome Measures for Adults with Neurologic Conditions, 2018)  PT instructed pt in TUG: 9/30: 11.72 sec, no AD  sec ( >13.5 sec indicates increased fall risk)  10 Meter Walk Test: Patient instructed to walk 10 meters (32.8 ft) as quickly and as safely as possible at their normal speed Results: .97 m/s  m/s   Cut off scores:   Household Ambulator  < 0.4 m/s  Limited Community Ambulator  0.4 - 0.8 m/s  [employer]  > 0.8 m/s  Increased fall risk  < 1.33m/s  Crossing a Street  >1.52m/s  MCID 0.05 m/s (small), 0.13 m/s (moderate), 0.06 m/s (significant)  (ANPTA Core Set of Outcome Measures for Adults with Neurologic Conditions, 2018)     TA- To improve functional movements patterns for everyday tasks   Step up  no UE assist x 10 ea LE   PATIENT EDUCATION: Education details: exercise technique, reassessment  Person educated: Patient Education method: Explanation, demo, vc Education comprehension: verbalized understanding and needs further education  HOME EXERCISE PROGRAM:  Access Code: [email or web address] URL: [email or web address] Date: 12/02/2023 Prepared by: Connell Kiss  Exercises - Sidelying Thoracic Rotation with Open Book  - 1 x daily - 7 x weekly - 2 sets - 10 reps - Supine Shoulder External Rotation in Abduction  - 1 x daily -  7 x weekly - 2 sets - 10 reps - 10 seconds hold - Seated Scapular Retraction  - 1 x daily - 7 x weekly - 3 sets - 10 reps - 3 seconds hold - Sit to Stand with Arm Swing  - 1 x daily - 7 x weekly - 2 sets - 10 reps   GOALS: Goals reviewed with patient? Yes  SHORT TERM GOALS: Target date: 04/27/2024    Patient will be independent in home exercise program to improve strength/mobility for better functional independence with ADLs. Baseline: need  to initiate 10/07/2023: provided 12/02/2023: updated Goal status: IN PROGRESS   LONG TERM GOALS: Target date: 05/23/2024   1.  Patient (< 61 years old) will complete five times sit to stand test in < 12 seconds indicating an increased LE strength and improved balance. Baseline: 09/28/23: 38.53 seconds 11/09/2023: 22.13 seconds with arms across chest 01/04/2024: 20.12 seconds without UE support 03/21/24: 17.20 seconds without UE support  9/30: 16.55 sec no UE support  Goal status: IN PROGRESS   2. Patient will reduce timed up and go to <11 seconds to reduce fall risk and demonstrate improved transfer/gait ability. Baseline: 09/28/23: 19.46 seconds without AD 11/09/2023: 16.96 seconds, no AD, with close SBA for balance safety 01/04/2024: 15.845 seconds, no AD with close SBA for safety 03/21/24: 14.83 seconds, no AD with close SBA for safety  9/30: 11.72 sec, no AD  Goal status: IN PROGRESS  3.  Patient will increase 10 meter walk test to >1.3m/s as to improve gait speed for better community ambulation and to reduce fall risk. Baseline: 09/28/23: 0.93m/s without AD 11/09/2023: 0.89 m/s (11.50 seconds seconds 10.94 seconds) no AD, with close SBA for safety 01/04/2024: 0.935 m/s without AD, CGA/close SBA for safety 03/21/24: 0.8 m/s without AD, CGA/close SBA for safety 9/30: .97 m/s  Goal status: IN PROGRESS  4.  Patient will increase six minute walk test distance to >1077ft for progression to community ambulator and improve gait ability Baseline: 10/04/2023: 566ft (138m at 0.44m/s avg) 11/09/2023: 1061 feet (323 meters, Avg speed 0.897 m/s) without AD Goal status: MET and Advanced, ONGOING with updated goal 11/09/2023 Goal Updated: distance > 1369ft 01/13/2024: 832  ft mostly without SPC but then pt needed SPC due to fatigue, required one break (standing) 03/21/24: 750' with mostly SPC due to fatigue  9/30: 819 ft no AD, stooped posture and parkinsonian gait throughout but no LOB    5. Patient will increase  MiniBest Test score to >18/28 to indicate a reduced risk for falling and demonstrate increased independence with functional mobility and ADLs.  Baseline: 10/04/2023: 8/28  11/16/2023: 16/28  01/22/2024: deferred 6/17: 20/28 03/21/24: 17/28  Goal status: Re-instated due to score below goal   6. Patient will increase ABC scale score >80% to demonstrate better functional mobility and better confidence with ADLs.   Baseline: 09/28/23: 17.5% 11/09/2023: 21.25% - pt reports he rated the test as is if he was not using his walker because this is his long term goal  01/04/2024: 35.625% - continuing to rate it as if he is not using his walker 03/21/24: 23% - rates as if he is not using SPC   Goal status: IN PROGRESS   ASSESSMENT:  CLINICAL IMPRESSION:   Pt presents for progress note and discharge PT today. Pt has made significant progress towards all his LTG. Pt is comfortable with HEP and okay with discharge to go to [facility] for maintenance. Pt to be discharged from therapy this  date.   OBJECTIVE IMPAIRMENTS: Abnormal gait, decreased activity tolerance, decreased balance, decreased coordination, decreased endurance, decreased knowledge of condition, decreased knowledge of use of DME, decreased mobility, difficulty walking, decreased ROM, decreased strength, decreased safety awareness, improper body mechanics, postural dysfunction, and pain.   ACTIVITY LIMITATIONS: carrying, lifting, bending, standing, squatting, sleeping, stairs, transfers, bed mobility, continence, bathing, toileting, dressing, reach over head, hygiene/grooming, and locomotion level  PARTICIPATION LIMITATIONS: meal prep, cleaning, laundry, and community activity  PERSONAL FACTORS: Fitness and 3+ comorbidities: Vitamin D deficiency, Hyperlipidemia, and HTN are also affecting patient's functional outcome.   REHAB POTENTIAL: Good  CLINICAL DECISION MAKING: Evolving/moderate complexity  EVALUATION COMPLEXITY: Moderate  PLAN:  PT  FREQUENCY: 1-2x/week  PT DURATION: 12 weeks  PLANNED INTERVENTIONS: 97164- PT Re-evaluation, 97110-Therapeutic exercises, 97530- Therapeutic activity, 97112- Neuromuscular re-education, 97535- Self Care, [id number]- Manual therapy, [phone]- Gait training, [phone]- Orthotic Fit/training, [phone]- Canalith repositioning, [phone]- Electrical stimulation (manual), Patient/Family education, Balance training, Stair training, Joint mobilization, Vestibular training, Visual/preceptual remediation/compensation, DME instructions, Cryotherapy, and Moist heat  PLAN FOR NEXT SESSION:  D/C today     Lonni KATHEE Gainer PT  Physical Therapist- [place]  [facility]   05/02/24, 11:48 AM

## 2024-05-04 ENCOUNTER — Ambulatory Visit

## 2024-05-09 ENCOUNTER — Ambulatory Visit

## 2024-05-09 ENCOUNTER — Ambulatory Visit: Admitting: Physical Therapy

## 2024-05-11 ENCOUNTER — Ambulatory Visit

## 2024-05-16 ENCOUNTER — Ambulatory Visit: Admitting: Occupational Therapy

## 2024-05-16 ENCOUNTER — Ambulatory Visit: Admitting: Physical Therapy

## 2024-05-18 ENCOUNTER — Ambulatory Visit

## 2024-05-23 ENCOUNTER — Ambulatory Visit: Admitting: Occupational Therapy

## 2024-05-23 ENCOUNTER — Ambulatory Visit

## 2024-05-25 ENCOUNTER — Ambulatory Visit

## 2024-05-30 ENCOUNTER — Encounter: Admitting: Occupational Therapy

## 2024-05-30 ENCOUNTER — Ambulatory Visit: Admitting: Physical Therapy

## 2024-06-01 ENCOUNTER — Encounter

## 2024-06-01 ENCOUNTER — Ambulatory Visit: Admitting: Physical Therapy

## 2024-06-06 ENCOUNTER — Ambulatory Visit: Admitting: Physical Therapy

## 2024-06-06 ENCOUNTER — Encounter: Admitting: Occupational Therapy

## 2024-06-08 ENCOUNTER — Encounter

## 2024-06-08 ENCOUNTER — Ambulatory Visit: Admitting: Physical Therapy

## 2024-06-13 ENCOUNTER — Encounter: Admitting: Occupational Therapy

## 2024-06-13 ENCOUNTER — Ambulatory Visit: Admitting: Physical Therapy

## 2024-06-15 ENCOUNTER — Encounter

## 2024-06-15 ENCOUNTER — Ambulatory Visit: Admitting: Physical Therapy

## 2024-06-20 ENCOUNTER — Encounter: Admitting: Occupational Therapy

## 2024-06-20 ENCOUNTER — Ambulatory Visit: Admitting: Physical Therapy

## 2024-06-22 ENCOUNTER — Ambulatory Visit: Admitting: Physical Therapy

## 2024-06-22 ENCOUNTER — Encounter

## 2024-06-27 ENCOUNTER — Ambulatory Visit: Admitting: Physical Therapy

## 2024-06-27 ENCOUNTER — Encounter: Admitting: Occupational Therapy

## 2024-07-04 ENCOUNTER — Ambulatory Visit: Admitting: Physical Therapy

## 2024-07-04 ENCOUNTER — Encounter: Admitting: Occupational Therapy

## 2024-07-06 ENCOUNTER — Encounter

## 2024-07-06 ENCOUNTER — Ambulatory Visit: Admitting: Physical Therapy

## 2024-07-11 ENCOUNTER — Encounter: Admitting: Occupational Therapy

## 2024-07-11 ENCOUNTER — Ambulatory Visit: Admitting: Physical Therapy

## 2024-07-13 ENCOUNTER — Encounter

## 2024-07-13 ENCOUNTER — Ambulatory Visit: Admitting: Physical Therapy

## 2024-07-18 ENCOUNTER — Ambulatory Visit: Admitting: Physical Therapy

## 2024-07-18 ENCOUNTER — Encounter: Admitting: Occupational Therapy

## 2024-07-20 ENCOUNTER — Ambulatory Visit: Admitting: Physical Therapy

## 2024-07-20 ENCOUNTER — Encounter

## 2024-07-25 ENCOUNTER — Encounter: Admitting: Occupational Therapy

## 2024-07-25 ENCOUNTER — Ambulatory Visit: Admitting: Physical Therapy

## 2024-08-01 ENCOUNTER — Encounter: Admitting: Occupational Therapy

## 2024-08-01 ENCOUNTER — Ambulatory Visit: Admitting: Physical Therapy

## 2024-08-08 ENCOUNTER — Ambulatory Visit: Admitting: Physical Therapy

## 2024-08-08 ENCOUNTER — Encounter: Admitting: Occupational Therapy

## 2024-08-10 ENCOUNTER — Ambulatory Visit: Admitting: Physical Therapy

## 2024-08-10 ENCOUNTER — Encounter

## 2024-08-15 ENCOUNTER — Ambulatory Visit: Admitting: Physical Therapy

## 2024-08-15 ENCOUNTER — Encounter: Admitting: Occupational Therapy

## 2024-08-17 ENCOUNTER — Ambulatory Visit: Admitting: Physical Therapy

## 2024-08-17 ENCOUNTER — Encounter: Admitting: Occupational Therapy

## 2024-08-22 ENCOUNTER — Encounter: Admitting: Occupational Therapy

## 2024-08-22 ENCOUNTER — Ambulatory Visit: Admitting: Physical Therapy

## 2024-08-24 ENCOUNTER — Ambulatory Visit: Admitting: Physical Therapy

## 2024-08-24 ENCOUNTER — Encounter: Admitting: Occupational Therapy
